# Patient Record
Sex: Male | Born: 1950 | Race: White | Hispanic: No | Marital: Married | Smoking: Current every day smoker
Health system: Southern US, Community
[De-identification: ages and names within clinical notes are randomized; demographics above are authoritative.]

## PROBLEM LIST (undated history)

## (undated) DIAGNOSIS — I10 Essential (primary) hypertension: Secondary | ICD-10-CM

## (undated) DIAGNOSIS — J45909 Unspecified asthma, uncomplicated: Secondary | ICD-10-CM

## (undated) DIAGNOSIS — M109 Gout, unspecified: Secondary | ICD-10-CM

## (undated) DIAGNOSIS — F32A Depression, unspecified: Secondary | ICD-10-CM

## (undated) DIAGNOSIS — F329 Major depressive disorder, single episode, unspecified: Secondary | ICD-10-CM

## (undated) DIAGNOSIS — F419 Anxiety disorder, unspecified: Secondary | ICD-10-CM

## (undated) HISTORY — DX: Depression, unspecified: F32.A

## (undated) HISTORY — DX: Essential (primary) hypertension: I10

## (undated) HISTORY — DX: Anxiety disorder, unspecified: F41.9

## (undated) HISTORY — DX: Unspecified asthma, uncomplicated: J45.909

## (undated) HISTORY — PX: APPENDECTOMY: SHX54

## (undated) HISTORY — DX: Major depressive disorder, single episode, unspecified: F32.9

## (undated) HISTORY — PX: TONSILLECTOMY: SUR1361

## (undated) HISTORY — DX: Gout, unspecified: M10.9

---

## 2001-10-25 ENCOUNTER — Encounter: Payer: Self-pay | Admitting: Family Medicine

## 2001-10-25 ENCOUNTER — Encounter: Admission: RE | Admit: 2001-10-25 | Discharge: 2001-10-25 | Payer: Self-pay | Admitting: Family Medicine

## 2002-09-09 ENCOUNTER — Emergency Department (HOSPITAL_COMMUNITY): Admission: EM | Admit: 2002-09-09 | Discharge: 2002-09-09 | Payer: Self-pay | Admitting: Emergency Medicine

## 2002-09-09 ENCOUNTER — Encounter: Payer: Self-pay | Admitting: Emergency Medicine

## 2002-12-23 ENCOUNTER — Emergency Department (HOSPITAL_COMMUNITY): Admission: EM | Admit: 2002-12-23 | Discharge: 2002-12-23 | Payer: Self-pay | Admitting: Emergency Medicine

## 2002-12-23 ENCOUNTER — Encounter: Payer: Self-pay | Admitting: Emergency Medicine

## 2003-02-05 ENCOUNTER — Ambulatory Visit (HOSPITAL_COMMUNITY): Admission: RE | Admit: 2003-02-05 | Discharge: 2003-02-05 | Payer: Self-pay | Admitting: Podiatry

## 2003-02-05 ENCOUNTER — Encounter: Payer: Self-pay | Admitting: Podiatry

## 2003-12-10 ENCOUNTER — Other Ambulatory Visit: Payer: Self-pay

## 2009-08-31 ENCOUNTER — Inpatient Hospital Stay: Payer: Self-pay | Admitting: Internal Medicine

## 2010-03-10 ENCOUNTER — Ambulatory Visit: Payer: Self-pay | Admitting: Ophthalmology

## 2010-03-24 ENCOUNTER — Ambulatory Visit: Payer: Self-pay | Admitting: Ophthalmology

## 2011-04-29 ENCOUNTER — Ambulatory Visit: Payer: Self-pay | Admitting: Family Medicine

## 2011-11-17 ENCOUNTER — Ambulatory Visit: Payer: Self-pay | Admitting: Family Medicine

## 2012-09-08 ENCOUNTER — Telehealth: Payer: Self-pay | Admitting: Internal Medicine

## 2012-09-20 NOTE — Telephone Encounter (Signed)
x

## 2015-04-21 ENCOUNTER — Ambulatory Visit: Payer: Self-pay | Admitting: Family Medicine

## 2015-04-24 ENCOUNTER — Ambulatory Visit (INDEPENDENT_AMBULATORY_CARE_PROVIDER_SITE_OTHER): Payer: BLUE CROSS/BLUE SHIELD | Admitting: Family Medicine

## 2015-04-24 ENCOUNTER — Encounter: Payer: Self-pay | Admitting: Family Medicine

## 2015-04-24 VITALS — BP 136/94 | HR 102 | Temp 98.2°F | Resp 18 | Ht 71.0 in | Wt 272.2 lb

## 2015-04-24 DIAGNOSIS — I10 Essential (primary) hypertension: Secondary | ICD-10-CM | POA: Diagnosis not present

## 2015-04-24 NOTE — Patient Instructions (Signed)
DASH Eating Plan °DASH stands for "Dietary Approaches to Stop Hypertension." The DASH eating plan is a healthy eating plan that has been shown to reduce high blood pressure (hypertension). Additional health benefits may include reducing the risk of type 2 diabetes mellitus, heart disease, and stroke. The DASH eating plan may also help with weight loss. °WHAT DO I NEED TO KNOW ABOUT THE DASH EATING PLAN? °For the DASH eating plan, you will follow these general guidelines: °· Choose foods with a percent daily value for sodium of less than 5% (as listed on the food label). °· Use salt-free seasonings or herbs instead of table salt or sea salt. °· Check with your health care provider or pharmacist before using salt substitutes. °· Eat lower-sodium products, often labeled as "lower sodium" or "no salt added." °· Eat fresh foods. °· Eat more vegetables, fruits, and low-fat dairy products. °· Choose whole grains. Look for the word "whole" as the first word in the ingredient list. °· Choose fish and skinless chicken or turkey more often than red meat. Limit fish, poultry, and meat to 6 oz (170 g) each day. °· Limit sweets, desserts, sugars, and sugary drinks. °· Choose heart-healthy fats. °· Limit cheese to 1 oz (28 g) per day. °· Eat more home-cooked food and less restaurant, buffet, and fast food. °· Limit fried foods. °· Cook foods using methods other than frying. °· Limit canned vegetables. If you do use them, rinse them well to decrease the sodium. °· When eating at a restaurant, ask that your food be prepared with less salt, or no salt if possible. °WHAT FOODS CAN I EAT? °Seek help from a dietitian for individual calorie needs. °Grains °Whole grain or whole wheat bread. Brown rice. Whole grain or whole wheat pasta. Quinoa, bulgur, and whole grain cereals. Low-sodium cereals. Corn or whole wheat flour tortillas. Whole grain cornbread. Whole grain crackers. Low-sodium crackers. °Vegetables °Fresh or frozen vegetables  (raw, steamed, roasted, or grilled). Low-sodium or reduced-sodium tomato and vegetable juices. Low-sodium or reduced-sodium tomato sauce and paste. Low-sodium or reduced-sodium canned vegetables.  °Fruits °All fresh, canned (in natural juice), or frozen fruits. °Meat and Other Protein Products °Ground beef (85% or leaner), grass-fed beef, or beef trimmed of fat. Skinless chicken or turkey. Ground chicken or turkey. Pork trimmed of fat. All fish and seafood. Eggs. Dried beans, peas, or lentils. Unsalted nuts and seeds. Unsalted canned beans. °Dairy °Low-fat dairy products, such as skim or 1% milk, 2% or reduced-fat cheeses, low-fat ricotta or cottage cheese, or plain low-fat yogurt. Low-sodium or reduced-sodium cheeses. °Fats and Oils °Tub margarines without trans fats. Light or reduced-fat mayonnaise and salad dressings (reduced sodium). Avocado. Safflower, olive, or canola oils. Natural peanut or almond butter. °Other °Unsalted popcorn and pretzels. °The items listed above may not be a complete list of recommended foods or beverages. Contact your dietitian for more options. °WHAT FOODS ARE NOT RECOMMENDED? °Grains °White bread. White pasta. White rice. Refined cornbread. Bagels and croissants. Crackers that contain trans fat. °Vegetables °Creamed or fried vegetables. Vegetables in a cheese sauce. Regular canned vegetables. Regular canned tomato sauce and paste. Regular tomato and vegetable juices. °Fruits °Dried fruits. Canned fruit in light or heavy syrup. Fruit juice. °Meat and Other Protein Products °Fatty cuts of meat. Ribs, chicken wings, bacon, sausage, bologna, salami, chitterlings, fatback, hot dogs, bratwurst, and packaged luncheon meats. Salted nuts and seeds. Canned beans with salt. °Dairy °Whole or 2% milk, cream, half-and-half, and cream cheese. Whole-fat or sweetened yogurt. Full-fat   cheeses or blue cheese. Nondairy creamers and whipped toppings. Processed cheese, cheese spreads, or cheese  curds. °Condiments °Onion and garlic salt, seasoned salt, table salt, and sea salt. Canned and packaged gravies. Worcestershire sauce. Tartar sauce. Barbecue sauce. Teriyaki sauce. Soy sauce, including reduced sodium. Steak sauce. Fish sauce. Oyster sauce. Cocktail sauce. Horseradish. Ketchup and mustard. Meat flavorings and tenderizers. Bouillon cubes. Hot sauce. Tabasco sauce. Marinades. Taco seasonings. Relishes. °Fats and Oils °Butter, stick margarine, lard, shortening, ghee, and bacon fat. Coconut, palm kernel, or palm oils. Regular salad dressings. °Other °Pickles and olives. Salted popcorn and pretzels. °The items listed above may not be a complete list of foods and beverages to avoid. Contact your dietitian for more information. °WHERE CAN I FIND MORE INFORMATION? °[employer], [employer], [employer]: www.nhlbi.nih.gov/health/health-topics/topics/dash/ °Document Released: 10/14/2011 Document Revised: 03/11/2014 Document Reviewed: 08/29/2013 °Exit[employer]® Patient Information ©2015 [employer]. This information is not intended to replace advice given to you by your health care provider. Make sure you discuss any questions you have with your health care provider. ° °

## 2015-04-24 NOTE — Progress Notes (Signed)
BP 136/94 mmHg  Pulse 102  Temp(Src) 98.2 F (36.8 C)  Resp 18  Ht  (1.803 m)  Wt 272 lb 3.2 oz (123.469 kg)  BMI 37.98 kg/m2  SpO2 94%   Subjective:    Patient ID: Arthur Conner, male    DOB: May 05, 1951, 64 y.o.   MRN: [id number]  HPI: Arthur Conner is a 64 y.o. male  Chief Complaint  Patient presents with  . Hypertension    Patient states that blood pressure has been running 155/94. Patient states that he has recently gained 25lbs back.   HYPERTENSION- started increasing BP a couple of weeks ago, had some pain in his shoulder around that time. He was not taking any NSAIDs. No extra stress, no sudafed. Has gained about 25 pounds recently following dealing with his mother's estate. He also notes that he has not been monitoring his sodium intake and has been eating whatever he wants.  Hypertension status: exacerbated Satisfied with current treatment? no Duration of hypertension: chronic BP monitoring frequency:  daily BP range: 140s-150s BP medication side effects:  no Medication compliance: excellent compliance Aspirin: no Recurrent headaches: no Visual changes: no Palpitations: no Dyspnea: no Chest pain: no Lower extremity edema: no Dizzy/lightheaded: no  Relevant past medical, surgical, family and social history reviewed and updated as indicated. Interim medical history since our last visit reviewed. Allergies and medications reviewed and updated.  Review of Systems  Constitutional: Negative.   Respiratory: Negative.   Cardiovascular: Negative.   Skin: Negative.   Neurological: Negative.   Psychiatric/Behavioral: Negative.    Per HPI unless specifically indicated above     Objective:    BP 136/94 mmHg  Pulse 102  Temp(Src) 98.2 F (36.8 C)  Resp 18  Ht  (1.803 m)  Wt 272 lb 3.2 oz (123.469 kg)  BMI 37.98 kg/m2  SpO2 94%  Wt Readings from Last 3 Encounters:  04/24/15 272 lb 3.2 oz (123.469 kg)  09/20/14 264 lb (119.75 kg)     Physical Exam  Constitutional: He appears well-developed and well-nourished.  HENT:  Head: Normocephalic and atraumatic.  Neck: Normal range of motion. Neck supple.  No bruits  Cardiovascular: Normal rate, regular Arthur and normal heart sounds.  Exam reveals no gallop and no friction rub.   No murmur heard. Pulmonary/Chest: Effort normal and breath sounds normal. No respiratory distress. He has no wheezes. He has no rales. He exhibits no tenderness.  Skin: Skin is warm and dry.  Psychiatric: He has a normal mood and affect. His behavior is normal. Judgment and thought content normal.  Nursing note and vitals reviewed.   No results found for this or any previous visit.    Assessment & Plan:   Problem List Items Addressed This Visit      Cardiovascular and Mediastinum   HTN (hypertension) - Primary    Has been increasing recently, but has gained 25lbs and has not been watching his sodium intake. Will work on watching his diet and losing weight. Will recheck BP in 1 month to see how he is doing prior to adjusting medication. Discussed with patient the importance of calling if BP going higher when he continues to monitor. Will hold on labwork at this time as he doesn't think his thyroid is out of whack, but rather that he just hasn't been careful lately.           Follow up plan: Return in about 4 weeks (around 05/22/2015) for for BP  follow up with [facility].

## 2015-04-24 NOTE — Assessment & Plan Note (Signed)
Has been increasing recently, but has gained 25lbs and has not been watching his sodium intake. Will work on watching his diet and losing weight. Will recheck BP in 1 month to see how he is doing prior to adjusting medication. Discussed with patient the importance of calling if BP going higher when he continues to monitor. Will hold on labwork at this time as he doesn't think his thyroid is out of whack, but rather that he just hasn't been careful lately.

## 2015-05-05 ENCOUNTER — Other Ambulatory Visit: Payer: Self-pay | Admitting: Family Medicine

## 2015-05-27 ENCOUNTER — Ambulatory Visit: Payer: BLUE CROSS/BLUE SHIELD | Admitting: Family Medicine

## 2015-06-02 ENCOUNTER — Other Ambulatory Visit: Payer: Self-pay | Admitting: Family Medicine

## 2015-07-24 ENCOUNTER — Other Ambulatory Visit: Payer: Self-pay | Admitting: Family Medicine

## 2015-07-29 ENCOUNTER — Encounter: Payer: Self-pay | Admitting: Family Medicine

## 2015-07-29 ENCOUNTER — Ambulatory Visit (INDEPENDENT_AMBULATORY_CARE_PROVIDER_SITE_OTHER): Payer: BLUE CROSS/BLUE SHIELD | Admitting: Family Medicine

## 2015-07-29 VITALS — BP 158/103 | HR 89 | Temp 99.2°F | Wt 281.0 lb

## 2015-07-29 DIAGNOSIS — I10 Essential (primary) hypertension: Secondary | ICD-10-CM

## 2015-07-29 DIAGNOSIS — J45901 Unspecified asthma with (acute) exacerbation: Secondary | ICD-10-CM

## 2015-07-29 MED ORDER — ALBUTEROL SULFATE HFA 108 (90 BASE) MCG/ACT IN AERS: 2.0000 | INHALATION_SPRAY | Freq: Four times a day (QID) | RESPIRATORY_TRACT | Status: DC | PRN

## 2015-07-29 MED ORDER — OLMESARTAN MEDOXOMIL 40 MG PO TABS: 40.0000 mg | ORAL_TABLET | Freq: Every day | ORAL | Status: DC

## 2015-07-29 MED ORDER — AMLODIPINE BESYLATE 10 MG PO TABS: 10.0000 mg | ORAL_TABLET | Freq: Every day | ORAL | Status: DC

## 2015-07-29 MED ORDER — HYDROCHLOROTHIAZIDE 25 MG PO TABS: 25.0000 mg | ORAL_TABLET | Freq: Every day | ORAL | Status: DC

## 2015-07-29 MED ORDER — BUDESONIDE-FORMOTEROL FUMARATE 160-4.5 MCG/ACT IN AERO: 2.0000 | INHALATION_SPRAY | Freq: Two times a day (BID) | RESPIRATORY_TRACT | Status: DC

## 2015-07-29 MED ORDER — LEVOFLOXACIN 750 MG PO TABS: 750.0000 mg | ORAL_TABLET | Freq: Every day | ORAL | Status: DC

## 2015-07-29 MED ORDER — PREDNISONE 10 MG PO TABS: ORAL_TABLET | ORAL | Status: DC

## 2015-07-29 NOTE — Assessment & Plan Note (Signed)
Likely up due to work of breathing. Rx for tribenzor broken up due to cost.

## 2015-07-29 NOTE — Progress Notes (Signed)
BP 158/103 mmHg  Pulse 89  Temp(Src) 99.2 F (37.3 C)  Wt 281 lb (127.461 kg)  SpO2 97%   Subjective:    Patient ID: Arthur Conner, male    DOB: 11/13/1950, 64 y.o.   MRN: [id number]  HPI: Arthur Conner is a 64 y.o. male  Chief Complaint  Patient presents with  . Asthma    patient states that he is having an asthma flare, he had some prednisone that [facility] had written that he did not take, so he has been using that, he took the last 1/2 pill this morning.He states that he needs a refill on proventil and either Symbicort or Advair   ASTHMA- has been sick for 2 weeks, finished a prednisone taper this morning, feeling a little better, but not much, wheezing when he lays down. Has been out of his advair/symbicort for unknown amount of time. Has nevre had symbicort for long periods of time, but took an old dose of his mothers Asthma status: exacerbated Satisfied with current treatment?: no Albuterol/rescue inhaler frequency: daily  Dyspnea frequency: all the time Wheezing frequency: nightly Cough frequency: occasionally Nocturnal symptom frequency: nightly Limitation of activity: yes Current upper respiratory symptoms: yes Triggers: allergies, uknown Home peak flows: no Last Spirometry: refused today Failed/intolerant to following asthma meds: none Asthma meds in past: advair Aerochamber/spacer use: no Visits to ER or Urgent Care in past year: no Pneumovax: Up to Date Influenza: postponed until better   Relevant past medical, surgical, family and social history reviewed and updated as indicated. Interim medical history since our last visit reviewed. Allergies and medications reviewed and updated.  Review of Systems  Constitutional: Negative.   HENT: Negative.   Respiratory: Positive for cough, chest tightness, shortness of breath and wheezing. Negative for apnea, choking and stridor.   Cardiovascular: Negative for chest pain, palpitations and leg swelling.   Psychiatric/Behavioral: Negative.     Per HPI unless specifically indicated above     Objective:    BP 158/103 mmHg  Pulse 89  Temp(Src) 99.2 F (37.3 C)  Wt 281 lb (127.461 kg)  SpO2 97%  Wt Readings from Last 3 Encounters:  07/29/15 281 lb (127.461 kg)  04/24/15 272 lb 3.2 oz (123.469 kg)  09/20/14 264 lb (119.75 kg)    Physical Exam  Constitutional: He is oriented to person, place, and time. He appears well-developed and well-nourished. No distress.  HENT:  Head: Normocephalic and atraumatic.  Right Ear: Hearing normal.  Left Ear: Hearing normal.  Nose: Nose normal.  Eyes: Conjunctivae and lids are normal. Right eye exhibits no discharge. Left eye exhibits no discharge. No scleral icterus.  Cardiovascular: Normal rate, regular rhythm, normal heart sounds and intact distal pulses.  Exam reveals no gallop and no friction rub.   No murmur heard. Pulmonary/Chest: Effort normal. No respiratory distress. He has decreased breath sounds. He has wheezes in the right upper field, the right middle field, the right lower field, the left upper field, the left middle field and the left lower field. He has rhonchi in the right upper field, the right middle field, the right lower field, the left upper field, the left middle field and the left lower field. He has no rales. He exhibits no tenderness.  Musculoskeletal: Normal range of motion.  Neurological: He is alert and oriented to person, place, and time.  Skin: Skin is intact. No rash noted.  Psychiatric: He has a normal mood and affect. His speech is normal and behavior is  normal. Judgment and thought content normal. Cognition and memory are normal.  Nursing note and vitals reviewed.   No results found for this or any previous visit.    Assessment & Plan:   Problem List Items Addressed This Visit      Cardiovascular and Mediastinum   HTN (hypertension)    Likely up due to work of breathing. Rx for tribenzor broken up due to cost.        Relevant Medications   olmesartan (BENICAR) 40 MG tablet   amLODipine (NORVASC) 10 MG tablet   hydrochlorothiazide (HYDRODIURIL) 25 MG tablet     Respiratory   Asthma exacerbation - Primary    Not doing well. 14 days of levaquin. 12 day taper of prednisone as he already completed one. Refused neb and spirometry today. Refills on his inhalers given today. Recheck on lungs in 2 weeks.       Relevant Medications   budesonide-formoterol (SYMBICORT) 160-4.5 MCG/ACT inhaler   albuterol (PROVENTIL HFA;VENTOLIN HFA) 108 (90 BASE) MCG/ACT inhaler   predniSONE (DELTASONE) 10 MG tablet       Follow up plan: Return in about 2 weeks (around 08/12/2015).

## 2015-07-29 NOTE — Assessment & Plan Note (Signed)
Not doing well. 14 days of levaquin. 12 day taper of prednisone as he already completed one. Refused neb and spirometry today. Refills on his inhalers given today. Recheck on lungs in 2 weeks.

## 2015-08-12 ENCOUNTER — Ambulatory Visit (INDEPENDENT_AMBULATORY_CARE_PROVIDER_SITE_OTHER): Payer: BLUE CROSS/BLUE SHIELD | Admitting: Family Medicine

## 2015-08-12 ENCOUNTER — Encounter: Payer: Self-pay | Admitting: Family Medicine

## 2015-08-12 VITALS — BP 136/86 | HR 96 | Temp 98.5°F | Ht 71.0 in | Wt 286.0 lb

## 2015-08-12 DIAGNOSIS — I10 Essential (primary) hypertension: Secondary | ICD-10-CM | POA: Diagnosis not present

## 2015-08-12 DIAGNOSIS — E039 Hypothyroidism, unspecified: Secondary | ICD-10-CM | POA: Diagnosis not present

## 2015-08-12 DIAGNOSIS — J45901 Unspecified asthma with (acute) exacerbation: Secondary | ICD-10-CM | POA: Diagnosis not present

## 2015-08-12 DIAGNOSIS — Z23 Encounter for immunization: Secondary | ICD-10-CM | POA: Diagnosis not present

## 2015-08-12 MED ORDER — LOSARTAN POTASSIUM 100 MG PO TABS: 100.0000 mg | ORAL_TABLET | Freq: Every day | ORAL | Status: DC

## 2015-08-12 NOTE — Assessment & Plan Note (Addendum)
For blood pressure we'll discontinue Benicar start losartan Will recheck in 1 month to assess results and change Will need to check BMP also

## 2015-08-12 NOTE — Assessment & Plan Note (Signed)
Resolving with meds needs refills Stay of cigs and etoh as pt is doing

## 2015-08-12 NOTE — Progress Notes (Signed)
BP 136/86 mmHg  Pulse 96  Temp(Src) 98.5 F (36.9 C)  Ht  (1.803 m)  Wt 286 lb (129.729 kg)  BMI 39.91 kg/m2  SpO2 96%   Subjective:    Patient ID: Arthur Conner, male    DOB: [birth year], 64 y.o.   MRN: [id number]  HPI: Arthur Conner is a 64 y.o. male  Chief Complaint  Patient presents with  . Asthma   patient was stormy course over the last month with coughing and wheezing to prednisone and antibiotics to finally get better. Now doing better Needing refill on medications Still coughing some no real wheezing On review patient not drinking alcohol or smoking. Concerned about having taken Levaquin and side effects of patient with no signs or symptoms.   Relevant past medical, surgical, family and social history reviewed and updated as indicated. Interim medical history since our last visit reviewed. Allergies and medications reviewed and updated.  Blood pressures been doing well with medications No side effects from medications takes faithfully  Review of Systems  Constitutional: Negative.   Respiratory: Positive for cough. Negative for apnea, choking, chest tightness, shortness of breath and wheezing.   Cardiovascular: Negative.     Per HPI unless specifically indicated above     Objective:    BP 136/86 mmHg  Pulse 96  Temp(Src) 98.5 F (36.9 C)  Ht  (1.803 m)  Wt 286 lb (129.729 kg)  BMI 39.91 kg/m2  SpO2 96%  Wt Readings from Last 3 Encounters:  08/12/15 286 lb (129.729 kg)  07/29/15 281 lb (127.461 kg)  04/24/15 272 lb 3.2 oz (123.469 kg)    Physical Exam  Constitutional: He is oriented to person, place, and time. He appears well-developed and well-nourished. No distress.  HENT:  Head: Normocephalic and atraumatic.  Right Ear: Hearing normal.  Left Ear: Hearing normal.  Nose: Nose normal.  Eyes: Conjunctivae and lids are normal. Right eye exhibits no discharge. Left eye exhibits no discharge. No scleral icterus.  Cardiovascular:  Normal rate, regular rhythm and normal heart sounds.   Pulmonary/Chest: Effort normal. No respiratory distress.  Some rhonchi no rales or wheeze  Musculoskeletal: Normal range of motion.  Neurological: He is alert and oriented to person, place, and time.  Skin: Skin is intact. No rash noted.  Psychiatric: He has a normal mood and affect. His speech is normal and behavior is normal. Judgment and thought content normal. Cognition and memory are normal.    No results found for this or any previous visit.    Assessment & Plan:   Problem List Items Addressed This Visit      Cardiovascular and Mediastinum   Essential hypertension    For blood pressure we'll discontinue Benicar start losartan Will recheck in 1 month to assess results and change Will need to check BMP also       Relevant Medications   losartan (COZAAR) 100 MG tablet     Respiratory   Asthma exacerbation    Resolving with meds needs refills Stay of cigs and etoh as pt is doing        Endocrine   Hypothyroidism    Other Visit Diagnoses    Immunization due    -  Primary    Relevant Orders    Flu Vaccine QUAD 36+ mos PF IM (Fluarix & Fluzone Quad PF) (Completed)        Follow up plan: Return in about 4 weeks (around 09/09/2015) for Blood pressure check, BMP,  EKG.

## 2015-09-06 ENCOUNTER — Other Ambulatory Visit: Payer: Self-pay | Admitting: Family Medicine

## 2015-09-08 NOTE — Telephone Encounter (Signed)
Your patient 

## 2015-09-09 ENCOUNTER — Ambulatory Visit: Payer: BLUE CROSS/BLUE SHIELD | Admitting: Family Medicine

## 2015-09-10 ENCOUNTER — Encounter: Payer: Self-pay | Admitting: Family Medicine

## 2015-09-10 ENCOUNTER — Telehealth: Payer: Self-pay | Admitting: Family Medicine

## 2015-09-10 ENCOUNTER — Ambulatory Visit (INDEPENDENT_AMBULATORY_CARE_PROVIDER_SITE_OTHER): Payer: BLUE CROSS/BLUE SHIELD | Admitting: Family Medicine

## 2015-09-10 VITALS — BP 133/87 | HR 90 | Temp 98.6°F | Ht 72.6 in | Wt 287.0 lb

## 2015-09-10 DIAGNOSIS — J45901 Unspecified asthma with (acute) exacerbation: Secondary | ICD-10-CM | POA: Diagnosis not present

## 2015-09-10 DIAGNOSIS — I10 Essential (primary) hypertension: Secondary | ICD-10-CM | POA: Diagnosis not present

## 2015-09-10 MED ORDER — LOSARTAN POTASSIUM 100 MG PO TABS: 100.0000 mg | ORAL_TABLET | Freq: Every day | ORAL | Status: DC

## 2015-09-10 MED ORDER — UMECLIDINIUM-VILANTEROL 62.5-25 MCG/INH IN AEPB: 1.0000 | INHALATION_SPRAY | Freq: Every day | RESPIRATORY_TRACT | Status: DC

## 2015-09-10 NOTE — Telephone Encounter (Signed)
Call [facility] and discontinue Symbicort. I thought the patient understood I was replacing his Symbicort with Anoro

## 2015-09-10 NOTE — Telephone Encounter (Signed)
anoro and symbicort aren't generally taken together and the pharmacy would like clarification.

## 2015-09-10 NOTE — Progress Notes (Signed)
   BP 133/87 mmHg  Pulse 90  Temp(Src) 98.6 F (37 C)  Ht 6' 0.6" (1.844 m)  Wt 287 lb (130.182 kg)  BMI 38.29 kg/m2  SpO2 96%   Subjective:    Patient ID: Arthur Conner, male    DOB: [birth year], 64 y.o.   MRN: [id number]  HPI: Arthur Conner is a 64 y.o. male  Chief Complaint  Patient presents with  . Hypertension   blood pressure doing well on change medications no side effects taking medications faithfully  Symbicort with insurance changes gotten very expensive wondering if something else would work as well and less expensive Breathing after above episode is back to normal and doing well.  Relevant past medical, surgical, family and social history reviewed and updated as indicated. Interim medical history since our last visit reviewed. Allergies and medications reviewed and updated.  Review of Systems  Constitutional: Negative.   Respiratory: Negative.   Cardiovascular: Negative.     Per HPI unless specifically indicated above     Objective:    BP 133/87 mmHg  Pulse 90  Temp(Src) 98.6 F (37 C)  Ht 6' 0.6" (1.844 m)  Wt 287 lb (130.182 kg)  BMI 38.29 kg/m2  SpO2 96%  Wt Readings from Last 3 Encounters:  09/10/15 287 lb (130.182 kg)  08/12/15 286 lb (129.729 kg)  07/29/15 281 lb (127.461 kg)    Physical Exam  Constitutional: He is oriented to person, place, and time. He appears well-developed and well-nourished. No distress.  HENT:  Head: Normocephalic and atraumatic.  Right Ear: Hearing normal.  Left Ear: Hearing normal.  Nose: Nose normal.  Eyes: Conjunctivae and lids are normal. Right eye exhibits no discharge. Left eye exhibits no discharge. No scleral icterus.  Cardiovascular: Normal rate, regular rhythm and normal heart sounds.   Pulmonary/Chest: Effort normal and breath sounds normal. No respiratory distress.  Musculoskeletal: Normal range of motion.  Neurological: He is alert and oriented to person, place, and time.  Skin: Skin is intact.  No rash noted.  Psychiatric: He has a normal mood and affect. His speech is normal and behavior is normal. Judgment and thought content normal. Cognition and memory are normal.   EKG normal sinus rhythm and normal EKG  No results found for this or any previous visit.    Assessment & Plan:   Problem List Items Addressed This Visit      Respiratory   Asthma exacerbation    Stable and improved Will switch from Symbicort to Anoro      Relevant Medications   Umeclidinium-Vilanterol (ANORO ELLIPTA) 62.5-25 MCG/INH AEPB    Other Visit Diagnoses    Essential hypertension, benign    -  Primary    Relevant Medications    losartan (COZAAR) 100 MG tablet    Other Relevant Orders    Basic metabolic panel    EKG 12-Lead (Completed)        Follow up plan: Return in about 3 months (around 12/11/2015) for Physical Exam February.

## 2015-09-10 NOTE — Assessment & Plan Note (Signed)
Patient doing well on current blood pressure medications with normal EKG and continue new medications and check BMP today

## 2015-09-10 NOTE — Assessment & Plan Note (Signed)
Stable and improved Will switch from Symbicort to [facility] Energynoro

## 2015-09-11 ENCOUNTER — Telehealth: Payer: Self-pay | Admitting: Family Medicine

## 2015-09-11 LAB — BASIC METABOLIC PANEL
BUN / CREAT RATIO: 10 (ref 10–22)
BUN: 10 mg/dL (ref 8–27)
CO2: 22 mmol/L (ref 18–29)
Calcium: 9.6 mg/dL (ref 8.6–10.2)
Chloride: 102 mmol/L (ref 97–106)
Creatinine, Ser: 1.02 mg/dL (ref 0.76–1.27)
GFR, EST AFRICAN AMERICAN: 90 mL/min/{1.73_m2} (ref >59)
GFR, EST NON AFRICAN AMERICAN: 78 mL/min/{1.73_m2} (ref >59)
Glucose: 128 mg/dL — ABNORMAL HIGH (ref 65–99)
POTASSIUM: 4.7 mmol/L (ref 3.5–5.2)
SODIUM: 143 mmol/L (ref 136–144)

## 2015-09-11 NOTE — Telephone Encounter (Signed)
Can we call him and ask if he ate before he had his blood work done. If he did- everything was normal. If he didn't, can we get him back in for lab visit for an A1c. Thanks!

## 2015-09-11 NOTE — Telephone Encounter (Signed)
I spoke with patient, he states he was NOT fasting. He had ate before his appt.

## 2015-09-11 NOTE — Telephone Encounter (Signed)
Pharmacy notified to discontinue Symbicort for patient.

## 2015-09-15 ENCOUNTER — Encounter: Payer: Self-pay | Admitting: Family Medicine

## 2015-10-11 ENCOUNTER — Other Ambulatory Visit: Payer: Self-pay | Admitting: Family Medicine

## 2015-10-13 NOTE — Telephone Encounter (Signed)
Your patient 

## 2015-10-31 ENCOUNTER — Encounter: Payer: Self-pay | Admitting: Family Medicine

## 2015-10-31 ENCOUNTER — Ambulatory Visit (INDEPENDENT_AMBULATORY_CARE_PROVIDER_SITE_OTHER): Payer: BLUE CROSS/BLUE SHIELD | Admitting: Family Medicine

## 2015-10-31 VITALS — BP 130/80 | HR 101 | Temp 98.0°F | Resp 16 | Ht 72.0 in | Wt 280.0 lb

## 2015-10-31 DIAGNOSIS — J4520 Mild intermittent asthma, uncomplicated: Secondary | ICD-10-CM

## 2015-10-31 DIAGNOSIS — J45909 Unspecified asthma, uncomplicated: Secondary | ICD-10-CM | POA: Insufficient documentation

## 2015-10-31 MED ORDER — LEVOFLOXACIN 500 MG PO TABS: 500.0000 mg | ORAL_TABLET | Freq: Every day | ORAL | Status: DC

## 2015-10-31 MED ORDER — PREDNISONE 10 MG PO TABS: ORAL_TABLET | ORAL | Status: DC

## 2015-10-31 NOTE — Progress Notes (Signed)
Name: Arthur Conner   MRN: [id number]    DOB: 1951/02/24   Date:10/31/2015       Progress Note  Subjective  Chief Complaint  Chief Complaint  Patient presents with  . Asthma    Flare up pt had house clearning from top seiling alllergic to dust and ahd augumentin for 5 days 2 tabs for 5 days last was yesterday and has taken allergy shot for 30 years     HPI C/o breathing problems with cough productive of green sputum x 3-4 days.  Took some left over Augmentin x 2 days and this has not helped.  Started with dusting in home. No fever.  Has congestion in sinuses and chest.  Augmentin has not helped.   No problem-specific assessment & plan notes found for this encounter.   Past Medical History  Diagnosis Date  . Depression   . Anxiety   . Asthma   . Gout     Social History  Substance Use Topics  . Smoking status: Former Smoker    Types: Cigarettes    Quit date: 08/11/1990  . Smokeless tobacco: Never Used  . Alcohol Use: 0.0 oz/week    0 Standard drinks or equivalent per week     Comment: occasionally     Current outpatient prescriptions:  .  albuterol (PROVENTIL) (2.5 MG/3ML) 0.083% nebulizer solution, INHALE CONTENTS OF 1 VIAL BY MOUTH THROUGH NEBULIZER EVERY 4 TO 6 HOURS AS NEEDED FOR SHORTNESS OF BREATH., Disp: 300 mL, Rfl: 12 .  allopurinol (ZYLOPRIM) 300 MG tablet, Take 300 mg by mouth daily., Disp: , Rfl:  .  amLODipine (NORVASC) 10 MG tablet, Take 1 tablet (10 mg total) by mouth daily., Disp: 30 tablet, Rfl: 6 .  cetirizine (ZYRTEC) 10 MG tablet, once daily., Disp: , Rfl:  .  esomeprazole (NEXIUM) 40 MG capsule, Take 40 mg by mouth daily at 12 noon., Disp: , Rfl:  .  hydrochlorothiazide (HYDRODIURIL) 25 MG tablet, Take 1 tablet (25 mg total) by mouth daily., Disp: 30 tablet, Rfl: 6 .  levothyroxine (SYNTHROID, LEVOTHROID) 100 MCG tablet, once daily. Take on an empty stomach with a glass of water at least 30-60 minutes before breakfast., Disp: , Rfl:  .  losartan  (COZAAR) 100 MG tablet, Take 1 tablet (100 mg total) by mouth daily., Disp: 30 tablet, Rfl: 6 .  mupirocin ointment (BACTROBAN) 2 %, Place 1 application into the nose 2 (two) times daily., Disp: , Rfl:  .  nystatin (MYCOSTATIN) powder, , Disp: , Rfl: 5 .  PROVENTIL HFA 108 (90 BASE) MCG/ACT inhaler, INHALE 2 PUFFS BY MOUTH EVERY 6 HOURS AS NEEDED FOR WHEEZING OR SHORTNESS OF BREATH., Disp: 6.7 g, Rfl: 12 .  triamcinolone cream (KENALOG) 0.1 %, Apply 1 application topically 2 (two) times daily as needed., Disp: , Rfl:  .  Umeclidinium-Vilanterol (ANORO ELLIPTA) 62.5-25 MCG/INH AEPB, Inhale 1 Inhaler into the lungs daily., Disp: 1 each, Rfl: 12 .  aliskiren (TEKTURNA) 300 MG tablet, Reported on 10/31/2015, Disp: , Rfl:  .  omeprazole (PRILOSEC) 10 MG capsule, Reported on 10/31/2015, Disp: , Rfl:   Allergies  Allergen Reactions  . Toprol Xl [Metoprolol Tartrate] Other (See Comments)    Bradycardia    Review of Systems  Constitutional: Negative for fever, chills, weight loss and malaise/fatigue.  HENT: Positive for congestion. Negative for hearing loss.   Eyes: Negative for blurred vision and double vision.  Respiratory: Positive for cough, shortness of breath and wheezing.   Cardiovascular: Negative  for chest pain, palpitations and leg swelling.  Gastrointestinal: Negative for abdominal pain and blood in stool.  Genitourinary: Negative for dysuria, urgency and frequency.  Neurological: Negative for weakness and headaches.      Objective  Filed Vitals:   10/31/15 1025  BP: 153/104  Pulse: 101  Temp: 98 F (36.7 C)  TempSrc: Oral  Resp: 16  Height: 6' (1.829 m)  Weight: 280 lb (127.007 kg)  SpO2: 96%     Physical Exam  Constitutional: He is well-developed, well-nourished, and in no distress. No distress.  HENT:  Head: Normocephalic and atraumatic.  Right Ear: External ear normal.  Left Ear: External ear normal.  Nose: Rhinorrhea present. No mucosal edema. Right sinus  exhibits no maxillary sinus tenderness and no frontal sinus tenderness. Left sinus exhibits no maxillary sinus tenderness and no frontal sinus tenderness.  Mouth/Throat: Oropharynx is clear and moist.  Neck: Normal range of motion. Neck supple. No thyromegaly present.  Cardiovascular: Normal rate, regular rhythm and normal heart sounds.  Exam reveals no gallop and no friction rub.   No murmur heard. Pulmonary/Chest: Effort normal. No respiratory distress. He has wheezes. He has no rales.  Coarse breath sounds throughout.  Musculoskeletal: He exhibits no edema.  Lymphadenopathy:    He has no cervical adenopathy.  Vitals reviewed.     Recent Results (from the past 2160 hour(s))  Basic metabolic panel     Status: Abnormal   Collection Time: 09/10/15 10:40 AM  Result Value Ref Range   Glucose 128 (H) 65 - 99 mg/dL   BUN 10 8 - 27 mg/dL   Creatinine, Ser 1.47 0.76 - 1.27 mg/dL   GFR calc non Af Amer 78 >59 mL/min/1.73   GFR calc Af Amer 90 >59 mL/min/1.73   BUN/Creatinine Ratio 10 10 - 22   Sodium 143 136 - 144 mmol/L   Potassium 4.7 3.5 - 5.2 mmol/L   Chloride 102 97 - 106 mmol/L   CO2 22 18 - 29 mmol/L   Calcium 9.6 8.6 - 10.2 mg/dL     Assessment & Plan  1. Asthmatic bronchitis, mild intermittent, uncomplicated  - levofloxacin (LEVAQUIN) 500 MG tablet; Take 1 tablet (500 mg total) by mouth daily.  Dispense: 7 tablet; Refill: 0 - predniSONE (DELTASONE) 10 MG tablet; Take 3 tablets a day for 2 days, then 2 a day for 2 days, then 1 a day for 2 days. (3,3,2,2,1,1,)  Dispense: 120 tablet; Refill: 0

## 2015-11-06 ENCOUNTER — Ambulatory Visit (INDEPENDENT_AMBULATORY_CARE_PROVIDER_SITE_OTHER): Payer: BLUE CROSS/BLUE SHIELD | Admitting: Unknown Physician Specialty

## 2015-11-06 ENCOUNTER — Encounter: Payer: Self-pay | Admitting: Unknown Physician Specialty

## 2015-11-06 ENCOUNTER — Other Ambulatory Visit: Payer: Self-pay | Admitting: Unknown Physician Specialty

## 2015-11-06 VITALS — BP 147/96 | HR 103 | Temp 98.2°F | Ht 71.0 in | Wt 285.4 lb

## 2015-11-06 DIAGNOSIS — J4541 Moderate persistent asthma with (acute) exacerbation: Secondary | ICD-10-CM

## 2015-11-06 MED ORDER — PREDNISONE 20 MG PO TABS: 20.0000 mg | ORAL_TABLET | Freq: Every day | ORAL | Status: DC

## 2015-11-06 MED ORDER — DOXYCYCLINE HYCLATE 100 MG PO TABS: 100.0000 mg | ORAL_TABLET | Freq: Two times a day (BID) | ORAL | Status: DC

## 2015-11-06 NOTE — Progress Notes (Signed)
BP 147/96 mmHg  Pulse 103  Temp(Src) 98.2 F (36.8 C)  Ht  (1.803 m)  Wt 285 lb 6.4 oz (129.457 kg)  BMI 39.82 kg/m2  SpO2 95%   Subjective:    Patient ID: Arthur Conner, male    DOB: [birth year], 64 y.o.   MRN: [id number]  HPI: Arthur Conner is a 64 y.o. male  Chief Complaint  Patient presents with  . Asthma    pt states he has had asthma all his life but his house has recently been cleaned, and he came in contact with dust  . Medication Refill    pt states he needs refill on proventil    Asthma He complains of chest tightness, cough, shortness of breath, sputum production and wheezing. Primary symptoms comments: Went to see Dr Juanetta Gosling last week and got a short burst of prednisone and 500 mg of Levaquin for 7 days. The current episode started 1 to 4 weeks ago. The problem occurs constantly. The problem has been gradually worsening. The cough is productive of sputum and hacking. Associated symptoms include dyspnea on exertion, malaise/fatigue, nasal congestion, postnasal drip and a sore throat. His symptoms are aggravated by nothing. His symptoms are alleviated by nothing. He reports no improvement on treatment. His past medical history is significant for asthma.     Relevant past medical, surgical, family and social history reviewed and updated as indicated. Interim medical history since our last visit reviewed. Allergies and medications reviewed and updated.  Review of Systems  Constitutional: Positive for malaise/fatigue.  HENT: Positive for postnasal drip and sore throat.   Respiratory: Positive for cough, sputum production, shortness of breath and wheezing.   Cardiovascular: Positive for dyspnea on exertion.    Per HPI unless specifically indicated above     Objective:    BP 147/96 mmHg  Pulse 103  Temp(Src) 98.2 F (36.8 C)  Ht  (1.803 m)  Wt 285 lb 6.4 oz (129.457 kg)  BMI 39.82 kg/m2  SpO2 95%  Wt Readings from Last 3 Encounters:   11/06/15 285 lb 6.4 oz (129.457 kg)  10/31/15 280 lb (127.007 kg)  09/10/15 287 lb (130.182 kg)    Physical Exam  Constitutional: He is oriented to person, place, and time. He appears well-developed and well-nourished. No distress.  HENT:  Head: Normocephalic and atraumatic.  Right Ear: Tympanic membrane and ear canal normal.  Left Ear: Tympanic membrane and ear canal normal.  Nose: Rhinorrhea present. Right sinus exhibits no maxillary sinus tenderness and no frontal sinus tenderness. Left sinus exhibits no maxillary sinus tenderness and no frontal sinus tenderness.  Mouth/Throat: Uvula is midline. Posterior oropharyngeal edema present.  Eyes: Conjunctivae and lids are normal. Right eye exhibits no discharge. Left eye exhibits no discharge. No scleral icterus.  Neck: Neck supple.  Cardiovascular: Normal rate, regular rhythm and normal heart sounds.   Pulmonary/Chest: Effort normal. No respiratory distress. He has wheezes in the right middle field, the right lower field and the left lower field. He has rhonchi in the right middle field, the right lower field and the left lower field. He has rales in the right middle field, the right lower field and the left lower field.  Abdominal: Normal appearance. There is no splenomegaly or hepatomegaly.  Musculoskeletal: Normal range of motion.  Neurological: He is alert and oriented to person, place, and time.  Skin: Skin is warm, dry and intact. No rash noted. No pallor.  Psychiatric: He has a normal mood  and affect. His behavior is normal. Judgment and thought content normal.  Nursing note and vitals reviewed.   Results for orders placed or performed in visit on 09/10/15  Basic metabolic panel  Result Value Ref Range   Glucose 128 (H) 65 - 99 mg/dL   BUN 10 8 - 27 mg/dL   Creatinine, Ser 1.611.02 0.76 - 1.27 mg/dL   GFR calc non Af Amer 78 >59 mL/min/1.73   GFR calc Af Amer 90 >59 mL/min/1.73   BUN/Creatinine Ratio 10 10 - 22   Sodium 143 136 -  144 mmol/L   Potassium 4.7 3.5 - 5.2 mmol/L   Chloride 102 97 - 106 mmol/L   CO2 22 18 - 29 mmol/L   Calcium 9.6 8.6 - 10.2 mg/dL      Assessment & Plan:   Problem List Items Addressed This Visit    None    Visit Diagnoses    Acute asthma exacerbation, moderate persistent    -  Primary    Will rx with Doxycycline 100 mg.  BID.  continue with Anoro.  Will rx Prednisone 40 mg for 5 days.      Relevant Medications    predniSONE (DELTASONE) 20 MG tablet    Other Relevant Orders    DG Chest 2 View        Follow up plan: Return if symptoms worsen or fail to improve.

## 2015-11-12 ENCOUNTER — Ambulatory Visit: Payer: BLUE CROSS/BLUE SHIELD | Admitting: Unknown Physician Specialty

## 2015-11-28 ENCOUNTER — Other Ambulatory Visit: Payer: Self-pay | Admitting: Family Medicine

## 2015-12-02 ENCOUNTER — Other Ambulatory Visit: Payer: Self-pay | Admitting: Family Medicine

## 2015-12-15 ENCOUNTER — Encounter: Payer: BLUE CROSS/BLUE SHIELD | Admitting: Family Medicine

## 2015-12-16 ENCOUNTER — Encounter: Payer: BLUE CROSS/BLUE SHIELD | Admitting: Family Medicine

## 2015-12-22 ENCOUNTER — Encounter: Payer: Self-pay | Admitting: Family Medicine

## 2015-12-22 ENCOUNTER — Other Ambulatory Visit: Payer: Self-pay | Admitting: Family Medicine

## 2015-12-22 NOTE — Telephone Encounter (Signed)
I've made pt 2 appts in last couple weeks and he continues to cancel them.  I'll send a letter tomorrow.

## 2015-12-22 NOTE — Telephone Encounter (Signed)
Your patient 

## 2015-12-22 NOTE — Telephone Encounter (Signed)
apt 

## 2016-01-12 ENCOUNTER — Other Ambulatory Visit: Payer: Self-pay

## 2016-01-12 MED ORDER — LEVOTHYROXINE SODIUM 100 MCG PO TABS: ORAL_TABLET | ORAL | Status: DC

## 2016-01-12 NOTE — Telephone Encounter (Signed)
Refill request from [facility]

## 2016-01-26 NOTE — Telephone Encounter (Signed)
Letter sent.

## 2016-02-16 ENCOUNTER — Ambulatory Visit: Payer: BLUE CROSS/BLUE SHIELD | Admitting: Family Medicine

## 2016-04-06 ENCOUNTER — Other Ambulatory Visit: Payer: Self-pay | Admitting: Family Medicine

## 2016-04-29 ENCOUNTER — Other Ambulatory Visit: Payer: Self-pay | Admitting: Family Medicine

## 2016-04-29 NOTE — Telephone Encounter (Signed)
Apt RL 

## 2016-05-04 ENCOUNTER — Encounter: Payer: Self-pay | Admitting: Family Medicine

## 2016-05-04 NOTE — Telephone Encounter (Signed)
Letter sent.

## 2016-08-13 ENCOUNTER — Other Ambulatory Visit: Payer: Self-pay | Admitting: Family Medicine

## 2016-08-16 NOTE — Telephone Encounter (Signed)
Apt pe 

## 2016-08-16 NOTE — Telephone Encounter (Signed)
apt 

## 2016-08-26 ENCOUNTER — Telehealth: Payer: Self-pay | Admitting: Family Medicine

## 2016-08-26 NOTE — Telephone Encounter (Signed)
Called pt, no answer, voicemail not set up. Will try to call again later. Thanks.

## 2016-09-01 ENCOUNTER — Encounter: Payer: Self-pay | Admitting: Family Medicine

## 2016-09-01 NOTE — Telephone Encounter (Signed)
Called pt, no answer, sending out a letter today.

## 2016-10-15 ENCOUNTER — Telehealth: Payer: Self-pay | Admitting: Family Medicine

## 2016-10-18 NOTE — Telephone Encounter (Signed)
Routing to provider. Has not been seen since 11/06/15.

## 2016-10-18 NOTE — Telephone Encounter (Signed)
Apt pe and OV apt

## 2016-10-20 NOTE — Telephone Encounter (Signed)
Pt stated that [facility] and it was sent to [facility] by mistake.

## 2016-11-02 ENCOUNTER — Other Ambulatory Visit: Payer: Self-pay | Admitting: Family Medicine

## 2016-12-10 ENCOUNTER — Other Ambulatory Visit: Payer: Self-pay | Admitting: Family Medicine

## 2017-01-23 ENCOUNTER — Other Ambulatory Visit: Payer: Self-pay | Admitting: Family Medicine

## 2017-02-22 ENCOUNTER — Other Ambulatory Visit: Payer: Self-pay | Admitting: Family Medicine

## 2017-04-14 ENCOUNTER — Encounter: Payer: Self-pay | Admitting: Emergency Medicine

## 2017-04-14 ENCOUNTER — Inpatient Hospital Stay
Admission: EM | Admit: 2017-04-14 | Discharge: 2017-04-22 | DRG: 896 | Disposition: A | Discharge status: Home / Self Care | Payer: Medicare Other | Attending: Internal Medicine | Admitting: Internal Medicine

## 2017-04-14 ENCOUNTER — Emergency Department: Payer: Medicare Other

## 2017-04-14 DIAGNOSIS — I10 Essential (primary) hypertension: Secondary | ICD-10-CM | POA: Diagnosis present

## 2017-04-14 DIAGNOSIS — Z79899 Other long term (current) drug therapy: Secondary | ICD-10-CM | POA: Diagnosis not present

## 2017-04-14 DIAGNOSIS — J440 Chronic obstructive pulmonary disease with acute lower respiratory infection: Secondary | ICD-10-CM | POA: Diagnosis present

## 2017-04-14 DIAGNOSIS — F17210 Nicotine dependence, cigarettes, uncomplicated: Secondary | ICD-10-CM | POA: Diagnosis present

## 2017-04-14 DIAGNOSIS — A0472 Enterocolitis due to Clostridium difficile, not specified as recurrent: Secondary | ICD-10-CM | POA: Diagnosis not present

## 2017-04-14 DIAGNOSIS — F10231 Alcohol dependence with withdrawal delirium: Secondary | ICD-10-CM | POA: Diagnosis present

## 2017-04-14 DIAGNOSIS — M25569 Pain in unspecified knee: Secondary | ICD-10-CM

## 2017-04-14 DIAGNOSIS — F10931 Alcohol use, unspecified with withdrawal delirium: Secondary | ICD-10-CM

## 2017-04-14 DIAGNOSIS — J9601 Acute respiratory failure with hypoxia: Secondary | ICD-10-CM | POA: Diagnosis present

## 2017-04-14 DIAGNOSIS — E871 Hypo-osmolality and hyponatremia: Secondary | ICD-10-CM | POA: Diagnosis present

## 2017-04-14 DIAGNOSIS — R410 Disorientation, unspecified: Secondary | ICD-10-CM | POA: Diagnosis not present

## 2017-04-14 DIAGNOSIS — J449 Chronic obstructive pulmonary disease, unspecified: Secondary | ICD-10-CM

## 2017-04-14 DIAGNOSIS — Z23 Encounter for immunization: Secondary | ICD-10-CM | POA: Diagnosis present

## 2017-04-14 DIAGNOSIS — W1830XA Fall on same level, unspecified, initial encounter: Secondary | ICD-10-CM | POA: Diagnosis present

## 2017-04-14 DIAGNOSIS — R6520 Severe sepsis without septic shock: Secondary | ICD-10-CM | POA: Diagnosis not present

## 2017-04-14 DIAGNOSIS — F05 Delirium due to known physiological condition: Secondary | ICD-10-CM

## 2017-04-14 DIAGNOSIS — J441 Chronic obstructive pulmonary disease with (acute) exacerbation: Secondary | ICD-10-CM | POA: Diagnosis present

## 2017-04-14 DIAGNOSIS — G9341 Metabolic encephalopathy: Secondary | ICD-10-CM | POA: Diagnosis present

## 2017-04-14 DIAGNOSIS — R296 Repeated falls: Secondary | ICD-10-CM | POA: Diagnosis present

## 2017-04-14 DIAGNOSIS — F329 Major depressive disorder, single episode, unspecified: Secondary | ICD-10-CM | POA: Diagnosis present

## 2017-04-14 DIAGNOSIS — F1010 Alcohol abuse, uncomplicated: Secondary | ICD-10-CM | POA: Diagnosis not present

## 2017-04-14 DIAGNOSIS — Z888 Allergy status to other drugs, medicaments and biological substances status: Secondary | ICD-10-CM

## 2017-04-14 DIAGNOSIS — R1310 Dysphagia, unspecified: Secondary | ICD-10-CM | POA: Diagnosis present

## 2017-04-14 DIAGNOSIS — J189 Pneumonia, unspecified organism: Secondary | ICD-10-CM | POA: Diagnosis present

## 2017-04-14 DIAGNOSIS — F10239 Alcohol dependence with withdrawal, unspecified: Secondary | ICD-10-CM

## 2017-04-14 DIAGNOSIS — F419 Anxiety disorder, unspecified: Secondary | ICD-10-CM | POA: Diagnosis present

## 2017-04-14 DIAGNOSIS — S270XXA Traumatic pneumothorax, initial encounter: Secondary | ICD-10-CM | POA: Diagnosis not present

## 2017-04-14 DIAGNOSIS — J9690 Respiratory failure, unspecified, unspecified whether with hypoxia or hypercapnia: Secondary | ICD-10-CM

## 2017-04-14 DIAGNOSIS — M109 Gout, unspecified: Secondary | ICD-10-CM | POA: Diagnosis present

## 2017-04-14 DIAGNOSIS — Z9689 Presence of other specified functional implants: Secondary | ICD-10-CM

## 2017-04-14 DIAGNOSIS — Z1389 Encounter for screening for other disorder: Secondary | ICD-10-CM

## 2017-04-14 DIAGNOSIS — G92 Toxic encephalopathy: Secondary | ICD-10-CM | POA: Diagnosis not present

## 2017-04-14 DIAGNOSIS — A419 Sepsis, unspecified organism: Secondary | ICD-10-CM | POA: Diagnosis not present

## 2017-04-14 DIAGNOSIS — R0600 Dyspnea, unspecified: Secondary | ICD-10-CM

## 2017-04-14 LAB — BASIC METABOLIC PANEL
ANION GAP: 18 — AB (ref 5–15)
ANION GAP: 16 — AB (ref 5–15)
BUN: 11 mg/dL (ref 6–20)
BUN: 11 mg/dL (ref 6–20)
CALCIUM: 8.3 mg/dL — AB (ref 8.9–10.3)
CHLORIDE: 70 mmol/L — AB (ref 101–111)
CO2: 17 mmol/L — AB (ref 22–32)
CO2: 17 mmol/L — AB (ref 22–32)
Calcium: 7.9 mg/dL — ABNORMAL LOW (ref 8.9–10.3)
Chloride: 75 mmol/L — ABNORMAL LOW (ref 101–111)
Creatinine, Ser: 0.86 mg/dL (ref 0.61–1.24)
Creatinine, Ser: 0.93 mg/dL (ref 0.61–1.24)
GFR calc non Af Amer: >60 mL/min (ref >60)
Glucose, Bld: 95 mg/dL (ref 65–99)
Glucose, Bld: 87 mg/dL (ref 65–99)
Potassium: 4.7 mmol/L (ref 3.5–5.1)
Potassium: 4.5 mmol/L (ref 3.5–5.1)
Sodium: 105 mmol/L — CL (ref 135–145)
Sodium: 108 mmol/L — CL (ref 135–145)

## 2017-04-14 LAB — CBC
HCT: 34.7 % — ABNORMAL LOW (ref 40.0–52.0)
HEMOGLOBIN: 12.4 g/dL — AB (ref 13.0–18.0)
MCH: 33.3 pg (ref 26.0–34.0)
MCHC: 35.8 g/dL (ref 32.0–36.0)
MCV: 93 fL (ref 80.0–100.0)
Platelets: 133 10*3/uL — ABNORMAL LOW (ref 150–440)
RBC: 3.73 MIL/uL — AB (ref 4.40–5.90)
RDW: 15.1 % — ABNORMAL HIGH (ref 11.5–14.5)
WBC: 11.7 10*3/uL — ABNORMAL HIGH (ref 3.8–10.6)

## 2017-04-14 LAB — MRSA PCR SCREENING: MRSA BY PCR: NEGATIVE

## 2017-04-14 LAB — ETHANOL: ALCOHOL ETHYL (B): 20 mg/dL — AB (ref <5)

## 2017-04-14 LAB — GLUCOSE, CAPILLARY: Glucose-Capillary: 97 mg/dL (ref 65–99)

## 2017-04-14 MED ORDER — MOMETASONE FURO-FORMOTEROL FUM 200-5 MCG/ACT IN AERO
2.0000 | INHALATION_SPRAY | Freq: Two times a day (BID) | RESPIRATORY_TRACT | Status: DC
  Administered 2017-04-14: 2 via RESPIRATORY_TRACT
  Filled 2017-04-14: qty 8.8

## 2017-04-14 MED ORDER — LORAZEPAM 2 MG/ML IJ SOLN: 0.0000 mg | Freq: Two times a day (BID) | INTRAMUSCULAR | Status: DC

## 2017-04-14 MED ORDER — LORAZEPAM 2 MG/ML IJ SOLN
INTRAMUSCULAR | Status: AC
  Administered 2017-04-14: 2 mg via INTRAVENOUS
  Filled 2017-04-14: qty 1

## 2017-04-14 MED ORDER — ONDANSETRON HCL 4 MG/2ML IJ SOLN
INTRAMUSCULAR | Status: AC
  Filled 2017-04-14: qty 2

## 2017-04-14 MED ORDER — LORAZEPAM 2 MG PO TABS: 0.0000 mg | ORAL_TABLET | Freq: Two times a day (BID) | ORAL | Status: DC

## 2017-04-14 MED ORDER — SODIUM CHLORIDE 0.9 % IV BOLUS (SEPSIS)
1000.0000 mL | Freq: Once | INTRAVENOUS | Status: AC
  Administered 2017-04-14: 1000 mL via INTRAVENOUS

## 2017-04-14 MED ORDER — ALBUTEROL SULFATE (2.5 MG/3ML) 0.083% IN NEBU
2.5000 mg | INHALATION_SOLUTION | RESPIRATORY_TRACT | Status: DC | PRN
  Administered 2017-04-14: 2.5 mg via RESPIRATORY_TRACT
  Filled 2017-04-14: qty 3

## 2017-04-14 MED ORDER — ONDANSETRON HCL 4 MG PO TABS: 4.0000 mg | ORAL_TABLET | Freq: Four times a day (QID) | ORAL | Status: DC | PRN

## 2017-04-14 MED ORDER — FOLIC ACID 1 MG PO TABS
1.0000 mg | ORAL_TABLET | Freq: Every day | ORAL | Status: DC
  Administered 2017-04-14: 1 mg via ORAL
  Filled 2017-04-14: qty 1

## 2017-04-14 MED ORDER — THIAMINE HCL 100 MG/ML IJ SOLN
100.0000 mg | Freq: Every day | INTRAMUSCULAR | Status: DC
  Administered 2017-04-14 – 2017-04-19 (×6): 100 mg via INTRAVENOUS
  Filled 2017-04-14 (×6): qty 2

## 2017-04-14 MED ORDER — SODIUM CHLORIDE 0.9 % IV SOLN
INTRAVENOUS | Status: DC
  Administered 2017-04-14: 20:00:00 via INTRAVENOUS

## 2017-04-14 MED ORDER — ENOXAPARIN SODIUM 40 MG/0.4ML ~~LOC~~ SOLN
40.0000 mg | SUBCUTANEOUS | Status: DC
  Administered 2017-04-14 – 2017-04-21 (×8): 40 mg via SUBCUTANEOUS
  Filled 2017-04-14 (×8): qty 0.4

## 2017-04-14 MED ORDER — SODIUM CHLORIDE 0.9 % IV SOLN
0.1000 ug/kg/h | INTRAVENOUS | Status: AC
  Administered 2017-04-15 (×2): 0.2 ug/kg/h via INTRAVENOUS
  Administered 2017-04-15: 0.7 ug/kg/h via INTRAVENOUS
  Administered 2017-04-16 (×2): 0.2 ug/kg/h via INTRAVENOUS
  Filled 2017-04-14 (×5): qty 2

## 2017-04-14 MED ORDER — LORAZEPAM 2 MG PO TABS: 0.0000 mg | ORAL_TABLET | Freq: Four times a day (QID) | ORAL | Status: DC

## 2017-04-14 MED ORDER — ONDANSETRON HCL 4 MG/2ML IJ SOLN
4.0000 mg | Freq: Four times a day (QID) | INTRAMUSCULAR | Status: DC | PRN
  Administered 2017-04-14: 4 mg via INTRAVENOUS

## 2017-04-14 MED ORDER — VITAMIN B-1 100 MG PO TABS
100.0000 mg | ORAL_TABLET | Freq: Every day | ORAL | Status: DC
  Filled 2017-04-14: qty 1

## 2017-04-14 MED ORDER — LORAZEPAM 2 MG/ML IJ SOLN
0.0000 mg | Freq: Four times a day (QID) | INTRAMUSCULAR | Status: DC
  Administered 2017-04-14: 2 mg via INTRAVENOUS

## 2017-04-14 MED ORDER — LORAZEPAM 2 MG/ML IJ SOLN
1.0000 mg | INTRAMUSCULAR | Status: DC | PRN
Start: 2017-04-14 — End: 2017-04-16
  Administered 2017-04-14 – 2017-04-15 (×10): 2 mg via INTRAVENOUS
  Filled 2017-04-14 (×11): qty 1

## 2017-04-14 MED ORDER — ACETAMINOPHEN 325 MG PO TABS: 650.0000 mg | ORAL_TABLET | Freq: Four times a day (QID) | ORAL | Status: DC | PRN

## 2017-04-14 MED ORDER — ACETAMINOPHEN 650 MG RE SUPP: 650.0000 mg | Freq: Four times a day (QID) | RECTAL | Status: DC | PRN

## 2017-04-14 NOTE — H&P (Signed)
[facility] [place] at [facility]   PATIENT NAME: Arthur Conner    MR#:  [id number]  DATE OF BIRTH:  09-03-1951  DATE OF ADMISSION:  04/14/2017  PRIMARY CARE PHYSICIAN: Steele Sizer, MD   REQUESTING/REFERRING PHYSICIAN: Scotty Court  CHIEF COMPLAINT:  Generalized weakness, nausea, poor by mouth intake, tremors  HISTORY OF PRESENT ILLNESS:  Arthur Conner  is a 66 y.o. male with a known history of Alcohol abuse, drinking liquor on daily basis, is brought into the ED by his son for frequent falls, patient stopped drinking several days ago but he drank wine today am acc to son. According to the son patient was not eating or drinking properly for 2 weeks. Patient is having tremors during my examination, lethargic, unable to provide meaningful history. Son is at bedside providing history. Sodium at 105. CT head negative  PAST MEDICAL HISTORY:   Past Medical History:  Diagnosis Date  . Anxiety   . Asthma   . Depression   . Gout   . Hypertension     PAST SURGICAL HISTOIRY:   Past Surgical History:  Procedure Laterality Date  . APPENDECTOMY    . TONSILLECTOMY      SOCIAL HISTORY:   Social History  Substance Use Topics  . Smoking status: Current Every Day Smoker    Packs/day: 0.50    Types: Cigarettes    Last attempt to quit: 08/11/1990  . Smokeless tobacco: Never Used  . Alcohol use 0.0 oz/week     Comment: "I'm not even sure how much I drink"    FAMILY HISTORY:   Family History  Problem Relation Age of Onset  . Lung cancer Father   . Aneurysm Mother     DRUG ALLERGIES:   Allergies  Allergen Reactions  . Toprol Xl [Metoprolol Tartrate] Other (See Comments)    Bradycardia    REVIEW OF SYSTEMS:   Review of systems unobtainable as the patient is encephalopathic MEDICATIONS AT HOME:   Prior to Admission medications   Medication Sig Start Date End Date Taking? Authorizing Provider  albuterol (PROVENTIL) (2.5 MG/3ML) 0.083%  nebulizer solution INHALE CONTENTS OF 1 VIAL BY MOUTH THROUGH NEBULIZER EVERY 4 TO 6 HOURS AS NEEDED FOR SHORTNESS OF BREATH. 09/08/15  Yes Crissman, Redge Gainer, MD  allopurinol (ZYLOPRIM) 300 MG tablet TAKE 1 TABLET BY MOUTH ONCE DAILY 10/18/16  Yes Crissman, Mark A, MD  amLODipine (NORVASC) 10 MG tablet TAKE 1 TABLET BY MOUTH ONCE DAILY 12/22/15  Yes Crissman, Redge Gainer, MD  budesonide-formoterol (SYMBICORT) 160-4.5 MCG/ACT inhaler Inhale 2 puffs into the lungs 2 (two) times daily.   Yes [provider]  cetirizine (ZYRTEC) 10 MG tablet once daily.   Yes [provider]  hydrochlorothiazide (HYDRODIURIL) 25 MG tablet Take 1 tablet (25 mg total) by mouth daily. 07/29/15  Yes Johnson, Megan P, DO  levothyroxine (SYNTHROID, LEVOTHROID) 100 MCG tablet TAKE 1 TABLET BY MOUTH ONCE DAILY ON AN EMPTY STOMACH. WAIT 30 MINUTES BEFORE TAKING OTHER MEDS. 08/16/16  Yes Crissman, Redge Gainer, MD  PROVENTIL HFA 108 (90 Base) MCG/ACT inhaler INHALE 2 PUFFS BY MOUTH EVERY 6 HOURS AS NEEDED FOR WHEEZING OR SHORTNESS OF BREATH. 01/24/17  Yes Crissman, Redge Gainer, MD  valsartan (DIOVAN) 320 MG tablet Take 320 mg by mouth daily.   Yes [provider]  losartan (COZAAR) 100 MG tablet Take 1 tablet (100 mg total) by mouth daily. Patient not taking: Reported on 04/14/2017 09/10/15   Steele Sizer, MD  triamcinolone cream (KENALOG) 0.1 % Apply topically 2 (two) times daily. Patient not taking: Reported on 04/14/2017 11/28/15   Gabriel CirriWicker, Cheryl, NP  Umeclidinium-Vilanterol [facility](ANORO ELLIPTA) 62.5-25 MCG/INH AEPB Inhale 1 Inhaler into the lungs daily. Patient not taking: Reported on 04/14/2017 09/10/15   Steele Sizerrissman, Mark A, MD      VITAL SIGNS:  Blood pressure 108/60, pulse 82, resp. rate 18, height 6\' 1"  (1.854 m), weight 120.2 kg (265 lb), SpO2 98 %.  PHYSICAL EXAMINATION:  GENERAL:  66 y.o.-year-old patient lying in the bed with no acute distress.  EYES: Pupils equal, round, reactive to light and accommodation. No  scleral icterus.HEENT: Head atraumatic, normocephalic. Oropharynx and nasopharynx clear.  NECK:  Supple, no jugular venous distention. No thyroid enlargement, no tenderness.  LUNGS: Moderate breath sounds bilaterally, no wheezing, rales,rhonchi or crepitation. No use of accessory muscles of respiration.  CARDIOVASCULAR: S1, S2 normal. No murmurs, rubs, or gallops.  ABDOMEN: Soft, nontender, nondistended. Bowel sounds present. No organomegaly or mass.  EXTREMITIES: Tremors are noticed in the extremities No pedal edema, cyanosis, or clubbing.  NEUROLOGIC: Arousable but disoriented  PSYCHIATRIC: The patient is   arousable but disoriented SKIN: No obvious rash, lesion, or ulcer.   LABORATORY PANEL:   CBC  Recent Labs Lab 04/14/17 1312  WBC 11.7*  HGB 12.4*  HCT 34.7*  PLT 133*   ------------------------------------------------------------------------------------------------------------------  Chemistries   Recent Labs Lab 04/14/17 1312  NA 105*  K 4.7  CL 70*  CO2 17*  GLUCOSE 95  BUN 11  CREATININE 0.86  CALCIUM 8.3*   ------------------------------------------------------------------------------------------------------------------  Cardiac Enzymes No results for input(s): TROPONINI in the last 168 hours. ------------------------------------------------------------------------------------------------------------------  RADIOLOGY:  Ct Head Wo Contrast  Result Date: 04/14/2017 CLINICAL DATA:  Several recent falls.  Altered mental status EXAM: CT HEAD WITHOUT CONTRAST TECHNIQUE: Contiguous axial images were obtained from the base of the skull through the vertex without intravenous contrast. COMPARISON:  None. FINDINGS: Brain: The ventricles are normal in size and configuration. Sulci show age related volume loss. There is no intracranial mass, hemorrhage, extra-axial fluid collection, or midline shift. Gray-white compartments appear unremarkable. No evident acute infarct.  Vascular: No hyperdense vessel. There is no appreciable vascular calcification. Skull: The bony calvarium appears intact. Sinuses/Orbits: There is slight mucosal thickening in several ethmoid air cells. There is leftward deviation of the nasal septum. There is a prominent concha bullosa on the right, an anatomic variant. Orbits appear symmetric bilaterally. Other: Visualized mastoid air cells are clear. IMPRESSION: No intracranial mass, hemorrhage, or extra-axial fluid collection. The gray-white compartments appear within normal limits. There is mild ethmoid sinus disease. There is leftward deviation of the nasal septum. Electronically Signed   By: Bretta BangWilliam  Woodruff III M.D.   On: 04/14/2017 15:29   Dg Knee Complete 4 Views Left  Result Date: 04/14/2017 CLINICAL DATA:  Fall with knee pain EXAM: LEFT KNEE - COMPLETE 4+ VIEW COMPARISON:  None. FINDINGS: No evidence of fracture, dislocation, or joint effusion. No evidence of arthropathy or other focal bone abnormality. Soft tissues are unremarkable. IMPRESSION: Negative. Electronically Signed   By: Deatra RobinsonKevin  Herman M.D.   On: 04/14/2017 15:26   Dg Knee Complete 4 Views Right  Result Date: 04/14/2017 CLINICAL DATA:  Multiple falls.  Right knee pain and bruising EXAM: RIGHT KNEE - COMPLETE 4+ VIEW COMPARISON:  None in PACs FINDINGS: The bones are subjectively adequately mineralized. There is mild narrowing of the lateral joint compartment. There is subtle irregularity of the articular surface of the lateral  tibial plateau. I cannot exclude an acute or old tibial plateau fracture. The medial and patellofemoral joint spaces are preserved. Spurs arise from the superior and inferior articular margins of the patella and from the tibial spines. There is no joint effusion. There well-marginated lucency seen on the lateral view involving the proximal tibial metaphysis which may reflect previous orthopedic procedure. IMPRESSION: There degenerative changes of the right knee. I  cannot exclude subacute or old right lateral tibial plateau fracture with mild partial depression. There no previous studies with which to compare. Further evaluation of the right knee with MRI would be useful if the patient can undergo the procedure. Electronically Signed   By: David  [place] M.D.   On: 04/14/2017 15:16   Dg Foot Complete Right  Result Date: 04/14/2017 CLINICAL DATA:  Right foot pain and bruising. History of multiple falls. EXAM: RIGHT FOOT COMPLETE - 3+ VIEW COMPARISON:  None in PACs FINDINGS: The bones are subjectively adequately mineralized. The phalanges and metatarsals appear intact. There is narrowing of the first metatarsophalangeal joint space. The tarsal bones are intact. There is a plantar calcaneal spur. The soft tissues exhibit no acute abnormalities. IMPRESSION: There is no acute fracture nor dislocation of the bones of the right foot. 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 Electronically Signed   By: David  [place] M.D.   On: 04/14/2017 15:17    EKG:   Orders placed or performed during the hospital encounter of 04/14/17  . ED EKG  . ED EKG  . EKG 12-Lead  . EKG 12-Lead    IMPRESSION AND PLAN:  Arthur Conner  is a 66 y.o. male with a known history of Alcohol abuse, drinking liquor on daily basis, is brought into the ED by his son for frequent falls, patient stopped drinking several days ago but he drank wine today am acc to son.    #Acute encephalopathy-metabolic Admit to stepdown unit Replete sodium and other electrolytes Neuro checks Seizure and aspiration precautions Nothing by mouth Follow-up with intensivist  #Severe hyponatremia from dehydration and poor by mouth intake Serial BMPs Hydrated with IV fluids normal saline at 50 cc/h Goal is to replete sodium not more than 10-12 mEq in 24 hours, avoid over jealous correction Follow-up with nephrology Serum and urine osmolality, urine sodium TSH and fasting lipid  panel   #Alcohol withdrawal ciwa Ativan as needed Multivitamin thiamine and folate IV fluids Psychiatry consult is placed Outpatient follow-up with [facility]  #Hypothyroidism Check TSH and continue Synthroid  #Hypertension Currently patient is hypotensive, hydrated with IV fluids and hold antihypertensive medications  #Possible right lateral tibial fracture Pain management and orthopedics consulted for further evaluation    All the records are reviewed and case discussed with ED provider. Management plans discussed with the patient, family and they are in agreement.  CODE STATUS: FC, son is HCPOA  TOTAL  CRITICAL CARE TIME TAKING CARE OF THIS PATIENT: 43  minutes.   Note: This dictation was prepared with Dragon dictation along with smaller phrase technology. Any transcriptional errors that result from this process are unintentional.  Ramonita Lab M.D on 04/14/2017 at 6:45 PM  Between 7am to 6pm - Pager - [phone]  After 6pm go to [email or web address] - password [email or web address] [facility]  [facility]  Office  [phone]  CC: Primary care physician; Steele Sizer, MD

## 2017-04-14 NOTE — Progress Notes (Signed)
Anticoagulation monitoring(Lovenox):  66 yo male ordered Lovenox 30 mg Q24h  Filed Weights   04/14/17 1305  Weight: 265 lb (120.2 kg)   BMI    Lab Results  Component Value Date   CREATININE 0.86 04/14/2017   CREATININE 1.02 09/10/2015   Estimated Creatinine Clearance: 116.3 mL/min (by C-G formula based on SCr of 0.86 mg/dL). Hemoglobin & Hematocrit     Component Value Date/Time   HGB 12.4 (L) 04/14/2017 1312   HCT 34.7 (L) 04/14/2017 1312     Per Protocol for Patient with estCrcl > 30 ml/min and BMI < 40, will transition to Lovenox 40 mg Q24h.

## 2017-04-14 NOTE — ED Notes (Addendum)
Pt's son at bedside. States pt has been drinking daily for "years." States over the last week pt has had multiple falls and is concerned about fractures to bil knees, L lower leg/shin, and R foot. Son also concerned that pt has not been eating anything for 2-3 months. EDP at bedside. CIWA score 15.

## 2017-04-14 NOTE — Consult Note (Signed)
Name: RHODERICK FARREL MRN: [id number] DOB: 1951-05-16    ADMISSION DATE:  04/14/2017 CONSULTATION DATE: 04/14/2017  REFERRING MD : Dr. Amado Coe  CHIEF COMPLAINT: Weakness and Nausea   BRIEF PATIENT DESCRIPTION:  66 yo male admitted 06/7 with severe hyponatremia, ETOH withdrawal, and multiple falls   SIGNIFICANT EVENTS  06/7-Pt admitted to the Stepdown Unit   STUDIES:  CT Head 06/7>>No intracranial mass, hemorrhage, or extra-axial fluid collection. The gray-white compartments appear within normal limits. There is mild ethmoid sinus disease. There is leftward deviation of the nasal septum.  HISTORY OF PRESENT ILLNESS:   This is a 66 yo male with a PMH of HTN, Gout, Depression, Asthma, and Anxiety.  He presented to [facility] ER 06/7 due to weakness, frequent falls, and ETOH withdrawal symptoms.  Per ER notes the pts son reported the pt drinks liquor daily and several days ago he stopped drinking alcohol and began having withdrawal symptoms.  In an attempt to treat the withdrawal symptoms the pt has been drinking wine intermittently.  The pt has not eaten, has had frequent falls over the past 2 days, and has been drinking water only.  He has had bilateral knee pain following falls.  In the ER lab results revealed Na 105, chloride 70, CO2 17, and wbc 11.7.  Therefore, the pt was subsequently admitted to the [facility] Unit for further workup and treatment.  PAST MEDICAL HISTORY :   has a past medical history of Anxiety; Asthma; Depression; Gout; and Hypertension.  has a past surgical history that includes Appendectomy and Tonsillectomy. Prior to Admission medications   Medication Sig Start Date End Date Taking? Authorizing Provider  albuterol (PROVENTIL) (2.5 MG/3ML) 0.083% nebulizer solution INHALE CONTENTS OF 1 VIAL BY MOUTH THROUGH NEBULIZER EVERY 4 TO 6 HOURS AS NEEDED FOR SHORTNESS OF BREATH. 09/08/15  Yes Crissman, Redge Gainer, MD  allopurinol (ZYLOPRIM) 300 MG tablet TAKE 1 TABLET BY MOUTH ONCE  DAILY 10/18/16  Yes Crissman, Mark A, MD  amLODipine (NORVASC) 10 MG tablet TAKE 1 TABLET BY MOUTH ONCE DAILY 12/22/15  Yes Crissman, Redge Gainer, MD  budesonide-formoterol (SYMBICORT) 160-4.5 MCG/ACT inhaler Inhale 2 puffs into the lungs 2 (two) times daily.   Yes [provider]  cetirizine (ZYRTEC) 10 MG tablet once daily.   Yes [provider]  hydrochlorothiazide (HYDRODIURIL) 25 MG tablet Take 1 tablet (25 mg total) by mouth daily. 07/29/15  Yes Johnson, Megan P, DO  levothyroxine (SYNTHROID, LEVOTHROID) 100 MCG tablet TAKE 1 TABLET BY MOUTH ONCE DAILY ON AN EMPTY STOMACH. WAIT 30 MINUTES BEFORE TAKING OTHER MEDS. 08/16/16  Yes Crissman, Redge Gainer, MD  PROVENTIL HFA 108 (90 Base) MCG/ACT inhaler INHALE 2 PUFFS BY MOUTH EVERY 6 HOURS AS NEEDED FOR WHEEZING OR SHORTNESS OF BREATH. 01/24/17  Yes Crissman, Redge Gainer, MD  valsartan (DIOVAN) 320 MG tablet Take 320 mg by mouth daily.   Yes [provider]  losartan (COZAAR) 100 MG tablet Take 1 tablet (100 mg total) by mouth daily. Patient not taking: Reported on 04/14/2017 09/10/15   Steele Sizer, MD  triamcinolone cream (KENALOG) 0.1 % Apply topically 2 (two) times daily. Patient not taking: Reported on 04/14/2017 11/28/15   Gabriel Cirri, NP  Umeclidinium-Vilanterol [facility] ELLIPTA) 62.5-25 MCG/INH AEPB Inhale 1 Inhaler into the lungs daily. Patient not taking: Reported on 04/14/2017 09/10/15   Steele Sizer, MD   Allergies  Allergen Reactions  . Toprol Xl [Metoprolol Tartrate] Other (See Comments)    Bradycardia  FAMILY HISTORY:  family history includes Aneurysm in his mother; Lung cancer in his father. SOCIAL HISTORY:  reports that he has been smoking Cigarettes.  He has been smoking about 0.50 packs per day. He has never used smokeless tobacco. He reports that he drinks alcohol. He reports that he does not use drugs.  REVIEW OF SYSTEMS: Positives in BOLD  Constitutional: Negative for fever, chills, weight loss,  malaise/fatigue and diaphoresis.  HENT: Negative for hearing loss, ear pain, nosebleeds, congestion, sore throat, neck pain, tinnitus and ear discharge.   Eyes: Negative for blurred vision, double vision, photophobia, pain, discharge and redness.  Respiratory: Negative for cough, hemoptysis, sputum production, shortness of breath, wheezing and stridor.   Cardiovascular: Negative for chest pain, palpitations, orthopnea, claudication, leg swelling and PND.  Gastrointestinal: Negative for heartburn, nausea, vomiting, abdominal pain, diarrhea, constipation, blood in stool and melena.  Genitourinary: Negative for dysuria, urgency, frequency, hematuria and flank pain.  Musculoskeletal: Negative for myalgias, back pain, joint pain and falls.  Skin: Negative for itching and rash.  Neurological: Negative for dizziness, tingling, tremors, sensory change, speech change, focal weakness, seizures, loss of consciousness, weakness and headaches.  Endo/Heme/Allergies: Negative for environmental allergies and polydipsia. Does not bruise/bleed easily.  SUBJECTIVE:  Patient states" that he is feeling better now"  VITAL SIGNS: Pulse Rate:  [85-98] 93 (06/07 1700) Resp:  [17-25] 25 (06/07 1700) BP: (109-130)/(45-79) 111/45 (06/07 1700) SpO2:  [94 %-100 %] 99 % (06/07 1700) Weight:  [120.2 kg (265 lb)] 120.2 kg (265 lb) (06/07 1305)  PHYSICAL EXAMINATION: General:  Morbidly obese, in no acute distress Neuro:  Awake,Alert,confused at times HEENT: AT,[place], No jvd Cardiovascular:S1S2,regular, no m/r/g Lungs:  Diminished bibasilar, no wheezes,crackles,rhonchi noted Abdomen:  Soft,NT,ND,+bs Musculoskeletal:  No edema,cyanosis noted Skin:  Multiple bruises,echymosis all over the body   Recent Labs Lab 04/14/17 1312  NA 105*  K 4.7  CL 70*  CO2 17*  BUN 11  CREATININE 0.86  GLUCOSE 95    Recent Labs Lab 04/14/17 1312  HGB 12.4*  HCT 34.7*  WBC 11.7*  PLT 133*   Ct Head Wo Contrast  Result  Date: 04/14/2017 CLINICAL DATA:  Several recent falls.  Altered mental status EXAM: CT HEAD WITHOUT CONTRAST TECHNIQUE: Contiguous axial images were obtained from the base of the skull through the vertex without intravenous contrast. COMPARISON:  None. FINDINGS: Brain: The ventricles are normal in size and configuration. Sulci show age related volume loss. There is no intracranial mass, hemorrhage, extra-axial fluid collection, or midline shift. Gray-white compartments appear unremarkable. No evident acute infarct. Vascular: No hyperdense vessel. There is no appreciable vascular calcification. Skull: The bony calvarium appears intact. Sinuses/Orbits: There is slight mucosal thickening in several ethmoid air cells. There is leftward deviation of the nasal septum. There is a prominent concha bullosa on the right, an anatomic variant. Orbits appear symmetric bilaterally. Other: Visualized mastoid air cells are clear. IMPRESSION: No intracranial mass, hemorrhage, or extra-axial fluid collection. The gray-white compartments appear within normal limits. There is mild ethmoid sinus disease. There is leftward deviation of the nasal septum. Electronically Signed   By: Bretta BangWilliam  Woodruff III M.D.   On: 04/14/2017 15:29   Dg Knee Complete 4 Views Left  Result Date: 04/14/2017 CLINICAL DATA:  Fall with knee pain EXAM: LEFT KNEE - COMPLETE 4+ VIEW COMPARISON:  None. FINDINGS: No evidence of fracture, dislocation, or joint effusion. No evidence of arthropathy or other focal bone abnormality. Soft tissues are unremarkable. IMPRESSION: Negative. Electronically Signed  By: Deatra Robinson M.D.   On: 04/14/2017 15:26   Dg Knee Complete 4 Views Right  Result Date: 04/14/2017 CLINICAL DATA:  Multiple falls.  Right knee pain and bruising EXAM: RIGHT KNEE - COMPLETE 4+ VIEW COMPARISON:  None in PACs FINDINGS: The bones are subjectively adequately mineralized. There is mild narrowing of the lateral joint compartment. There is subtle  irregularity of the articular surface of the lateral tibial plateau. I cannot exclude an acute or old tibial plateau fracture. The medial and patellofemoral joint spaces are preserved. Spurs arise from the superior and inferior articular margins of the patella and from the tibial spines. There is no joint effusion. There well-marginated lucency seen on the lateral view involving the proximal tibial metaphysis which may reflect previous orthopedic procedure. IMPRESSION: There degenerative changes of the right knee. I cannot exclude subacute or old right lateral tibial plateau fracture with mild partial depression. There no previous studies with which to compare. Further evaluation of the right knee with MRI would be useful if the patient can undergo the procedure. Electronically Signed   By: David  [place] M.D.   On: 04/14/2017 15:16   Dg Foot Complete Right  Result Date: 04/14/2017 CLINICAL DATA:  Right foot pain and bruising. History of multiple falls. EXAM: RIGHT FOOT COMPLETE - 3+ VIEW COMPARISON:  None in PACs FINDINGS: The bones are subjectively adequately mineralized. The phalanges and metatarsals appear intact. There is narrowing of the first metatarsophalangeal joint space. The tarsal bones are intact. There is a plantar calcaneal spur. The soft tissues exhibit no acute abnormalities. IMPRESSION: There is no acute fracture nor dislocation of the bones of the right foot. 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 Electronically Signed   By: David  [place] M.D.   On: 04/14/2017 15:17    ASSESSMENT / PLAN:  Severe Hyponatremia   ETOH Withdrawal   Acute metabolic encephalopathy  Subacute vs. Old Right Lateral Tibial Plateau Fracture  Frequent Falls   Mild Leukocytosis   Hypothyroidism   P: Supplemental O2 to maintain O2 sats >92% Prn bronchodilator therapy Continuous telemetry monitoring Trend troponin's  CIWA protocol  Seizure and aspiration  precautions Ativan per CIWA protocol  Start thiamine, folic acid, and mvi Nephrology, Orthopedic, and Psychiatry consulted appreciate input  BMP's q4hrs Goal is to correct sodium 10-71meg in 24 hours F/u serum and urine osmolality,urine sodium Frequent neuro checks  Lovenox for VTE prophylaxis  Trend CBC Monitor for s/sx of bleeding  Transfuse for hgb <7 Fall precautions  Continue Synthroid F/u TSH    Bincy Varughese,AG-ACNP Pulmonary & Critical Care  Billy Fischer, MD PCCM service Mobile [phone] Pager [phone] 04/15/2017 1:13 PM

## 2017-04-14 NOTE — ED Notes (Signed)
Date and time results received: 04/14/17 1344 (use smartphrase ".now" to insert current time)  Test: serum sodium level  Critical Value: 105  Name of Provider Notified: Dr. Scotty CourtStafford  Orders Received? Or Actions Taken?: IVF ordered, seizure precaution pads placed

## 2017-04-14 NOTE — ED Triage Notes (Signed)
Pt presents to ED via [facility] c/o generalized weakness x1 week. C/o n/v with eating. +ETOH abuse per EMS. Pt states last drink was a few days ago, states he doesn't drink everyday. "I'm not sure how often I drink." EKG from EMS showing first degree HB. Current smoker, c/o SOB with exertion. Pt states he has had multiple falls over last week. Scattered bruises in various stages of healing noted to all extremities.

## 2017-04-14 NOTE — ED Provider Notes (Addendum)
[facility] Emergency Department Provider Note  ____________________________________________  Time seen: Approximately 3:05 PM  I have reviewed the triage vital signs and the nursing notes.   HISTORY  Chief Complaint Weakness and Nausea  Level 5 caveat:  Portions of the history and physical were unable to be obtained due to the patient's poor historian   HPI Arthur Conner is a 66 y.o. male brought to ED by her son due to multiple falls. Son reports the patient is a daily liquor drinker but several days ago stopped drinking and started having withdrawal symptoms of this has been getting him wine intermittently to stave off withdrawal symptoms. However the patient doesn't eat and only drinks water and has had increasingly frequent falls over the last 2 days. Patient denies head injury or headaches or blurry vision. Denies paresthesias or weakness anywhere. Patient denies any other complaints. He does endorse some bilateral knee pain.     Past Medical History:  Diagnosis Date  . Anxiety   . Asthma   . Depression   . Gout   . Hypertension      Patient Active Problem List   Diagnosis Date Noted  . Asthmatic bronchitis 10/31/2015  . Hypothyroidism 08/12/2015  . Asthma exacerbation 07/29/2015  . Essential hypertension 04/24/2015     Past Surgical History:  Procedure Laterality Date  . APPENDECTOMY    . TONSILLECTOMY       Prior to Admission medications   Medication Sig Start Date End Date Taking? Authorizing Provider  albuterol (PROVENTIL) (2.5 MG/3ML) 0.083% nebulizer solution INHALE CONTENTS OF 1 VIAL BY MOUTH THROUGH NEBULIZER EVERY 4 TO 6 HOURS AS NEEDED FOR SHORTNESS OF BREATH. 09/08/15  Yes Crissman, Redge Gainer, MD  allopurinol (ZYLOPRIM) 300 MG tablet TAKE 1 TABLET BY MOUTH ONCE DAILY 10/18/16  Yes Crissman, Mark A, MD  amLODipine (NORVASC) 10 MG tablet TAKE 1 TABLET BY MOUTH ONCE DAILY 12/22/15  Yes Crissman, Redge Gainer, MD   budesonide-formoterol (SYMBICORT) 160-4.5 MCG/ACT inhaler Inhale 2 puffs into the lungs 2 (two) times daily.   Yes [provider]  cetirizine (ZYRTEC) 10 MG tablet once daily.   Yes [provider]  hydrochlorothiazide (HYDRODIURIL) 25 MG tablet Take 1 tablet (25 mg total) by mouth daily. 07/29/15  Yes Johnson, Megan P, DO  levothyroxine (SYNTHROID, LEVOTHROID) 100 MCG tablet TAKE 1 TABLET BY MOUTH ONCE DAILY ON AN EMPTY STOMACH. WAIT 30 MINUTES BEFORE TAKING OTHER MEDS. 08/16/16  Yes Crissman, Redge Gainer, MD  PROVENTIL HFA 108 (90 Base) MCG/ACT inhaler INHALE 2 PUFFS BY MOUTH EVERY 6 HOURS AS NEEDED FOR WHEEZING OR SHORTNESS OF BREATH. 01/24/17  Yes Crissman, Redge Gainer, MD  valsartan (DIOVAN) 320 MG tablet Take 320 mg by mouth daily.   Yes [provider]  losartan (COZAAR) 100 MG tablet Take 1 tablet (100 mg total) by mouth daily. Patient not taking: Reported on 04/14/2017 09/10/15   Steele Sizer, MD  triamcinolone cream (KENALOG) 0.1 % Apply topically 2 (two) times daily. Patient not taking: Reported on 04/14/2017 11/28/15   Gabriel Cirri, NP  Umeclidinium-Vilanterol [facility] ELLIPTA) 62.5-25 MCG/INH AEPB Inhale 1 Inhaler into the lungs daily. Patient not taking: Reported on 04/14/2017 09/10/15   Steele Sizer, MD     Allergies Toprol xl [metoprolol tartrate]   Family History  Problem Relation Age of Onset  . Lung cancer Father   . Aneurysm Mother     Social History Social History  Substance Use Topics  . Smoking status:  Current Every Day Smoker    Packs/day: 0.50    Types: Cigarettes    Last attempt to quit: 08/11/1990  . Smokeless tobacco: Never Used  . Alcohol use 0.0 oz/week     Comment: "I'm not even sure how much I drink"    Review of Systems  Constitutional:   No fever or chills.  ENT:   No sore throat. No rhinorrhea. Cardiovascular:   No chest pain or syncope. Respiratory:   No dyspnea or cough. Gastrointestinal:   Negative for abdominal pain,  vomiting and diarrhea.  Musculoskeletal:   Bilateral knee pain All other systems reviewed and are negative except as documented above in ROS and HPI.  ____________________________________________   PHYSICAL EXAM:  VITAL SIGNS: ED Triage Vitals  Enc Vitals Group     BP 04/14/17 1308 (!) 130/58     Pulse Rate 04/14/17 1308 90     Resp 04/14/17 1308 (!) 25     Temp --      Temp src --      SpO2 04/14/17 1308 100 %     Weight 04/14/17 1305 265 lb (120.2 kg)     Height 04/14/17 1305 6\' 1"  (1.854 m)     Head Circumference --      Peak Flow --      Pain Score --      Pain Loc --      Pain Edu? --      Excl. in GC? --     Vital signs reviewed, nursing assessments reviewed.   Constitutional:   Alert and oriented. Not in distress. Eyes:   No scleral icterus.  EOMI. No nystagmus. No conjunctival pallor. PERRL. ENT   Head:   Normocephalic and atraumatic.   Nose:   No congestion/rhinnorhea.    Mouth/Throat:   Dry mucous membranes, no pharyngeal erythema. No peritonsillar mass.    Neck:   No meningismus. Full ROM Hematological/Lymphatic/Immunilogical:   No cervical lymphadenopathy. Cardiovascular:   RRR. Symmetric bilateral radial and DP pulses.  No murmurs.  Respiratory:   Normal respiratory effort without tachypnea/retractions. Breath sounds are clear and equal bilaterally. No wheezes/rales/rhonchi. Gastrointestinal:   Soft and nontender. Non distended. There is no CVA tenderness.  No rebound, rigidity, or guarding. Genitourinary:   deferred Musculoskeletal:   Normal range of motion in all extremities. No joint effusions.  Nonfocal tenderness at bilateral knees at the joint lines. Ecchymosis diffusely over the anterior knee and lower leg bilaterally. Ecchymosis over the right foot. No obvious deformities. Neurologic:   Normal speech and language.  Motor grossly intact. Tremulous. Impaired finger to nose No gross focal neurologic deficits are appreciated.  Skin:     Skin is warm, dry and intact ecchymosis as above. Multiple abrasions of varying ages..  ____________________________________________    LABS (pertinent positives/negatives) (all labs ordered are listed, but only abnormal results are displayed) Labs Reviewed  BASIC METABOLIC PANEL - Abnormal; Notable for the following:       Result Value   Sodium 105 (*)    Chloride 70 (*)    CO2 17 (*)    Calcium 8.3 (*)    Anion gap 18 (*)    All other components within normal limits  CBC - Abnormal; Notable for the following:    WBC 11.7 (*)    RBC 3.73 (*)    Hemoglobin 12.4 (*)    HCT 34.7 (*)    RDW 15.1 (*)    Platelets 133 (*)  All other components within normal limits  ETHANOL - Abnormal; Notable for the following:    Alcohol, Ethyl (B) 20 (*)    All other components within normal limits  URINALYSIS, COMPLETE (UACMP) WITH MICROSCOPIC   ____________________________________________   EKG  Sinus rhythm rate of 83. Normal axis intervals QRS ST segments and T waves.  ____________________________________________    RADIOLOGY  Ct Head Wo Contrast  Result Date: 04/14/2017 CLINICAL DATA:  Several recent falls.  Altered mental status EXAM: CT HEAD WITHOUT CONTRAST TECHNIQUE: Contiguous axial images were obtained from the base of the skull through the vertex without intravenous contrast. COMPARISON:  None. FINDINGS: Brain: The ventricles are normal in size and configuration. Sulci show age related volume loss. There is no intracranial mass, hemorrhage, extra-axial fluid collection, or midline shift. Gray-white compartments appear unremarkable. No evident acute infarct. Vascular: No hyperdense vessel. There is no appreciable vascular calcification. Skull: The bony calvarium appears intact. Sinuses/Orbits: There is slight mucosal thickening in several ethmoid air cells. There is leftward deviation of the nasal septum. There is a prominent concha bullosa on the right, an anatomic variant. Orbits  appear symmetric bilaterally. Other: Visualized mastoid air cells are clear. IMPRESSION: No intracranial mass, hemorrhage, or extra-axial fluid collection. The gray-white compartments appear within normal limits. There is mild ethmoid sinus disease. There is leftward deviation of the nasal septum. Electronically Signed   By: Bretta Bang III M.D.   On: 04/14/2017 15:29   Dg Knee Complete 4 Views Left  Result Date: 04/14/2017 CLINICAL DATA:  Fall with knee pain EXAM: LEFT KNEE - COMPLETE 4+ VIEW COMPARISON:  None. FINDINGS: No evidence of fracture, dislocation, or joint effusion. No evidence of arthropathy or other focal bone abnormality. Soft tissues are unremarkable. IMPRESSION: Negative. Electronically Signed   By: Deatra Robinson M.D.   On: 04/14/2017 15:26   Dg Knee Complete 4 Views Right  Result Date: 04/14/2017 CLINICAL DATA:  Multiple falls.  Right knee pain and bruising EXAM: RIGHT KNEE - COMPLETE 4+ VIEW COMPARISON:  None in PACs FINDINGS: The bones are subjectively adequately mineralized. There is mild narrowing of the lateral joint compartment. There is subtle irregularity of the articular surface of the lateral tibial plateau. I cannot exclude an acute or old tibial plateau fracture. The medial and patellofemoral joint spaces are preserved. Spurs arise from the superior and inferior articular margins of the patella and from the tibial spines. There is no joint effusion. There well-marginated lucency seen on the lateral view involving the proximal tibial metaphysis which may reflect previous orthopedic procedure. IMPRESSION: There degenerative changes of the right knee. I cannot exclude subacute or old right lateral tibial plateau fracture with mild partial depression. There no previous studies with which to compare. Further evaluation of the right knee with MRI would be useful if the patient can undergo the procedure. Electronically Signed   By: David  [place] M.D.   On: 04/14/2017 15:16   Dg  Foot Complete Right  Result Date: 04/14/2017 CLINICAL DATA:  Right foot pain and bruising. History of multiple falls. EXAM: RIGHT FOOT COMPLETE - 3+ VIEW COMPARISON:  None in PACs FINDINGS: The bones are subjectively adequately mineralized. The phalanges and metatarsals appear intact. There is narrowing of the first metatarsophalangeal joint space. The tarsal bones are intact. There is a plantar calcaneal spur. The soft tissues exhibit no acute abnormalities. IMPRESSION: There is no acute fracture nor dislocation of the bones of the right foot. There is mild osteoarthritic joint space loss of  the first MTP joint. Electronically Signed   By: David  [place] M.D.   On: 04/14/2017 15:17   CT head pending X-ray bilateral knees and right foot pending ____________________________________________   PROCEDURES Procedures CRITICAL CARE Performed by: Scotty Court, Perl Folmar   Total critical care time: 35 minutes  Critical care time was exclusive of separately billable procedures and treating other patients.  Critical care was necessary to treat or prevent imminent or life-threatening deterioration.  Critical care was time spent personally by me on the following activities: development of treatment plan with patient and/or surrogate as well as nursing, discussions with consultants, evaluation of patient's response to treatment, examination of patient, obtaining history from patient or surrogate, ordering and performing treatments and interventions, ordering and review of laboratory studies, ordering and review of radiographic studies, pulse oximetry and re-evaluation of patient's condition.  ____________________________________________   INITIAL IMPRESSION / ASSESSMENT AND PLAN / ED COURSE  Pertinent labs & imaging results that were available during my care of the patient were reviewed by me and considered in my medical decision making (see chart for details).  Patient presents with generalized weakness  and loss of balance with multiple falls. Sodium of 105. Mental status appears to be intact, not requiring hypertonic saline at this time. We will resuscitate with 2 L normal saline and admitted to the hospital. CT scan and x-rays were ordered to assess for any occult injury.  CIWA protocol. IV Ativan given for mild to moderate symptoms. Case d/w hospitalist for admission.       ----------------------------------------- 3:32 PM on 04/14/2017 -----------------------------------------  Imaging negative. Suitable for admission [facility].  ____________________________________________   FINAL CLINICAL IMPRESSION(S) / ED DIAGNOSES  Final diagnoses:  Hyponatremia  Alcohol dependence with withdrawal with complication [facility])      New Prescriptions   No medications on file     Portions of this note were generated with dragon dictation software. Dictation errors may occur despite best attempts at proofreading.    Sharman Cheek, MD 04/14/17 1513    Sharman Cheek, MD 04/14/17 [phone]

## 2017-04-15 ENCOUNTER — Inpatient Hospital Stay: Payer: Medicare Other

## 2017-04-15 DIAGNOSIS — F1010 Alcohol abuse, uncomplicated: Secondary | ICD-10-CM

## 2017-04-15 DIAGNOSIS — G92 Toxic encephalopathy: Secondary | ICD-10-CM

## 2017-04-15 DIAGNOSIS — E871 Hypo-osmolality and hyponatremia: Secondary | ICD-10-CM

## 2017-04-15 LAB — URINALYSIS, COMPLETE (UACMP) WITH MICROSCOPIC
Bacteria, UA: NONE SEEN
Bilirubin Urine: NEGATIVE
Glucose, UA: NEGATIVE mg/dL
Ketones, ur: 20 mg/dL — AB
Leukocytes, UA: NEGATIVE
NITRITE: NEGATIVE
PH: 6 (ref 5.0–8.0)
Protein, ur: NEGATIVE mg/dL
RBC / HPF: NONE SEEN RBC/hpf (ref 0–5)
SPECIFIC GRAVITY, URINE: 1.006 (ref 1.005–1.030)
SQUAMOUS EPITHELIAL / LPF: NONE SEEN

## 2017-04-15 LAB — COMPREHENSIVE METABOLIC PANEL
ALBUMIN: 3.5 g/dL (ref 3.5–5.0)
ALT: 86 U/L — ABNORMAL HIGH (ref 17–63)
AST: 109 U/L — AB (ref 15–41)
Alkaline Phosphatase: 36 U/L — ABNORMAL LOW (ref 38–126)
Anion gap: 12 (ref 5–15)
BILIRUBIN TOTAL: 2.1 mg/dL — AB (ref 0.3–1.2)
BUN: 11 mg/dL (ref 6–20)
CO2: 22 mmol/L (ref 22–32)
Calcium: 7.8 mg/dL — ABNORMAL LOW (ref 8.9–10.3)
Chloride: 78 mmol/L — ABNORMAL LOW (ref 101–111)
Creatinine, Ser: 0.86 mg/dL (ref 0.61–1.24)
GFR calc Af Amer: >60 mL/min (ref >60)
GFR calc non Af Amer: >60 mL/min (ref >60)
GLUCOSE: 101 mg/dL — AB (ref 65–99)
POTASSIUM: 3.6 mmol/L (ref 3.5–5.1)
SODIUM: 112 mmol/L — AB (ref 135–145)
TOTAL PROTEIN: 5.9 g/dL — AB (ref 6.5–8.1)

## 2017-04-15 LAB — BASIC METABOLIC PANEL
Anion gap: 11 (ref 5–15)
Anion gap: 15 (ref 5–15)
Anion gap: 16 — ABNORMAL HIGH (ref 5–15)
BUN: 12 mg/dL (ref 6–20)
BUN: 10 mg/dL (ref 6–20)
BUN: 12 mg/dL (ref 6–20)
CALCIUM: 7.8 mg/dL — AB (ref 8.9–10.3)
CALCIUM: 7.9 mg/dL — AB (ref 8.9–10.3)
CALCIUM: 8.2 mg/dL — AB (ref 8.9–10.3)
CHLORIDE: 76 mmol/L — AB (ref 101–111)
CHLORIDE: 77 mmol/L — AB (ref 101–111)
CO2: 21 mmol/L — AB (ref 22–32)
CO2: 19 mmol/L — AB (ref 22–32)
CO2: 20 mmol/L — AB (ref 22–32)
CREATININE: 0.8 mg/dL (ref 0.61–1.24)
CREATININE: 0.88 mg/dL (ref 0.61–1.24)
Chloride: 81 mmol/L — ABNORMAL LOW (ref 101–111)
Creatinine, Ser: 0.78 mg/dL (ref 0.61–1.24)
GFR calc Af Amer: >60 mL/min (ref >60)
GFR calc Af Amer: >60 mL/min (ref >60)
GFR calc Af Amer: >60 mL/min (ref >60)
GFR calc non Af Amer: >60 mL/min (ref >60)
GFR calc non Af Amer: >60 mL/min (ref >60)
GFR calc non Af Amer: >60 mL/min (ref >60)
GLUCOSE: 132 mg/dL — AB (ref 65–99)
GLUCOSE: 104 mg/dL — AB (ref 65–99)
GLUCOSE: 91 mg/dL (ref 65–99)
Potassium: 3.6 mmol/L (ref 3.5–5.1)
Potassium: 4.1 mmol/L (ref 3.5–5.1)
Potassium: 4.1 mmol/L (ref 3.5–5.1)
Sodium: 111 mmol/L — CL (ref 135–145)
Sodium: 112 mmol/L — CL (ref 135–145)
Sodium: 113 mmol/L — CL (ref 135–145)

## 2017-04-15 LAB — CBC
HEMATOCRIT: 31.6 % — AB (ref 40.0–52.0)
Hemoglobin: 11.2 g/dL — ABNORMAL LOW (ref 13.0–18.0)
MCH: 33.4 pg (ref 26.0–34.0)
MCHC: 35.4 g/dL (ref 32.0–36.0)
MCV: 94.3 fL (ref 80.0–100.0)
Platelets: 108 10*3/uL — ABNORMAL LOW (ref 150–440)
RBC: 3.35 MIL/uL — ABNORMAL LOW (ref 4.40–5.90)
RDW: 15.5 % — ABNORMAL HIGH (ref 11.5–14.5)
WBC: 8.7 10*3/uL (ref 3.8–10.6)

## 2017-04-15 LAB — LIPASE, BLOOD: LIPASE: 86 U/L — AB (ref 11–51)

## 2017-04-15 MED ORDER — SODIUM CHLORIDE 0.9 % IV SOLN
INTRAVENOUS | Status: DC
  Administered 2017-04-15: 50 mL/h via INTRAVENOUS
  Administered 2017-04-15 – 2017-04-16 (×2): via INTRAVENOUS
  Administered 2017-04-16: 50 mL/h via INTRAVENOUS

## 2017-04-15 MED ORDER — SODIUM CHLORIDE 3 % IV SOLN
INTRAVENOUS | Status: DC
  Filled 2017-04-15 (×2): qty 500

## 2017-04-15 MED ORDER — FOLIC ACID 5 MG/ML IJ SOLN
1.0000 mg | Freq: Every day | INTRAMUSCULAR | Status: DC
  Administered 2017-04-15 – 2017-04-19 (×5): 1 mg via INTRAVENOUS
  Filled 2017-04-15 (×10): qty 0.2

## 2017-04-15 MED ORDER — ORAL CARE MOUTH RINSE
15.0000 mL | Freq: Two times a day (BID) | OROMUCOSAL | Status: DC
  Administered 2017-04-15 – 2017-04-20 (×9): 15 mL via OROMUCOSAL

## 2017-04-15 MED ORDER — SODIUM CHLORIDE 0.9 % IV BOLUS (SEPSIS)
500.0000 mL | Freq: Once | INTRAVENOUS | Status: AC
  Administered 2017-04-15: 500 mL via INTRAVENOUS

## 2017-04-15 NOTE — Progress Notes (Signed)
Remains lethargic and not following commands No respiratory distress  Vitals:   04/15/17 0300 04/15/17 0400 04/15/17 0500 04/15/17 0600  BP: (!) 85/57 (!) 92/55 (!) 104/58 113/78  Pulse: 84 84 87 84  Resp: 16 14 20 19   Temp:      TempSrc:      SpO2: 97% 96% 97% 98%  Weight:      Height:       HEENT WNL JVP not visualized No wheezes Regular, no murmurs Abdomen soft, mildly tender with bowel sounds present Extremities warm, no edema No focal neurologic deficits Multiple ecchymoses on extremities and abdomen (frequent falls)  BMP Latest Ref Rng & Units 04/15/2017 04/15/2017 04/14/2017  Glucose 65 - 99 mg/dL 454(U104(H) 981(X101(H) 91  BUN 6 - 20 mg/dL 12 11 10   Creatinine 0.61 - 1.24 mg/dL 9.140.80 7.820.86 9.560.88  BUN/Creat Ratio 10 - 22 - - -  Sodium 135 - 145 mmol/L 112(LL) 112(LL) 111(LL)  Potassium 3.5 - 5.1 mmol/L 4.1 3.6 4.1  Chloride 101 - 111 mmol/L 76(L) 78(L) 77(L)  CO2 22 - 32 mmol/L 20(L) 22 19(L)  Calcium 8.9 - 10.3 mg/dL 8.2(L) 7.8(L) 7.9(L)   CBC Latest Ref Rng & Units 04/15/2017 04/14/2017  WBC 3.8 - 10.6 K/uL 8.7 11.7(H)  Hemoglobin 13.0 - 18.0 g/dL 11.2(L) 12.4(L)  Hematocrit 40.0 - 52.0 % 31.6(L) 34.7(L)  Platelets 150 - 440 K/uL 108(L) 133(L)   IMPRESSION: Toxic-metabolic encephalopathy Alcohol abuse Severe hypernatremia Right tibial plateau fracture  PLAN/REC: Discussed with Dr. Cherylann RatelLateef Continue correction of hyponatremia Continue lorazepam as needed for symptoms of alcohol withdrawal Orthopedic surgery consultation has been requested  Billy Fischeravid Simonds, MD PCCM service Mobile [phone] Pager [phone] 04/15/2017 1:22 PM

## 2017-04-15 NOTE — Progress Notes (Signed)
eLink Physician-Brief Progress Note Patient Name: Arthur Conner DOB: 28-Jul-1951 MRN: [id number]   Date of Service  04/15/2017  HPI/Events of Note  Hypotension and bradycardia on precedex.  Current HR of 68 and BP of 73/53 (62).  Patient is lethargic but will follow commands.  Sats are 98% and RR of 15.  Current precedex dose is 0.2 mcg  eICU Interventions  Plan: Reduce precedex to 0.1 as long as patient is not agitated.   500 cc fluid bolus for hypotension [facility]     Intervention Category Intermediate Interventions: Hypotension - evaluation and management;Arrhythmia - evaluation and management  Tequita Marrs 04/15/2017, 9:41 PM

## 2017-04-15 NOTE — Consult Note (Signed)
ORTHOPAEDIC CONSULTATION  REQUESTING PHYSICIAN: Enid BaasKalisetti, Radhika, MD  Chief Complaint: Possible right knee tibial plateau fracture  HPI: Arthur Conner is a 66 y.o. male was brought to the ER by his son for frequent falls and lethargy. Patient was found to be severely hyponatremic. He is admitted to the ICU on the medical service. Patient was noted to have a possible lateral tibial plateau fracture in the right knee on x-ray evaluation of the right lower extremity. There is no acute fracture injury of the left knee or right foot.  His son is at the bedside who provides the history the patient is lethargic and unable to do so.  He states the patient is a multiple falls prior to admission.  Past Medical History:  Diagnosis Date  . Anxiety   . Asthma   . Depression   . Gout   . Hypertension    Past Surgical History:  Procedure Laterality Date  . APPENDECTOMY    . TONSILLECTOMY     Social History   Social History  . Marital status: Married    Spouse name: N/A  . Number of children: 2  . Years of education: N/A   Occupational History  . OWNER Self Employed   Social History Main Topics  . Smoking status: Current Every Day Smoker    Packs/day: 0.50    Types: Cigarettes    Last attempt to quit: 08/11/1990  . Smokeless tobacco: Never Used  . Alcohol use 0.0 oz/week     Comment: "I'm not even sure how much I drink"  . Drug use: No  . Sexual activity: Not Asked   Other Topics Concern  . None   Social History Narrative  . None   Family History  Problem Relation Age of Onset  . Lung cancer Father   . Aneurysm Mother    Allergies  Allergen Reactions  . Toprol Xl [Metoprolol Tartrate] Other (See Comments)    Bradycardia   Prior to Admission medications   Medication Sig Start Date End Date Taking? Authorizing Provider  albuterol (PROVENTIL) (2.5 MG/3ML) 0.083% nebulizer solution INHALE CONTENTS OF 1 VIAL BY MOUTH THROUGH NEBULIZER EVERY 4 TO 6 HOURS AS NEEDED FOR  SHORTNESS OF BREATH. 09/08/15  Yes Crissman, Redge GainerMark A, MD  allopurinol (ZYLOPRIM) 300 MG tablet TAKE 1 TABLET BY MOUTH ONCE DAILY 10/18/16  Yes Crissman, Mark A, MD  amLODipine (NORVASC) 10 MG tablet TAKE 1 TABLET BY MOUTH ONCE DAILY 12/22/15  Yes Crissman, Redge GainerMark A, MD  budesonide-formoterol (SYMBICORT) 160-4.5 MCG/ACT inhaler Inhale 2 puffs into the lungs 2 (two) times daily.   Yes [provider]  cetirizine (ZYRTEC) 10 MG tablet once daily.   Yes [provider]  hydrochlorothiazide (HYDRODIURIL) 25 MG tablet Take 1 tablet (25 mg total) by mouth daily. 07/29/15  Yes Johnson, Megan P, DO  levothyroxine (SYNTHROID, LEVOTHROID) 100 MCG tablet TAKE 1 TABLET BY MOUTH ONCE DAILY ON AN EMPTY STOMACH. WAIT 30 MINUTES BEFORE TAKING OTHER MEDS. 08/16/16  Yes Crissman, Redge GainerMark A, MD  PROVENTIL HFA 108 (90 Base) MCG/ACT inhaler INHALE 2 PUFFS BY MOUTH EVERY 6 HOURS AS NEEDED FOR WHEEZING OR SHORTNESS OF BREATH. 01/24/17  Yes Crissman, Redge GainerMark A, MD  valsartan (DIOVAN) 320 MG tablet Take 320 mg by mouth daily.   Yes [provider]  losartan (COZAAR) 100 MG tablet Take 1 tablet (100 mg total) by mouth daily. Patient not taking: Reported on 04/14/2017 09/10/15   Steele Sizerrissman, Mark A, MD  triamcinolone cream (KENALOG) 0.1 %  Apply topically 2 (two) times daily. Patient not taking: Reported on 04/14/2017 11/28/15   Gabriel Cirri, NP  Umeclidinium-Vilanterol [facility] ELLIPTA) 62.5-25 MCG/INH AEPB Inhale 1 Inhaler into the lungs daily. Patient not taking: Reported on 04/14/2017 09/10/15   Steele Sizer, MD   Ct Head Wo Contrast  Result Date: 04/14/2017 CLINICAL DATA:  Several recent falls.  Altered mental status EXAM: CT HEAD WITHOUT CONTRAST TECHNIQUE: Contiguous axial images were obtained from the base of the skull through the vertex without intravenous contrast. COMPARISON:  None. FINDINGS: Brain: The ventricles are normal in size and configuration. Sulci show age related volume loss. There is no intracranial  mass, hemorrhage, extra-axial fluid collection, or midline shift. Gray-white compartments appear unremarkable. No evident acute infarct. Vascular: No hyperdense vessel. There is no appreciable vascular calcification. Skull: The bony calvarium appears intact. Sinuses/Orbits: There is slight mucosal thickening in several ethmoid air cells. There is leftward deviation of the nasal septum. There is a prominent concha bullosa on the right, an anatomic variant. Orbits appear symmetric bilaterally. Other: Visualized mastoid air cells are clear. IMPRESSION: No intracranial mass, hemorrhage, or extra-axial fluid collection. The gray-white compartments appear within normal limits. There is mild ethmoid sinus disease. There is leftward deviation of the nasal septum. Electronically Signed   By: Bretta Bang III M.D.   On: 04/14/2017 15:29   Dg Knee Complete 4 Views Left  Result Date: 04/14/2017 CLINICAL DATA:  Fall with knee pain EXAM: LEFT KNEE - COMPLETE 4+ VIEW COMPARISON:  None. FINDINGS: No evidence of fracture, dislocation, or joint effusion. No evidence of arthropathy or other focal bone abnormality. Soft tissues are unremarkable. IMPRESSION: Negative. Electronically Signed   By: Deatra Robinson M.D.   On: 04/14/2017 15:26   Dg Knee Complete 4 Views Right  Result Date: 04/14/2017 CLINICAL DATA:  Multiple falls.  Right knee pain and bruising EXAM: RIGHT KNEE - COMPLETE 4+ VIEW COMPARISON:  None in PACs FINDINGS: The bones are subjectively adequately mineralized. There is mild narrowing of the lateral joint compartment. There is subtle irregularity of the articular surface of the lateral tibial plateau. I cannot exclude an acute or old tibial plateau fracture. The medial and patellofemoral joint spaces are preserved. Spurs arise from the superior and inferior articular margins of the patella and from the tibial spines. There is no joint effusion. There well-marginated lucency seen on the lateral view involving the  proximal tibial metaphysis which may reflect previous orthopedic procedure. IMPRESSION: There degenerative changes of the right knee. I cannot exclude subacute or old right lateral tibial plateau fracture with mild partial depression. There no previous studies with which to compare. Further evaluation of the right knee with MRI would be useful if the patient can undergo the procedure. Electronically Signed   By: David  [place] M.D.   On: 04/14/2017 15:16   Dg Foot Complete Right  Result Date: 04/14/2017 CLINICAL DATA:  Right foot pain and bruising. History of multiple falls. EXAM: RIGHT FOOT COMPLETE - 3+ VIEW COMPARISON:  None in PACs FINDINGS: The bones are subjectively adequately mineralized. The phalanges and metatarsals appear intact. There is narrowing of the first metatarsophalangeal joint space. The tarsal bones are intact. There is a plantar calcaneal spur. The soft tissues exhibit no acute abnormalities. IMPRESSION: There is no acute fracture nor dislocation of the bones of the right foot. 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 Electronically Signed   By: David  [place] M.D.   On: 04/14/2017 15:17  Positive ROS: All other systems have been reviewed and were otherwise negative with the exception of those mentioned in the HPI and as above.  Physical Exam: General: Patient is lethargic, unable to provide history or respond to commands  MUSCULOSKELETAL: Patient's bilateral extremities have extensive ecchymosis throughout the leg. Patient has a superficial abrasion over the anterior left knee. Patient has spontaneous movement of his lower extremities including dorsiflexion and plantar flexion of his ankles. He has palpable pedal pulses. Sensation cannot be assessedand the patient lethargy. His motor function can also not be assessed.  Patient does respond to pain with palpation over the ecchymotic areas of both lower extremity is.  Leg compartments are soft and  compressible. There is no obvious deformity. There is no significant swelling. She has no ankle or knee effusion.  Assessment: Bilateral lower extremity ecchymosis with possible lateral tibial plateau fracture of the right knee  Plan: I reviewed the patient's x-rays. Patient has a small area of depression of the lateral tibial plateau of 3 mm or less. This injury is age indeterminate. I recommend proceeding with an MRI of the right knee for further evaluation when the patient is medically stable for this examination.  I will follow up after the MRI has been completed to review the results and reevaluate the patient when he is less lethargic.    Juanell Fairly, MD    04/15/2017 1:42 PM

## 2017-04-15 NOTE — Progress Notes (Signed)
[facility] [place] [facility]   PATIENT NAME: Arthur BakerJoseph Conner    MR#:  [id number]  DATE OF BIRTH:  04-02-1951  SUBJECTIVE:  CHIEF COMPLAINT:   Chief Complaint  Patient presents with  . Weakness  . Nausea   - admitted with altered mental status and noted to have profound  Hyponatremia - very lethargic at this time.  REVIEW OF SYSTEMS:  Review of Systems  Unable to perform ROS: Mental status change    DRUG ALLERGIES:   Allergies  Allergen Reactions  . Toprol Xl [Metoprolol Tartrate] Other (See Comments)    Bradycardia    VITALS:  Blood pressure 113/78, pulse 84, temperature 98.9 F (37.2 C), temperature source Axillary, resp. rate 19, height 6\' 1"  (1.854 m), weight 119.3 kg (263 lb 0.1 oz), SpO2 98 %.  PHYSICAL EXAMINATION:  Physical Exam  GENERAL:  66 y.o.-year-old patient lying in the bed with no acute distress. Hands in  [place] as agitated at times and trying to pull off the cardiac leads EYES: Pupils equal, round, reactive to light and accommodation. No scleral icterus. Extraocular muscles intact.  HEENT: Head atraumatic, normocephalic. Oropharynx and nasopharynx clear.  NECK:  Supple, no jugular venous distention. No thyroid enlargement, no tenderness.  LUNGS: Normal breath sounds bilaterally, no wheezing, rales,rhonchi or crepitation. No use of accessory muscles of respiration. Decreased bibasilar breath sounds CARDIOVASCULAR: S1, S2 normal. No murmurs, rubs, or gallops.  ABDOMEN: Soft, nontender, nondistended. Bowel sounds present. No organomegaly or mass.  EXTREMITIES: No pedal edema, cyanosis, or clubbing.  NEUROLOGIC:  Lethargic, agitated at times, no purposeful movements.  PSYCHIATRIC: The patient is lethargic, easily arousable.  SKIN: No obvious rash, lesion, or ulcer.    LABORATORY PANEL:   CBC  Recent Labs Lab 04/15/17 0326  WBC 8.7  HGB 11.2*  HCT 31.6*  PLT 108*    ------------------------------------------------------------------------------------------------------------------  Chemistries   Recent Labs Lab 04/15/17 0326 04/15/17 0835  NA 112* 112*  K 3.6 4.1  CL 78* 76*  CO2 22 20*  GLUCOSE 101* 104*  BUN 11 12  CREATININE 0.86 0.80  CALCIUM 7.8* 8.2*  AST 109*  --   ALT 86*  --   ALKPHOS 36*  --   BILITOT 2.1*  --    ------------------------------------------------------------------------------------------------------------------  Cardiac Enzymes No results for input(s): TROPONINI in the last 168 hours. ------------------------------------------------------------------------------------------------------------------  RADIOLOGY:  Ct Head Wo Contrast  Result Date: 04/14/2017 CLINICAL DATA:  Several recent falls.  Altered mental status EXAM: CT HEAD WITHOUT CONTRAST TECHNIQUE: Contiguous axial images were obtained from the base of the skull through the vertex without intravenous contrast. COMPARISON:  None. FINDINGS: Brain: The ventricles are normal in size and configuration. Sulci show age related volume loss. There is no intracranial mass, hemorrhage, extra-axial fluid collection, or midline shift. Gray-white compartments appear unremarkable. No evident acute infarct. Vascular: No hyperdense vessel. There is no appreciable vascular calcification. Skull: The bony calvarium appears intact. Sinuses/Orbits: There is slight mucosal thickening in several ethmoid air cells. There is leftward deviation of the nasal septum. There is a prominent concha bullosa on the right, an anatomic variant. Orbits appear symmetric bilaterally. Other: Visualized mastoid air cells are clear. IMPRESSION: No intracranial mass, hemorrhage, or extra-axial fluid collection. The gray-white compartments appear within normal limits. There is mild ethmoid sinus disease. There is leftward deviation of the nasal septum. Electronically Signed   By: Bretta BangWilliam  Woodruff III M.D.    On: 04/14/2017 15:29   Dg Knee Complete 4  Views Left  Result Date: 04/14/2017 CLINICAL DATA:  Fall with knee pain EXAM: LEFT KNEE - COMPLETE 4+ VIEW COMPARISON:  None. FINDINGS: No evidence of fracture, dislocation, or joint effusion. No evidence of arthropathy or other focal bone abnormality. Soft tissues are unremarkable. IMPRESSION: Negative. Electronically Signed   By: Deatra Robinson M.D.   On: 04/14/2017 15:26   Dg Knee Complete 4 Views Right  Result Date: 04/14/2017 CLINICAL DATA:  Multiple falls.  Right knee pain and bruising EXAM: RIGHT KNEE - COMPLETE 4+ VIEW COMPARISON:  None in PACs FINDINGS: The bones are subjectively adequately mineralized. There is mild narrowing of the lateral joint compartment. There is subtle irregularity of the articular surface of the lateral tibial plateau. I cannot exclude an acute or old tibial plateau fracture. The medial and patellofemoral joint spaces are preserved. Spurs arise from the superior and inferior articular margins of the patella and from the tibial spines. There is no joint effusion. There well-marginated lucency seen on the lateral view involving the proximal tibial metaphysis which may reflect previous orthopedic procedure. IMPRESSION: There degenerative changes of the right knee. I cannot exclude subacute or old right lateral tibial plateau fracture with mild partial depression. There no previous studies with which to compare. Further evaluation of the right knee with MRI would be useful if the patient can undergo the procedure. Electronically Signed   By: David  [place] M.D.   On: 04/14/2017 15:16   Dg Foot Complete Right  Result Date: 04/14/2017 CLINICAL DATA:  Right foot pain and bruising. History of multiple falls. EXAM: RIGHT FOOT COMPLETE - 3+ VIEW COMPARISON:  None in PACs FINDINGS: The bones are subjectively adequately mineralized. The phalanges and metatarsals appear intact. There is narrowing of the first metatarsophalangeal joint space. The  tarsal bones are intact. There is a plantar calcaneal spur. The soft tissues exhibit no acute abnormalities. IMPRESSION: There is no acute fracture nor dislocation of the bones of the right foot. 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 Electronically Signed   By: David  [place] M.D.   On: 04/14/2017 15:17    EKG:   Orders placed or performed during the hospital encounter of 04/14/17  . ED EKG  . ED EKG  . EKG 12-Lead  . EKG 12-Lead    ASSESSMENT AND PLAN:   65 year old male with past medical history significant for depression and anxiety, hypertension, alcohol abuse presents to the hospital secondary to altered mental status and falls and noted to have hyponatremia.  #1 hyponatremia-likely acute, the report of mania and also poor oral intake. -Aim for 8-10 mEq per 24 hour correction. Sodium is at 112 today. -Continue every 6 hours sodium checks. Appreciate nephrology consult. -Currently on normal saline at 50 cc per hour.  #2 altered mental status-metabolic encephalopathy secondary to hyponatremia. -Continue to monitor. No focal deficits. -CT of the head with no acute findings.  #3 alcohol abuse with possible alcoholic liver disease-will need counseling. -Some agitation likely from withdrawals. Will be placed on Precedex drip while in the ICU. -Psych consult requested  #4 fall-secondary to intoxication. X-rays revealed either subacute or old right tibial plateau fractures. Orthopedics consult requested. -Physical therapy once patient is more alert.  #5 DVT prophylaxis-Lovenox   All the records are reviewed and case discussed with Care Management/Social Workerr. Management plans discussed with the patient, family and they are in agreement.  CODE STATUS: Full Code  TOTAL TIME TAKING CARE OF THIS PATIENT: 38 minutes.  POSSIBLE D/C IN 2-3 DAYS, DEPENDING ON CLINICAL CONDITION.   Legna Mausolf M.D on 04/15/2017 at 12:45 PM  Between 7am to 6pm  - Pager - [phone]  After 6pm go to [email or web address] - Social research officer, government  [facility]  Office  [phone]  CC: Primary care physician; Steele Sizer, MD

## 2017-04-15 NOTE — Consult Note (Signed)
Psychiatry: Consult received. Chart reviewed. Came by to see the patient. Patient is sound asleep and is on Precedex drip and multiple sedation medications because of agitation. Nursing tells me that he was agitated and confused previously. Patient is not arousable and can't give any information right now. According to the chart he was brought in by family because of worsening weakness and falls. A history was reported of daily drinking but there wasn't a clear quantification. No information as to whether he has ever had alcohol withdrawal seizures or delirium tremens in the past. Nothing in the old chart to suggest this has been addressed as a problem previously.  66 year old man currently requiring sedation for agitation and delirium. Likely cause of the delirium would be alcohol withdrawal confusion although other causes of delirium should be ruled out. No need for any further psychiatric intervention or treatment at this point. Patient can be safely detoxed in the ICU. If further psychiatric intervention is required for any reason or when the patient wakes up please contact the doctor on call over the weekend or contact me on Monday.

## 2017-04-15 NOTE — Consult Note (Signed)
[facility] CONSULT NOTE    Date: 04/15/2017                  Patient Name:  Arthur Conner  MRN: [id number]  DOB: [birth year]  Age / Sex: 66 y.o., male         PCP: Steele Sizerrissman, Mark A, MD                 Service Requesting Consult: Pulmonary/critical care                 Reason for Consult: hyponatremia            History of Present Illness: Patient is a 66 y.o. male with a PMHx of Hypertension, hypoglycemia, asthma, gout, seasonal allergies, anxiety, gout and hypothyroidism, who was admitted to [facility] on 04/14/2017 for evaluation of generalized weakness, nausea, poor by mouth intake, and tremors. The patient has known history of alcohol abuse and was drinking liquor apparently on a daily basis. Unable to currently obtaining history from the patient as he is on a Precedex drip. Apparently the patient was also having frequent falls at home which was noted by his son. The patient stopped drinking several days prior to admission. Apparently he was not eating properly for at least 2 weeks prior to admission as well. Upon arrival here he was found to have significant metabolic derangement. His serum sodium was 105. We were contacted by the hospitalist at that time and recommended that he be started on 0.9 normal saline at 50 cc per hour to slowly correct his serum sodium. Serum sodium the same is up to 112.   Medications: Outpatient medications: Prescriptions Prior to Admission  Medication Sig Dispense Refill Last Dose  . albuterol (PROVENTIL) (2.5 MG/3ML) 0.083% nebulizer solution INHALE CONTENTS OF 1 VIAL BY MOUTH THROUGH NEBULIZER EVERY 4 TO 6 HOURS AS NEEDED FOR SHORTNESS OF BREATH. 300 mL 12 prn at prn  . allopurinol (ZYLOPRIM) 300 MG tablet TAKE 1 TABLET BY MOUTH ONCE DAILY 30 tablet 2   . amLODipine (NORVASC) 10 MG tablet TAKE 1 TABLET BY MOUTH ONCE DAILY 30 tablet 2   . budesonide-formoterol (SYMBICORT) 160-4.5 MCG/ACT inhaler Inhale 2 puffs into the lungs 2 (two)  times daily.     . cetirizine (ZYRTEC) 10 MG tablet once daily.   Taking  . hydrochlorothiazide (HYDRODIURIL) 25 MG tablet Take 1 tablet (25 mg total) by mouth daily. 30 tablet 6 Taking  . levothyroxine (SYNTHROID, LEVOTHROID) 100 MCG tablet TAKE 1 TABLET BY MOUTH ONCE DAILY ON AN EMPTY STOMACH. WAIT 30 MINUTES BEFORE TAKING OTHER MEDS. 30 tablet 1   . PROVENTIL HFA 108 (90 Base) MCG/ACT inhaler INHALE 2 PUFFS BY MOUTH EVERY 6 HOURS AS NEEDED FOR WHEEZING OR SHORTNESS OF BREATH. 6.7 g 1 prn at prn  . valsartan (DIOVAN) 320 MG tablet Take 320 mg by mouth daily.     Marland Kitchen. losartan (COZAAR) 100 MG tablet Take 1 tablet (100 mg total) by mouth daily. (Patient not taking: Reported on 04/14/2017) 30 tablet 6 Not Taking at Unknown time  . triamcinolone cream (KENALOG) 0.1 % Apply topically 2 (two) times daily. (Patient not taking: Reported on 04/14/2017) 30 g 1 Not Taking at Unknown time  . Umeclidinium-Vilanterol (ANORO ELLIPTA) 62.5-25 MCG/INH AEPB Inhale 1 Inhaler into the lungs daily. (Patient not taking: Reported on 04/14/2017) 1 each 12 Not Taking at Unknown time    Current medications: Current Facility-Administered Medications  Medication Dose Route Frequency Provider Last  Rate Last Dose  . acetaminophen (TYLENOL) tablet 650 mg  650 mg Oral Q6H PRN Gouru, Aruna, MD       Or  . acetaminophen (TYLENOL) suppository 650 mg  650 mg Rectal Q6H PRN Gouru, Aruna, MD      . albuterol (PROVENTIL) (2.5 MG/3ML) 0.083% nebulizer solution 2.5 mg  2.5 mg Nebulization Q4H PRN Gouru, Aruna, MD   2.5 mg at 04/14/17 1655  . dexmedetomidine (PRECEDEX) 200 mcg in sodium chloride 0.9 % 50 mL (4 mcg/mL) infusion  0.4-1.2 mcg/kg/hr Intravenous Titrated Varughese, Bincy S, NP      . enoxaparin (LOVENOX) injection 40 mg  40 mg Subcutaneous Q24H Gouru, Aruna, MD   40 mg at 04/14/17 2117  . folic acid injection 1 mg  1 mg Intravenous Daily Merwyn Katos, MD      . LORazepam (ATIVAN) injection 1-2 mg  1-2 mg Intravenous Q1H PRN  Varughese, Bincy S, NP   2 mg at 04/15/17 1006  . ondansetron (ZOFRAN) tablet 4 mg  4 mg Oral Q6H PRN Gouru, Aruna, MD       Or  . ondansetron (ZOFRAN) injection 4 mg  4 mg Intravenous Q6H PRN Gouru, Aruna, MD   4 mg at 04/14/17 1930  . thiamine (B-1) injection 100 mg  100 mg Intravenous Daily Sharman Cheek, MD   100 mg at 04/15/17 1007      Allergies: Allergies  Allergen Reactions  . Toprol Xl [Metoprolol Tartrate] Other (See Comments)    Bradycardia      Past Medical History: Past Medical History:  Diagnosis Date  . Anxiety   . Asthma   . Depression   . Gout   . Hypertension      Past Surgical History: Past Surgical History:  Procedure Laterality Date  . APPENDECTOMY    . TONSILLECTOMY       Family History: Family History  Problem Relation Age of Onset  . Lung cancer Father   . Aneurysm Mother      Social History: Social History   Social History  . Marital status: Married    Spouse name: N/A  . Number of children: 2  . Years of education: N/A   Occupational History  . OWNER Self Employed   Social History Main Topics  . Smoking status: Current Every Day Smoker    Packs/day: 0.50    Types: Cigarettes    Last attempt to quit: 08/11/1990  . Smokeless tobacco: Never Used  . Alcohol use 0.0 oz/week     Comment: "I'm not even sure how much I drink"  . Drug use: No  . Sexual activity: Not on file   Other Topics Concern  . Not on file   Social History Narrative  . No narrative on file     Review of Systems: Patient unable to provide as he is currently on a Precedex drip.  Vital Signs: Blood pressure 113/78, pulse 84, temperature 98.9 F (37.2 C), temperature source Axillary, resp. rate 19, height 6\' 1"  (1.854 m), weight 119.3 kg (263 lb 0.1 oz), SpO2 98 %.  Weight trends: Filed Weights   04/14/17 1305 04/14/17 1915  Weight: 120.2 kg (265 lb) 119.3 kg (263 lb 0.1 oz)    Physical Exam: General: NAD, resting in bed  Head:  Normocephalic, atraumatic.  Eyes: Mild icterus, spontaneous EOMs noted  Nose: Mucous membranes moist, not inflammed, nonerythematous.  Throat: Oropharynx nonerythematous, no exudate appreciated.   Neck: Supple, trachea midline.  Lungs:  Normal  respiratory effort. Clear to auscultation BL without crackles or wheezes.  Heart: RRR. S1 and S2 normal without gallop, murmur, or rubs.  Abdomen:  BS normoactive. Soft, Nondistended, non-tender.  No masses or organomegaly.  Extremities: No pretibial edema.  Neurologic: Sedated on precedex drip, not following commands  Skin: No visible rashes, scars.    Lab results: Basic Metabolic Panel:  Recent Labs Lab 04/14/17 2330 04/15/17 0326 04/15/17 0835  NA 111* 112* 112*  K 4.1 3.6 4.1  CL 77* 78* 76*  CO2 19* 22 20*  GLUCOSE 91 101* 104*  BUN 10 11 12   CREATININE 0.88 0.86 0.80  CALCIUM 7.9* 7.8* 8.2*    Liver Function Tests:  Recent Labs Lab 04/15/17 0326  AST 109*  ALT 86*  ALKPHOS 36*  BILITOT 2.1*  PROT 5.9*  ALBUMIN 3.5   No results for input(s): LIPASE, AMYLASE in the last 168 hours. No results for input(s): AMMONIA in the last 168 hours.  CBC:  Recent Labs Lab 04/14/17 1312 04/15/17 0326  WBC 11.7* 8.7  HGB 12.4* 11.2*  HCT 34.7* 31.6*  MCV 93.0 94.3  PLT 133* 108*    Cardiac Enzymes: No results for input(s): CKTOTAL, CKMB, CKMBINDEX, TROPONINI in the last 168 hours.  BNP: Invalid input(s): POCBNP  CBG:  Recent Labs Lab 04/14/17 1927  GLUCAP 97    Microbiology: Results for orders placed or performed during the hospital encounter of 04/14/17  MRSA PCR Screening     Status: None   Collection Time: 04/14/17  7:30 PM  Result Value Ref Range Status   MRSA by PCR NEGATIVE NEGATIVE Final    Comment:        The GeneXpert MRSA Assay (FDA approved for NASAL specimens only), is one component of a comprehensive MRSA colonization surveillance program. It is not intended to diagnose MRSA infection nor to  guide or monitor treatment for MRSA infections.     Coagulation Studies: No results for input(s): LABPROT, INR in the last 72 hours.  Urinalysis:  Recent Labs  04/15/17 0844  COLORURINE YELLOW*  LABSPEC 1.006  PHURINE 6.0  GLUCOSEU NEGATIVE  HGBUR SMALL*  BILIRUBINUR NEGATIVE  KETONESUR 20*  PROTEINUR NEGATIVE  NITRITE NEGATIVE  LEUKOCYTESUR NEGATIVE      Imaging: Ct Head Wo Contrast  Result Date: 04/14/2017 CLINICAL DATA:  Several recent falls.  Altered mental status EXAM: CT HEAD WITHOUT CONTRAST TECHNIQUE: Contiguous axial images were obtained from the base of the skull through the vertex without intravenous contrast. COMPARISON:  None. FINDINGS: Brain: The ventricles are normal in size and configuration. Sulci show age related volume loss. There is no intracranial mass, hemorrhage, extra-axial fluid collection, or midline shift. Gray-white compartments appear unremarkable. No evident acute infarct. Vascular: No hyperdense vessel. There is no appreciable vascular calcification. Skull: The bony calvarium appears intact. Sinuses/Orbits: There is slight mucosal thickening in several ethmoid air cells. There is leftward deviation of the nasal septum. There is a prominent concha bullosa on the right, an anatomic variant. Orbits appear symmetric bilaterally. Other: Visualized mastoid air cells are clear. IMPRESSION: No intracranial mass, hemorrhage, or extra-axial fluid collection. The gray-white compartments appear within normal limits. There is mild ethmoid sinus disease. There is leftward deviation of the nasal septum. Electronically Signed   By: Bretta Bang III M.D.   On: 04/14/2017 15:29   Dg Knee Complete 4 Views Left  Result Date: 04/14/2017 CLINICAL DATA:  Fall with knee pain EXAM: LEFT KNEE - COMPLETE 4+ VIEW COMPARISON:  None.  FINDINGS: No evidence of fracture, dislocation, or joint effusion. No evidence of arthropathy or other focal bone abnormality. Soft tissues are  unremarkable. IMPRESSION: Negative. Electronically Signed   By: Deatra Robinson M.D.   On: 04/14/2017 15:26   Dg Knee Complete 4 Views Right  Result Date: 04/14/2017 CLINICAL DATA:  Multiple falls.  Right knee pain and bruising EXAM: RIGHT KNEE - COMPLETE 4+ VIEW COMPARISON:  None in PACs FINDINGS: The bones are subjectively adequately mineralized. There is mild narrowing of the lateral joint compartment. There is subtle irregularity of the articular surface of the lateral tibial plateau. I cannot exclude an acute or old tibial plateau fracture. The medial and patellofemoral joint spaces are preserved. Spurs arise from the superior and inferior articular margins of the patella and from the tibial spines. There is no joint effusion. There well-marginated lucency seen on the lateral view involving the proximal tibial metaphysis which may reflect previous orthopedic procedure. IMPRESSION: There degenerative changes of the right knee. I cannot exclude subacute or old right lateral tibial plateau fracture with mild partial depression. There no previous studies with which to compare. Further evaluation of the right knee with MRI would be useful if the patient can undergo the procedure. Electronically Signed   By: David  [place] M.D.   On: 04/14/2017 15:16   Dg Foot Complete Right  Result Date: 04/14/2017 CLINICAL DATA:  Right foot pain and bruising. History of multiple falls. EXAM: RIGHT FOOT COMPLETE - 3+ VIEW COMPARISON:  None in PACs FINDINGS: The bones are subjectively adequately mineralized. The phalanges and metatarsals appear intact. There is narrowing of the first metatarsophalangeal joint space. The tarsal bones are intact. There is a plantar calcaneal spur. The soft tissues exhibit no acute abnormalities. IMPRESSION: There is no acute fracture nor dislocation of the bones of the right foot. 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 Electronically Signed   By: David  [place] M.D.    On: 04/14/2017 15:17      Assessment & Plan: Pt is a 66 y.o. male with a PMHx of Hypertension, hypoglycemia, asthma, gout, seasonal allergies, anxiety, gout and hypothyroidism, who was admitted to [facility] on 04/14/2017 for evaluation of generalized weakness, nausea, poor by mouth intake, and tremors.  1.  Severe hyponatremia, admitting Na 105. 2.  ETOH abuse. 3.  Altered mental status, due to hyponatremia and ETOH withdrawal. 4.  Hypertension.  Plan:  We will consulted for the evaluation and management of hyponatremia. He was started on 0.9 normal saline at 50 cc/h yesterday. He presented at 112 yesterday in his serum sodium was 105. 3% saline was being considered however his rate of correction is currently appropriate width 0.9 normal saline. We will restart 0.9 normal saline at 50 cc/h. Continue to monitor serum sodium closely. Target correction is 8-10 mEq per day. We will continue to monitor along on the case. Thanks for consultation.

## 2017-04-15 NOTE — Progress Notes (Signed)
Received call from lab w/ critical sodium of 112. Dr. Sung AmabileSimonds at the bedside and notified of same. Awaiting new orders.

## 2017-04-15 NOTE — Progress Notes (Signed)
Initial Nutrition Assessment  DOCUMENTATION CODES:   Morbid obesity  INTERVENTION:  1. Monitor for diet advancement 2. Recommend MVI w/ minerals in addition to IV thiamine and folic acid  NUTRITION DIAGNOSIS:   Predicted suboptimal nutrient intake related to other (see comment) (Alcohol abuse) as evidenced by other (see comment) (per chart review)  GOAL:   Patient will meet greater than or equal to 90% of their needs  MONITOR:   I & O's, Labs, Weight trends, Diet advancement  REASON FOR ASSESSMENT:   Malnutrition Screening Tool    ASSESSMENT:   Arthur Conner  is a 66 y.o. male with a known history of Alcohol abuse, drinking liquor on daily basis, is brought into the ED by his son for frequent falls, patient stopped drinking several days ago but he drank wine today am acc to son.   Patient sound asleep on multiple sedation medications. Unable to provide any history. Nutrition-Focused physical exam completed. Findings are no fat depletion, no muscle depletion, and no edema.  No evidence of weight loss in chart Labs and medications reviewed: NS @ 2050mL/hr Precedex gtt Na 112  Diet Order:     Skin:  Reviewed, no issues  Last BM:  PTA  Height:   Ht Readings from Last 1 Encounters:  04/14/17 6\' 1"  (1.854 m)    Weight:   Wt Readings from Last 1 Encounters:  04/14/17 263 lb 0.1 oz (119.3 kg)    Ideal Body Weight:  83.63 kg  BMI:  Body mass index is 34.7 kg/m.  Estimated Nutritional Needs:   Kcal:  1900-2200 calories (20-25 cal/kg ABW)  Protein:  120-145 grams  Fluid:  >/= 1.9L  EDUCATION NEEDS:   Education needs no appropriate at this time  Arthur AnoWilliam M. Broden Holt, MS, RD LDN Inpatient Clinical Dietitian Pager [phone]

## 2017-04-16 ENCOUNTER — Inpatient Hospital Stay: Payer: Medicare Other

## 2017-04-16 DIAGNOSIS — R410 Disorientation, unspecified: Secondary | ICD-10-CM

## 2017-04-16 LAB — CBC
HCT: 31 % — ABNORMAL LOW (ref 40.0–52.0)
Hemoglobin: 11.2 g/dL — ABNORMAL LOW (ref 13.0–18.0)
MCH: 34.1 pg — ABNORMAL HIGH (ref 26.0–34.0)
MCHC: 36.3 g/dL — ABNORMAL HIGH (ref 32.0–36.0)
MCV: 94.1 fL (ref 80.0–100.0)
PLATELETS: 111 10*3/uL — AB (ref 150–440)
RBC: 3.29 MIL/uL — AB (ref 4.40–5.90)
RDW: 15.1 % — ABNORMAL HIGH (ref 11.5–14.5)
WBC: 6.4 10*3/uL (ref 3.8–10.6)

## 2017-04-16 LAB — BASIC METABOLIC PANEL
Anion gap: 9 (ref 5–15)
BUN: 13 mg/dL (ref 6–20)
CALCIUM: 8 mg/dL — AB (ref 8.9–10.3)
CHLORIDE: 86 mmol/L — AB (ref 101–111)
CO2: 23 mmol/L (ref 22–32)
CREATININE: 0.78 mg/dL (ref 0.61–1.24)
GFR calc non Af Amer: >60 mL/min (ref >60)
Glucose, Bld: 105 mg/dL — ABNORMAL HIGH (ref 65–99)
Potassium: 3.5 mmol/L (ref 3.5–5.1)
SODIUM: 118 mmol/L — AB (ref 135–145)

## 2017-04-16 MED ORDER — LORAZEPAM 2 MG/ML IJ SOLN
1.0000 mg | INTRAMUSCULAR | Status: DC | PRN
  Administered 2017-04-16 – 2017-04-17 (×2): 1 mg via INTRAVENOUS
  Filled 2017-04-16: qty 1

## 2017-04-16 MED ORDER — IPRATROPIUM-ALBUTEROL 0.5-2.5 (3) MG/3ML IN SOLN
3.0000 mL | Freq: Four times a day (QID) | RESPIRATORY_TRACT | Status: DC
  Administered 2017-04-16 – 2017-04-22 (×24): 3 mL via RESPIRATORY_TRACT
  Filled 2017-04-16 (×26): qty 3

## 2017-04-16 MED ORDER — BUDESONIDE 0.25 MG/2ML IN SUSP
0.2500 mg | Freq: Four times a day (QID) | RESPIRATORY_TRACT | Status: DC
  Administered 2017-04-16 – 2017-04-22 (×24): 0.25 mg via RESPIRATORY_TRACT
  Filled 2017-04-16 (×25): qty 2

## 2017-04-16 MED ORDER — ALBUTEROL SULFATE (2.5 MG/3ML) 0.083% IN NEBU
2.5000 mg | INHALATION_SOLUTION | RESPIRATORY_TRACT | Status: DC | PRN
  Administered 2017-04-19: 2.5 mg via RESPIRATORY_TRACT
  Filled 2017-04-16: qty 3

## 2017-04-16 MED ORDER — LORAZEPAM 2 MG/ML IJ SOLN
INTRAMUSCULAR | Status: AC
  Filled 2017-04-16: qty 1

## 2017-04-16 NOTE — Progress Notes (Signed)
Subjective:  Patient has received his MRI of the right knee. He is still in the ICU and remains lethargic and unable to follow commands to provide history.  Objective:   VITALS:   Vitals:   04/16/17 0152 04/16/17 0400 04/16/17 0500 04/16/17 0600  BP:  (!) 82/51 (!) 69/57 (!) 79/56  Pulse:  77 77 74  Resp:  (!) 23 (!) 36 (!) 24  Temp: 98.6 F (37 C)     TempSrc: Axillary     SpO2:  96% 97% 97%  Weight:      Height:        PHYSICAL EXAM:  Bilateral lower extremities: Patient has extensive ecchymosis in the lower extremities consistent with his exam yesterday. The compartments remained soft. There is no excessive swelling in his thighs legs or feet. He has spontaneous movement of his toes and ankles. Pedal pulses. Sensation cannot be assessed based on the patient's lethargy and inability to follow commands.   LABS  Results for orders placed or performed during the hospital encounter of 04/14/17 (from the past 24 hour(s))  Basic metabolic panel     Status: Abnormal   Collection Time: 04/15/17  5:56 PM  Result Value Ref Range   Sodium 113 (LL) 135 - 145 mmol/L   Potassium 3.6 3.5 - 5.1 mmol/L   Chloride 81 (L) 101 - 111 mmol/L   CO2 21 (L) 22 - 32 mmol/L   Glucose, Bld 132 (H) 65 - 99 mg/dL   BUN 12 6 - 20 mg/dL   Creatinine, Ser 1.61 0.61 - 1.24 mg/dL   Calcium 7.8 (L) 8.9 - 10.3 mg/dL   GFR calc non Af Amer >60 >60 mL/min   GFR calc Af Amer >60 >60 mL/min   Anion gap 11 5 - 15  Basic metabolic panel     Status: Abnormal   Collection Time: 04/16/17  4:25 AM  Result Value Ref Range   Sodium 118 (LL) 135 - 145 mmol/L   Potassium 3.5 3.5 - 5.1 mmol/L   Chloride 86 (L) 101 - 111 mmol/L   CO2 23 22 - 32 mmol/L   Glucose, Bld 105 (H) 65 - 99 mg/dL   BUN 13 6 - 20 mg/dL   Creatinine, Ser 0.96 0.61 - 1.24 mg/dL   Calcium 8.0 (L) 8.9 - 10.3 mg/dL   GFR calc non Af Amer >60 >60 mL/min   GFR calc Af Amer >60 >60 mL/min   Anion gap 9 5 - 15  CBC     Status: Abnormal    Collection Time: 04/16/17  4:25 AM  Result Value Ref Range   WBC 6.4 3.8 - 10.6 K/uL   RBC 3.29 (L) 4.40 - 5.90 MIL/uL   Hemoglobin 11.2 (L) 13.0 - 18.0 g/dL   HCT 04.5 (L) 40.9 - 81.1 %   MCV 94.1 80.0 - 100.0 fL   MCH 34.1 (H) 26.0 - 34.0 pg   MCHC 36.3 (H) 32.0 - 36.0 g/dL   RDW 91.4 (H) 78.2 - 95.6 %   Platelets 111 (L) 150 - 440 K/uL    Dg Eye Foreign Body  Result Date: 04/15/2017 CLINICAL DATA:  Metal exposure.  Clearance prior to MRI EXAM: ORBITS FOR FOREIGN BODY - 2 VIEW COMPARISON:  None. FINDINGS: There is no evidence of metallic foreign body within the orbits. No significant bone abnormality identified. IMPRESSION: No evidence of metallic foreign body within the orbits. Electronically Signed   By: Tollie Eth M.D.   On: 04/15/2017  18:30   Ct Head Wo Contrast  Result Date: 04/14/2017 CLINICAL DATA:  Several recent falls.  Altered mental status EXAM: CT HEAD WITHOUT CONTRAST TECHNIQUE: Contiguous axial images were obtained from the base of the skull through the vertex without intravenous contrast. COMPARISON:  None. FINDINGS: Brain: The ventricles are normal in size and configuration. Sulci show age related volume loss. There is no intracranial mass, hemorrhage, extra-axial fluid collection, or midline shift. Gray-white compartments appear unremarkable. No evident acute infarct. Vascular: No hyperdense vessel. There is no appreciable vascular calcification. Skull: The bony calvarium appears intact. Sinuses/Orbits: There is slight mucosal thickening in several ethmoid air cells. There is leftward deviation of the nasal septum. There is a prominent concha bullosa on the right, an anatomic variant. Orbits appear symmetric bilaterally. Other: Visualized mastoid air cells are clear. IMPRESSION: No intracranial mass, hemorrhage, or extra-axial fluid collection. The gray-white compartments appear within normal limits. There is mild ethmoid sinus disease. There is leftward deviation of the nasal  septum. Electronically Signed   By: Bretta BangWilliam  Woodruff III M.D.   On: 04/14/2017 15:29   Mr Knee Right Wo Contrast  Result Date: 04/16/2017 CLINICAL DATA:  Recurrent falls over the past week with bilateral knee injuries and pain. EXAM: MRI OF THE RIGHT KNEE WITHOUT CONTRAST TECHNIQUE: Multiplanar, multisequence MR imaging of the knee was performed. No intravenous contrast was administered. COMPARISON:  Plain films of the right knee 04/14/2016. FINDINGS: MENISCI Medial meniscus: The posterior horn is severely degenerated with extensive complex tearing throughout. Horizontal and longitudinal tearing are seen in the body. Lateral meniscus: Severe complex tearing is present throughout a severely degenerated body. The anterior horn is not visualized consistent with maceration. Horizontal tear in the posterior horn is seen. LIGAMENTS Cruciates:  Intact. Collaterals:  Intact. CARTILAGE Patellofemoral: Fissuring and irregularity are seen along the lateral facet. No focal defect. Medial:  Moderately degenerated. Lateral:  Extensively thinned and irregular. Joint:  Small effusion. Popliteal Fossa:  No Baker's cyst. Extensor Mechanism:  Intact. Bones: No acute bony or joint abnormality. The patient has a mildly depressed remote tibial plateau fracture. Osteophytosis is present about the knee. Other: None. IMPRESSION: Negative for acute bony or joint abnormality. Mildly depressed and remote lateral tibial plateau fracture. Advanced medial and lateral compartment osteoarthritis with associated extensive degeneration and tearing of both the medial and lateral menisci. Electronically Signed   By: Drusilla Kannerhomas  Dalessio M.D.   On: 04/16/2017 11:31   Dg Knee Complete 4 Views Left  Result Date: 04/14/2017 CLINICAL DATA:  Fall with knee pain EXAM: LEFT KNEE - COMPLETE 4+ VIEW COMPARISON:  None. FINDINGS: No evidence of fracture, dislocation, or joint effusion. No evidence of arthropathy or other focal bone abnormality. Soft tissues are  unremarkable. IMPRESSION: Negative. Electronically Signed   By: Deatra RobinsonKevin  Herman M.D.   On: 04/14/2017 15:26   Dg Knee Complete 4 Views Right  Result Date: 04/14/2017 CLINICAL DATA:  Multiple falls.  Right knee pain and bruising EXAM: RIGHT KNEE - COMPLETE 4+ VIEW COMPARISON:  None in PACs FINDINGS: The bones are subjectively adequately mineralized. There is mild narrowing of the lateral joint compartment. There is subtle irregularity of the articular surface of the lateral tibial plateau. I cannot exclude an acute or old tibial plateau fracture. The medial and patellofemoral joint spaces are preserved. Spurs arise from the superior and inferior articular margins of the patella and from the tibial spines. There is no joint effusion. There well-marginated lucency seen on the lateral view involving the proximal  tibial metaphysis which may reflect previous orthopedic procedure. IMPRESSION: There degenerative changes of the right knee. I cannot exclude subacute or old right lateral tibial plateau fracture with mild partial depression. There no previous studies with which to compare. Further evaluation of the right knee with MRI would be useful if the patient can undergo the procedure. Electronically Signed   By: David  [place] M.D.   On: 04/14/2017 15:16   Dg Foot Complete Right  Result Date: 04/14/2017 CLINICAL DATA:  Right foot pain and bruising. History of multiple falls. EXAM: RIGHT FOOT COMPLETE - 3+ VIEW COMPARISON:  None in PACs FINDINGS: The bones are subjectively adequately mineralized. The phalanges and metatarsals appear intact. There is narrowing of the first metatarsophalangeal joint space. The tarsal bones are intact. There is a plantar calcaneal spur. The soft tissues exhibit no acute abnormalities. IMPRESSION: There is no acute fracture nor dislocation of the bones of the right foot. 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 Electronically Signed   By: David  [place] M.D.    On: 04/14/2017 15:17    Assessment/Plan:     Active Problems:   Hyponatremia  I reviewed the MRI of the right knee. There is no evidence of an acute fracture in the right knee. He does have arthritis and torn meniscus, but these are likely chronic and do not require acute surgical intervention.  I have ordered a PT evaluation for when the patient is more alert and able to participate.    Juanell Fairly , MD 04/16/2017, 1:29 PM

## 2017-04-16 NOTE — Progress Notes (Signed)
[employer] -  at [facility]   PATIENT NAME: Arthur BakerJoseph Conner    MR#:  [id number]  DATE OF BIRTH:  Apr 01, 1951  SUBJECTIVE:  CHIEF COMPLAINT:   Chief Complaint  Patient presents with  . Weakness  . Nausea   - Sodium continues to be low, but better than yesterday. Now at 118. -On Precedex infusion. But more alert and following commands today. Still very confused  REVIEW OF SYSTEMS:  Review of Systems  Unable to perform ROS: Mental status change    DRUG ALLERGIES:   Allergies  Allergen Reactions  . Toprol Xl [Metoprolol Tartrate] Other (See Comments)    Bradycardia    VITALS:  Blood pressure (!) 79/56, pulse 74, temperature 98.6 F (37 C), temperature source Axillary, resp. rate (!) 24, height 6\' 1"  (1.854 m), weight 119.3 kg (263 lb 0.1 oz), SpO2 97 %.  PHYSICAL EXAMINATION:  Physical Exam  GENERAL:  66 y.o.-year-old patient lying in the bed with no acute distress.  EYES: Pupils equal, round, reactive to light and accommodation. No scleral icterus. Extraocular muscles intact.  HEENT: Head atraumatic, normocephalic. Oropharynx and nasopharynx clear.  NECK:  Supple, no jugular venous distention. No thyroid enlargement, no tenderness.  LUNGS: Normal breath sounds bilaterally, no wheezing, rales,rhonchi or crepitation. No use of accessory muscles of respiration. Decreased bibasilar breath sounds CARDIOVASCULAR: S1, S2 normal. No murmurs, rubs, or gallops.  ABDOMEN: Soft, nontender, nondistended. Bowel sounds present. No organomegaly or mass.  EXTREMITIES: No pedal edema, cyanosis, or clubbing.  NEUROLOGIC:  Cranial nerves seem to be intact, able to move all 4 extremities in bed. Sensation is intact. Following simple commands today. Not completely oriented at this time yet.Arthur Conner.  PSYCHIATRIC: The patient is Is easily arousable, following simple commands but not oriented.  SKIN: No obvious rash, lesion, or ulcer.    LABORATORY PANEL:   CBC  Recent  Labs Lab 04/16/17 0425  WBC 6.4  HGB 11.2*  HCT 31.0*  PLT 111*   ------------------------------------------------------------------------------------------------------------------  Chemistries   Recent Labs Lab 04/15/17 0326  04/16/17 0425  NA 112*  < > 118*  K 3.6  < > 3.5  CL 78*  < > 86*  CO2 22  < > 23  GLUCOSE 101*  < > 105*  BUN 11  < > 13  CREATININE 0.86  < > 0.78  CALCIUM 7.8*  < > 8.0*  AST 109*  --   --   ALT 86*  --   --   ALKPHOS 36*  --   --   BILITOT 2.1*  --   --   < > = values in this interval not displayed. ------------------------------------------------------------------------------------------------------------------  Cardiac Enzymes No results for input(s): TROPONINI in the last 168 hours. ------------------------------------------------------------------------------------------------------------------  RADIOLOGY:  Dg Eye Foreign Body  Result Date: 04/15/2017 CLINICAL DATA:  Metal exposure.  Clearance prior to MRI EXAM: ORBITS FOR FOREIGN BODY - 2 VIEW COMPARISON:  None. FINDINGS: There is no evidence of metallic foreign body within the orbits. No significant bone abnormality identified. IMPRESSION: No evidence of metallic foreign body within the orbits. Electronically Signed   By: Tollie Ethavid  Kwon M.D.   On: 04/15/2017 18:30   Ct Head Wo Contrast  Result Date: 04/14/2017 CLINICAL DATA:  Several recent falls.  Altered mental status EXAM: CT HEAD WITHOUT CONTRAST TECHNIQUE: Contiguous axial images were obtained from the base of the skull through the vertex without intravenous contrast. COMPARISON:  None. FINDINGS: Brain: The ventricles are normal in size  and configuration. Sulci show age related volume loss. There is no intracranial mass, hemorrhage, extra-axial fluid collection, or midline shift. Gray-white compartments appear unremarkable. No evident acute infarct. Vascular: No hyperdense vessel. There is no appreciable vascular calcification. Skull: The  bony calvarium appears intact. Sinuses/Orbits: There is slight mucosal thickening in several ethmoid air cells. There is leftward deviation of the nasal septum. There is a prominent concha bullosa on the right, an anatomic variant. Orbits appear symmetric bilaterally. Other: Visualized mastoid air cells are clear. IMPRESSION: No intracranial mass, hemorrhage, or extra-axial fluid collection. The gray-white compartments appear within normal limits. There is mild ethmoid sinus disease. There is leftward deviation of the nasal septum. Electronically Signed   By: Bretta Bang III M.D.   On: 04/14/2017 15:29   Mr Knee Right Wo Contrast  Result Date: 04/16/2017 CLINICAL DATA:  Recurrent falls over the past week with bilateral knee injuries and pain. EXAM: MRI OF THE RIGHT KNEE WITHOUT CONTRAST TECHNIQUE: Multiplanar, multisequence MR imaging of the knee was performed. No intravenous contrast was administered. COMPARISON:  Plain films of the right knee 04/14/2016. FINDINGS: MENISCI Medial meniscus: The posterior horn is severely degenerated with extensive complex tearing throughout. Horizontal and longitudinal tearing are seen in the body. Lateral meniscus: Severe complex tearing is present throughout a severely degenerated body. The anterior horn is not visualized consistent with maceration. Horizontal tear in the posterior horn is seen. LIGAMENTS Cruciates:  Intact. Collaterals:  Intact. CARTILAGE Patellofemoral: Fissuring and irregularity are seen along the lateral facet. No focal defect. Medial:  Moderately degenerated. Lateral:  Extensively thinned and irregular. Joint:  Small effusion. Popliteal Fossa:  No Conner's cyst. Extensor Mechanism:  Intact. Bones: No acute bony or joint abnormality. The patient has a mildly depressed remote tibial plateau fracture. Osteophytosis is present about the knee. Other: None. IMPRESSION: Negative for acute bony or joint abnormality. Mildly depressed and remote lateral tibial  plateau fracture. Advanced medial and lateral compartment osteoarthritis with associated extensive degeneration and tearing of both the medial and lateral menisci. Electronically Signed   By: Drusilla Kanner M.D.   On: 04/16/2017 11:31   Dg Knee Complete 4 Views Left  Result Date: 04/14/2017 CLINICAL DATA:  Fall with knee pain EXAM: LEFT KNEE - COMPLETE 4+ VIEW COMPARISON:  None. FINDINGS: No evidence of fracture, dislocation, or joint effusion. No evidence of arthropathy or other focal bone abnormality. Soft tissues are unremarkable. IMPRESSION: Negative. Electronically Signed   By: Deatra Robinson M.D.   On: 04/14/2017 15:26   Dg Knee Complete 4 Views Right  Result Date: 04/14/2017 CLINICAL DATA:  Multiple falls.  Right knee pain and bruising EXAM: RIGHT KNEE - COMPLETE 4+ VIEW COMPARISON:  None in PACs FINDINGS: The bones are subjectively adequately mineralized. There is mild narrowing of the lateral joint compartment. There is subtle irregularity of the articular surface of the lateral tibial plateau. I cannot exclude an acute or old tibial plateau fracture. The medial and patellofemoral joint spaces are preserved. Spurs arise from the superior and inferior articular margins of the patella and from the tibial spines. There is no joint effusion. There well-marginated lucency seen on the lateral view involving the proximal tibial metaphysis which may reflect previous orthopedic procedure. IMPRESSION: There degenerative changes of the right knee. I cannot exclude subacute or old right lateral tibial plateau fracture with mild partial depression. There no previous studies with which to compare. Further evaluation of the right knee with MRI would be useful if the patient can undergo  the procedure. Electronically Signed   By: David  [place]az[place]and M.D.   On: 04/14/2017 15:16   Dg Foot Complete Right  Result Date: 04/14/2017 CLINICAL DATA:  Right foot pain and bruising. History of multiple falls. EXAM: RIGHT FOOT  COMPLETE - 3+ VIEW COMPARISON:  None in PACs FINDINGS: The bones are subjectively adequately mineralized. The phalanges and metatarsals appear intact. There is narrowing of the first metatarsophalangeal joint space. The tarsal bones are intact. There is a plantar calcaneal spur. The soft tissues exhibit no acute abnormalities. IMPRESSION: There is no acute fracture nor dislocation of the bones of the right foot. 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 Electronically Signed   By: David  [place] M.D.   On: 04/14/2017 15:17    EKG:   Orders placed or performed during the hospital encounter of 04/14/17  . ED EKG  . ED EKG  . EKG 12-Lead  . EKG 12-Lead    ASSESSMENT AND PLAN:   66 year old male with past medical history significant for depression and anxiety, hypertension, alcohol abuse presents to the hospital secondary to altered mental status and falls and noted to have hyponatremia.  #1 hyponatremia-likely acute, beer potomania and also poor oral intake. -slowly improving sodium 118- continue normal saline. -Continue every 6 hours sodium checks. Appreciate nephrology consult. -Currently on normal saline at 50 cc per hour.  #2 altered mental status-metabolic encephalopathy secondary to hyponatremia and DTs. -Continue to monitor. No focal deficits. Improving slowly -CT of the head with no acute findings.  #3 alcohol abuse with possible alcoholic liver disease-will need counseling. -Some agitation likely from withdrawals. on Precedex drip while in the ICU. -Psych consult requested  #4 fall-secondary to intoxication. X-rays revealed either subacute or old right tibial plateau fractures. Orthopedics consult requested. MRI showing it to be remote fracture -Physical therapy once patient is more alert.  #5 nausea/vomiting-prior to admission with some abdominal pain. Once patient is more alert, we will evaluate and see if he will need a CT of the abdomen.  #6 DVT  prophylaxis-Lovenox   All the records are reviewed and case discussed with Care Management/Social Workerr. Management plans discussed with the patient, family and they are in agreement.  CODE STATUS: Full Code  TOTAL TIME TAKING CARE OF THIS PATIENT: 36 minutes.   POSSIBLE D/C IN 2-3 DAYS, DEPENDING ON CLINICAL CONDITION.   Nemiah Kissner M.D on 04/16/2017 at 12:59 PM  Between 7am to 6pm - Pager - [phone]  After 6pm go to [email or web address] - Social research officer, government  [facility]  Office  [phone]  CC: Primary care physician; Steele Sizer, MD

## 2017-04-16 NOTE — Progress Notes (Signed)
Less lethargic. Now following commands No respiratory distress  Vitals:   04/16/17 0152 04/16/17 0400 04/16/17 0500 04/16/17 0600  BP:  (!) 82/51 (!) 69/57 (!) 79/56  Pulse:  77 77 74  Resp:  (!) 23 (!) 36 (!) 24  Temp: 98.6 F (37 C)     TempSrc: Axillary     SpO2:  96% 97% 97%  Weight:      Height:       HEENT WNL JVP not visualized No wheezes Regular, no murmurs Abdomen soft, mildly tender, bowel sounds present Extremities warm, no edema No focal neurologic deficits Multiple ecchymoses on extremities and abdomen   BMP Latest Ref Rng & Units 04/16/2017 04/15/2017 04/15/2017  Glucose 65 - 99 mg/dL 161(W105(H) 960(A132(H) 540(J104(H)  BUN 6 - 20 mg/dL 13 12 12   Creatinine 0.61 - 1.24 mg/dL 8.110.78 9.140.78 7.820.80  BUN/Creat Ratio 10 - 22 - - -  Sodium 135 - 145 mmol/L 118(LL) 113(LL) 112(LL)  Potassium 3.5 - 5.1 mmol/L 3.5 3.6 4.1  Chloride 101 - 111 mmol/L 86(L) 81(L) 76(L)  CO2 22 - 32 mmol/L 23 21(L) 20(L)  Calcium 8.9 - 10.3 mg/dL 8.0(L) 7.8(L) 8.2(L)   CBC Latest Ref Rng & Units 04/16/2017 04/15/2017 04/14/2017  WBC 3.8 - 10.6 K/uL 6.4 8.7 11.7(H)  Hemoglobin 13.0 - 18.0 g/dL 11.2(L) 11.2(L) 12.4(L)  Hematocrit 40.0 - 52.0 % 31.0(L) 31.6(L) 34.7(L)  Platelets 150 - 440 K/uL 111(L) 108(L) 133(L)   IMPRESSION: Toxic-metabolic encephalopathy - improving Alcohol abuse  Mild agitation Severe hyponatremia Right tibial plateau fracture  PLAN/REC: Continue normal saline Try to discontinue Precedex today Continue low dose lorazepam as needed for agitation and alcohol withdrawal MRI of right knee has been ordered by orthopedic surgery Will watch in stepdown unit at least 1 more day  Billy Fischeravid Simonds, MD PCCM service Mobile [phone] Pager [phone] 04/16/2017 11:48 AM

## 2017-04-16 NOTE — Progress Notes (Signed)
[facility]  ROUNDING NOTE   Subjective:  Serum sodium has improved to 118. Patient remains on Precedex drip but is arousable. Renal function normal at the moment.   Objective:  Vital signs in last 24 hours:  Temp:  [98.4 F (36.9 C)-98.7 F (37.1 C)] 98.6 F (37 C) (06/09 0152) Pulse Rate:  [65-86] 74 (06/09 0600) Resp:  [13-36] 24 (06/09 0600) BP: (50-101)/(38-86) 79/56 (06/09 0600) SpO2:  [88 %-99 %] 97 % (06/09 0600)  Weight change:  Filed Weights   04/14/17 1305 04/14/17 1915  Weight: 120.2 kg (265 lb) 119.3 kg (263 lb 0.1 oz)    Intake/Output: I/O last 3 completed shifts: In: 1471.8 [I.V.:971.8; IV Piggyback:500] Out: -    Intake/Output this shift:  No intake/output data recorded.  Physical Exam: General: No acute distress  Head: Normocephalic, atraumatic. Moist oral mucosal membranes  Eyes: Anicteric  Neck: Supple, trachea midline  Lungs:  Clear to auscultation, normal effort  Heart: S1S2 no rubs  Abdomen:  Soft, nontender, bowel sounds present  Extremities: No peripheral edema.  Neurologic: Sedated but is arousable   Skin: No lesions       Basic Metabolic Panel:  Recent Labs Lab 04/14/17 2330 04/15/17 0326 04/15/17 0835 04/15/17 1756 04/16/17 0425  NA 111* 112* 112* 113* 118*  K 4.1 3.6 4.1 3.6 3.5  CL 77* 78* 76* 81* 86*  CO2 19* 22 20* 21* 23  GLUCOSE 91 101* 104* 132* 105*  BUN 10 11 12 12 13   CREATININE 0.88 0.86 0.80 0.78 0.78  CALCIUM 7.9* 7.8* 8.2* 7.8* 8.0*    Liver Function Tests:  Recent Labs Lab 04/15/17 0326  AST 109*  ALT 86*  ALKPHOS 36*  BILITOT 2.1*  PROT 5.9*  ALBUMIN 3.5    Recent Labs Lab 04/15/17 0835  LIPASE 86*   No results for input(s): AMMONIA in the last 168 hours.  CBC:  Recent Labs Lab 04/14/17 1312 04/15/17 0326 04/16/17 0425  WBC 11.7* 8.7 6.4  HGB 12.4* 11.2* 11.2*  HCT 34.7* 31.6* 31.0*  MCV 93.0 94.3 94.1  PLT 133* 108* 111*    Cardiac Enzymes: No results for  input(s): CKTOTAL, CKMB, CKMBINDEX, TROPONINI in the last 168 hours.  BNP: Invalid input(s): POCBNP  CBG:  Recent Labs Lab 04/14/17 1927  GLUCAP 97    Microbiology: Results for orders placed or performed during the hospital encounter of 04/14/17  MRSA PCR Screening     Status: None   Collection Time: 04/14/17  7:30 PM  Result Value Ref Range Status   MRSA by PCR NEGATIVE NEGATIVE Final    Comment:        The GeneXpert MRSA Assay (FDA approved for NASAL specimens only), is one component of a comprehensive MRSA colonization surveillance program. It is not intended to diagnose MRSA infection nor to guide or monitor treatment for MRSA infections.     Coagulation Studies: No results for input(s): LABPROT, INR in the last 72 hours.  Urinalysis:  Recent Labs  04/15/17 0844  COLORURINE YELLOW*  LABSPEC 1.006  PHURINE 6.0  GLUCOSEU NEGATIVE  HGBUR SMALL*  BILIRUBINUR NEGATIVE  KETONESUR 20*  PROTEINUR NEGATIVE  NITRITE NEGATIVE  LEUKOCYTESUR NEGATIVE      Imaging: Dg Eye Foreign Body  Result Date: 04/15/2017 CLINICAL DATA:  Metal exposure.  Clearance prior to MRI EXAM: ORBITS FOR FOREIGN BODY - 2 VIEW COMPARISON:  None. FINDINGS: There is no evidence of metallic foreign body within the orbits. No significant bone abnormality  identified. IMPRESSION: No evidence of metallic foreign body within the orbits. Electronically Signed   By: Tollie Ethavid  Kwon M.D.   On: 04/15/2017 18:30   Ct Head Wo Contrast  Result Date: 04/14/2017 CLINICAL DATA:  Several recent falls.  Altered mental status EXAM: CT HEAD WITHOUT CONTRAST TECHNIQUE: Contiguous axial images were obtained from the base of the skull through the vertex without intravenous contrast. COMPARISON:  None. FINDINGS: Brain: The ventricles are normal in size and configuration. Sulci show age related volume loss. There is no intracranial mass, hemorrhage, extra-axial fluid collection, or midline shift. Gray-white compartments  appear unremarkable. No evident acute infarct. Vascular: No hyperdense vessel. There is no appreciable vascular calcification. Skull: The bony calvarium appears intact. Sinuses/Orbits: There is slight mucosal thickening in several ethmoid air cells. There is leftward deviation of the nasal septum. There is a prominent concha bullosa on the right, an anatomic variant. Orbits appear symmetric bilaterally. Other: Visualized mastoid air cells are clear. IMPRESSION: No intracranial mass, hemorrhage, or extra-axial fluid collection. The gray-white compartments appear within normal limits. There is mild ethmoid sinus disease. There is leftward deviation of the nasal septum. Electronically Signed   By: Bretta BangWilliam  Woodruff III M.D.   On: 04/14/2017 15:29   Dg Knee Complete 4 Views Left  Result Date: 04/14/2017 CLINICAL DATA:  Fall with knee pain EXAM: LEFT KNEE - COMPLETE 4+ VIEW COMPARISON:  None. FINDINGS: No evidence of fracture, dislocation, or joint effusion. No evidence of arthropathy or other focal bone abnormality. Soft tissues are unremarkable. IMPRESSION: Negative. Electronically Signed   By: Deatra RobinsonKevin  Herman M.D.   On: 04/14/2017 15:26   Dg Knee Complete 4 Views Right  Result Date: 04/14/2017 CLINICAL DATA:  Multiple falls.  Right knee pain and bruising EXAM: RIGHT KNEE - COMPLETE 4+ VIEW COMPARISON:  None in PACs FINDINGS: The bones are subjectively adequately mineralized. There is mild narrowing of the lateral joint compartment. There is subtle irregularity of the articular surface of the lateral tibial plateau. I cannot exclude an acute or old tibial plateau fracture. The medial and patellofemoral joint spaces are preserved. Spurs arise from the superior and inferior articular margins of the patella and from the tibial spines. There is no joint effusion. There well-marginated lucency seen on the lateral view involving the proximal tibial metaphysis which may reflect previous orthopedic procedure. IMPRESSION:  There degenerative changes of the right knee. I cannot exclude subacute or old right lateral tibial plateau fracture with mild partial depression. There no previous studies with which to compare. Further evaluation of the right knee with MRI would be useful if the patient can undergo the procedure. Electronically Signed   By: David  [place]azilandJordan M.D.   On: 04/14/2017 15:16   Dg Foot Complete Right  Result Date: 04/14/2017 CLINICAL DATA:  Right foot pain and bruising. History of multiple falls. EXAM: RIGHT FOOT COMPLETE - 3+ VIEW COMPARISON:  None in PACs FINDINGS: The bones are subjectively adequately mineralized. The phalanges and metatarsals appear intact. There is narrowing of the first metatarsophalangeal joint space. The tarsal bones are intact. There is a plantar calcaneal spur. The soft tissues exhibit no acute abnormalities. IMPRESSION: There is no acute fracture nor dislocation of the bones of the right foot. 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 Electronically Signed   By: David  [place]az[place]andJordan M.D.   On: 04/14/2017 15:17     Medications:   . sodium chloride 50 mL/hr at 04/16/17 0321  . dexmedetomidine (PRECEDEX) IV  infusion 0.2 mcg/kg/hr (04/16/17 0716)   . LORazepam      . budesonide (PULMICORT) nebulizer solution  0.25 mg Nebulization Q6H  . enoxaparin (LOVENOX) injection  40 mg Subcutaneous Q24H  . folic acid  1 mg Intravenous Daily  . ipratropium-albuterol  3 mL Nebulization Q6H  . mouth rinse  15 mL Mouth Rinse BID  . thiamine  100 mg Intravenous Daily   acetaminophen **OR** acetaminophen, albuterol, LORazepam, [DISCONTINUED] ondansetron **OR** ondansetron (ZOFRAN) IV  Assessment/ Plan:  66 y.o. male with a PMHx of Hypertension, hypoglycemia, asthma, gout, seasonal allergies, anxiety, gout and hypothyroidism, who was admitted to [facility] on 04/14/2017 for evaluation of generalized weakness, nausea, poor by mouth intake, and tremors.  1.  Severe hyponatremia,  admitting Na 105, up to 118. 2.  ETOH abuse. 3.  Altered mental status, due to hyponatremia and ETOH withdrawal. 4.  Hypertension.  Plan:  Serum sodium has improved significantly from admission. We will increase 0.9 normal saline to 75 cc per hour. Patient remains lethargic as he is on a Precedex drip however patient is arousable today.  Otherwise plan as per hospitalist in regards to his underlying alcohol abuse.   LOS: 2 Arthur Conner 6/9/201810:16 AM

## 2017-04-17 ENCOUNTER — Inpatient Hospital Stay: Payer: Medicare Other

## 2017-04-17 DIAGNOSIS — F10231 Alcohol dependence with withdrawal delirium: Principal | ICD-10-CM

## 2017-04-17 LAB — BASIC METABOLIC PANEL
Anion gap: 12 (ref 5–15)
BUN: 10 mg/dL (ref 6–20)
CHLORIDE: 93 mmol/L — AB (ref 101–111)
CO2: 23 mmol/L (ref 22–32)
Calcium: 8.4 mg/dL — ABNORMAL LOW (ref 8.9–10.3)
Creatinine, Ser: 0.82 mg/dL (ref 0.61–1.24)
GFR calc Af Amer: >60 mL/min (ref >60)
GFR calc non Af Amer: >60 mL/min (ref >60)
Glucose, Bld: 129 mg/dL — ABNORMAL HIGH (ref 65–99)
POTASSIUM: 3.8 mmol/L (ref 3.5–5.1)
SODIUM: 128 mmol/L — AB (ref 135–145)

## 2017-04-17 LAB — CBC
HEMATOCRIT: 34.6 % — AB (ref 40.0–52.0)
Hemoglobin: 12.5 g/dL — ABNORMAL LOW (ref 13.0–18.0)
MCH: 34.7 pg — ABNORMAL HIGH (ref 26.0–34.0)
MCHC: 36.2 g/dL — ABNORMAL HIGH (ref 32.0–36.0)
MCV: 95.9 fL (ref 80.0–100.0)
Platelets: 152 10*3/uL (ref 150–440)
RBC: 3.61 MIL/uL — ABNORMAL LOW (ref 4.40–5.90)
RDW: 15.4 % — AB (ref 11.5–14.5)
WBC: 8.6 10*3/uL (ref 3.8–10.6)

## 2017-04-17 MED ORDER — LORAZEPAM 2 MG/ML IJ SOLN
0.5000 mg | INTRAMUSCULAR | Status: DC | PRN
  Administered 2017-04-17: 0.5 mg via INTRAVENOUS
  Administered 2017-04-18: 1 mg via INTRAVENOUS
  Administered 2017-04-18 – 2017-04-19 (×2): 0.5 mg via INTRAVENOUS
  Filled 2017-04-17 (×3): qty 1

## 2017-04-17 MED ORDER — POTASSIUM CHLORIDE 2 MEQ/ML IV SOLN
INTRAVENOUS | Status: DC
  Administered 2017-04-17 – 2017-04-18 (×2): via INTRAVENOUS
  Filled 2017-04-17 (×3): qty 1000

## 2017-04-17 MED ORDER — LORAZEPAM 2 MG/ML IJ SOLN: 0.5000 mg | INTRAMUSCULAR | Status: DC | PRN

## 2017-04-17 MED ORDER — METHYLPREDNISOLONE SODIUM SUCC 40 MG IJ SOLR
40.0000 mg | Freq: Two times a day (BID) | INTRAMUSCULAR | Status: DC
  Administered 2017-04-18 – 2017-04-22 (×9): 40 mg via INTRAVENOUS
  Filled 2017-04-17 (×9): qty 1

## 2017-04-17 NOTE — Progress Notes (Signed)
[facility]  ROUNDING NOTE   Subjective:  Patient seen at bedside. Serum sodium up to 128 at the moment. Renal function normal. Patient remains on IV fluid hydration.   Objective:  Vital signs in last 24 hours:  Temp:  [98.4 F (36.9 C)-99.7 F (37.6 C)] 99.7 F (37.6 C) (06/10 0900) Pulse Rate:  [72-114] 105 (06/10 0900) Resp:  [14-35] 23 (06/10 0900) BP: (87-137)/(52-119) 136/80 (06/10 0900) SpO2:  [87 %-97 %] 89 % (06/10 0900)  Weight change:  Filed Weights   04/14/17 1305 04/14/17 1915  Weight: 120.2 kg (265 lb) 119.3 kg (263 lb 0.1 oz)    Intake/Output: I/O last 3 completed shifts: In: 2114.5 [I.V.:1614.5; IV Piggyback:500] Out: 375 [Urine:375]   Intake/Output this shift:  No intake/output data recorded.  Physical Exam: General: No acute distress  Head: Normocephalic, atraumatic. Moist oral mucosal membranes  Eyes: Anicteric  Neck: Supple, trachea midline  Lungs:  Clear to auscultation, normal effort  Heart: S1S2 no rubs  Abdomen:  Soft, nontender, bowel sounds present  Extremities: No peripheral edema.  Neurologic: Lethargic but arousable  Skin: Ecchymoses on bilateral lower extremeties       Basic Metabolic Panel:  Recent Labs Lab 04/15/17 0326 04/15/17 0835 04/15/17 1756 04/16/17 0425 04/17/17 0421  NA 112* 112* 113* 118* 128*  K 3.6 4.1 3.6 3.5 3.8  CL 78* 76* 81* 86* 93*  CO2 22 20* 21* 23 23  GLUCOSE 101* 104* 132* 105* 129*  BUN 11 12 12 13 10   CREATININE 0.86 0.80 0.78 0.78 0.82  CALCIUM 7.8* 8.2* 7.8* 8.0* 8.4*    Liver Function Tests:  Recent Labs Lab 04/15/17 0326  AST 109*  ALT 86*  ALKPHOS 36*  BILITOT 2.1*  PROT 5.9*  ALBUMIN 3.5    Recent Labs Lab 04/15/17 0835  LIPASE 86*   No results for input(s): AMMONIA in the last 168 hours.  CBC:  Recent Labs Lab 04/14/17 1312 04/15/17 0326 04/16/17 0425 04/17/17 0421  WBC 11.7* 8.7 6.4 8.6  HGB 12.4* 11.2* 11.2* 12.5*  HCT 34.7* 31.6* 31.0* 34.6*   MCV 93.0 94.3 94.1 95.9  PLT 133* 108* 111* 152    Cardiac Enzymes: No results for input(s): CKTOTAL, CKMB, CKMBINDEX, TROPONINI in the last 168 hours.  BNP: Invalid input(s): POCBNP  CBG:  Recent Labs Lab 04/14/17 1927  GLUCAP 97    Microbiology: Results for orders placed or performed during the hospital encounter of 04/14/17  MRSA PCR Screening     Status: None   Collection Time: 04/14/17  7:30 PM  Result Value Ref Range Status   MRSA by PCR NEGATIVE NEGATIVE Final    Comment:        The GeneXpert MRSA Assay (FDA approved for NASAL specimens only), is one component of a comprehensive MRSA colonization surveillance program. It is not intended to diagnose MRSA infection nor to guide or monitor treatment for MRSA infections.     Coagulation Studies: No results for input(s): LABPROT, INR in the last 72 hours.  Urinalysis:  Recent Labs  04/15/17 0844  COLORURINE YELLOW*  LABSPEC 1.006  PHURINE 6.0  GLUCOSEU NEGATIVE  HGBUR SMALL*  BILIRUBINUR NEGATIVE  KETONESUR 20*  PROTEINUR NEGATIVE  NITRITE NEGATIVE  LEUKOCYTESUR NEGATIVE      Imaging: Dg Eye Foreign Body  Result Date: 04/15/2017 CLINICAL DATA:  Metal exposure.  Clearance prior to MRI EXAM: ORBITS FOR FOREIGN BODY - 2 VIEW COMPARISON:  None. FINDINGS: There is no evidence of metallic  foreign body within the orbits. No significant bone abnormality identified. IMPRESSION: No evidence of metallic foreign body within the orbits. Electronically Signed   By: Tollie Ethavid  Kwon M.D.   On: 04/15/2017 18:30   Mr Knee Right Wo Contrast  Result Date: 04/16/2017 CLINICAL DATA:  Recurrent falls over the past week with bilateral knee injuries and pain. EXAM: MRI OF THE RIGHT KNEE WITHOUT CONTRAST TECHNIQUE: Multiplanar, multisequence MR imaging of the knee was performed. No intravenous contrast was administered. COMPARISON:  Plain films of the right knee 04/14/2016. FINDINGS: MENISCI Medial meniscus: The posterior horn is  severely degenerated with extensive complex tearing throughout. Horizontal and longitudinal tearing are seen in the body. Lateral meniscus: Severe complex tearing is present throughout a severely degenerated body. The anterior horn is not visualized consistent with maceration. Horizontal tear in the posterior horn is seen. LIGAMENTS Cruciates:  Intact. Collaterals:  Intact. CARTILAGE Patellofemoral: Fissuring and irregularity are seen along the lateral facet. No focal defect. Medial:  Moderately degenerated. Lateral:  Extensively thinned and irregular. Joint:  Small effusion. Popliteal Fossa:  No Baker's cyst. Extensor Mechanism:  Intact. Bones: No acute bony or joint abnormality. The patient has a mildly depressed remote tibial plateau fracture. Osteophytosis is present about the knee. Other: None. IMPRESSION: Negative for acute bony or joint abnormality. Mildly depressed and remote lateral tibial plateau fracture. Advanced medial and lateral compartment osteoarthritis with associated extensive degeneration and tearing of both the medial and lateral menisci. Electronically Signed   By: Drusilla Kannerhomas  Dalessio M.D.   On: 04/16/2017 11:31   Dg Chest Port 1 View  Result Date: 04/17/2017 CLINICAL DATA:  Respiratory failure. EXAM: PORTABLE CHEST 1 VIEW COMPARISON:  11/17/2011 radiographs FINDINGS: The cardiomediastinal silhouette is unremarkable. Right upper lung airspace disease is again noted. This is a low volume film with mild bibasilar atelectasis. Subsegmental atelectasis within the left mid lung noted. There is no evidence of pneumothorax or definite pleural effusion. Remote rib and left clavicle fractures again noted. IMPRESSION: Right upper lung airspace disease likely representing pneumonia. Mild bibasilar and left mid lung atelectasis. Electronically Signed   By: Harmon PierJeffrey  Hu M.D.   On: 04/17/2017 07:17     Medications:   . dextrose 5 %-0.9% nacl with kcl     . budesonide (PULMICORT) nebulizer solution   0.25 mg Nebulization Q6H  . enoxaparin (LOVENOX) injection  40 mg Subcutaneous Q24H  . folic acid  1 mg Intravenous Daily  . ipratropium-albuterol  3 mL Nebulization Q6H  . mouth rinse  15 mL Mouth Rinse BID  . thiamine  100 mg Intravenous Daily   acetaminophen **OR** acetaminophen, albuterol, LORazepam, [DISCONTINUED] ondansetron **OR** ondansetron (ZOFRAN) IV  Assessment/ Plan:  66 y.o. male with a PMHx of Hypertension, hypoglycemia, asthma, gout, seasonal allergies, anxiety, gout and hypothyroidism, who was admitted to [facility] on 04/14/2017 for evaluation of generalized weakness, nausea, poor by mouth intake, and tremors.  1.  Severe hyponatremia, admitting Na 105, up to 128 2.  ETOH abuse. 3.  Altered mental status, due to hyponatremia and ETOH withdrawal. 4.  Hypertension.  Plan:  Serum sodium continues to be improving as expected. Serum sodium this a.m. was 128. Continue IV fluid hydration with D5 normal saline at 50 cc per hour.  Patient is off of sedation at the moment. He is still lethargic but is arousable. Treatment of potential alcohol withdrawal as per hospitalist. We will continue to monitor along with you for now.   LOS: 3 Arthur Conner 6/10/201810:41 AM

## 2017-04-17 NOTE — Progress Notes (Signed)
[facility] - [place] at [facility]   PATIENT NAME: Arthur BakerJoseph Conner    MR#:  [id number]  DATE OF BIRTH:  November 13, 1950  SUBJECTIVE:  CHIEF COMPLAINT:   Chief Complaint  Patient presents with  . Weakness  . Nausea   - Sodium improved up to 128, mentation is much improved. Has been off of Precedex drip for more than 24 hours. - follows some simple commands. However appears tachypneic and hypoxic  REVIEW OF SYSTEMS:  Review of Systems  Unable to perform ROS: Mental status change    DRUG ALLERGIES:   Allergies  Allergen Reactions  . Toprol Xl [Metoprolol Tartrate] Other (See Comments)    Bradycardia    VITALS:  Blood pressure 122/78, pulse (!) 106, temperature 98.9 F (37.2 C), temperature source Axillary, resp. rate (!) 31, height 6\' 1"  (1.854 m), weight 119.3 kg (263 lb 0.1 oz), SpO2 92 %.  PHYSICAL EXAMINATION:  Physical Exam  GENERAL:  66 y.o.-year-old patient lying in the bed with no acute distress. Restless at times. EYES: Pupils equal, round, reactive to light and accommodation. No scleral icterus. Extraocular muscles intact.  HEENT: Head atraumatic, normocephalic. Oropharynx and nasopharynx clear.  NECK:  Supple, no jugular venous distention. No thyroid enlargement, no tenderness.  LUNGS: Normal breath sounds bilaterally, no wheezing, rales,rhonchi or crepitation. No use of accessory muscles of respiration. Decreased bibasilar breath sounds CARDIOVASCULAR: S1, S2 normal. No murmurs, rubs, or gallops.  ABDOMEN: Soft, nontender, nondistended. Bowel sounds present. No organomegaly or mass.  EXTREMITIES: No pedal edema, cyanosis, or clubbing.  NEUROLOGIC:  Cranial nerves seem to be intact, able to move all 4 extremities in bed. Sensation is intact. Following simple commands. PSYCHIATRIC: The patient is slightly more alert today, oriented x 1-2, following simple commands but not oriented.  SKIN: No obvious rash, lesion, or ulcer. Left leg is  bruised   LABORATORY PANEL:   CBC  Recent Labs Lab 04/17/17 0421  WBC 8.6  HGB 12.5*  HCT 34.6*  PLT 152   ------------------------------------------------------------------------------------------------------------------  Chemistries   Recent Labs Lab 04/15/17 0326  04/17/17 0421  NA 112*  < > 128*  K 3.6  < > 3.8  CL 78*  < > 93*  CO2 22  < > 23  GLUCOSE 101*  < > 129*  BUN 11  < > 10  CREATININE 0.86  < > 0.82  CALCIUM 7.8*  < > 8.4*  AST 109*  --   --   ALT 86*  --   --   ALKPHOS 36*  --   --   BILITOT 2.1*  --   --   < > = values in this interval not displayed. ------------------------------------------------------------------------------------------------------------------  Cardiac Enzymes No results for input(s): TROPONINI in the last 168 hours. ------------------------------------------------------------------------------------------------------------------  RADIOLOGY:  Dg Eye Foreign Body  Result Date: 04/15/2017 CLINICAL DATA:  Metal exposure.  Clearance prior to MRI EXAM: ORBITS FOR FOREIGN BODY - 2 VIEW COMPARISON:  None. FINDINGS: There is no evidence of metallic foreign body within the orbits. No significant bone abnormality identified. IMPRESSION: No evidence of metallic foreign body within the orbits. Electronically Signed   By: Tollie Ethavid  Kwon M.D.   On: 04/15/2017 18:30   Mr Knee Right Wo Contrast  Result Date: 04/16/2017 CLINICAL DATA:  Recurrent falls over the past week with bilateral knee injuries and pain. EXAM: MRI OF THE RIGHT KNEE WITHOUT CONTRAST TECHNIQUE: Multiplanar, multisequence MR imaging of the knee was performed. No intravenous contrast was administered.  COMPARISON:  Plain films of the right knee 04/14/2016. FINDINGS: MENISCI Medial meniscus: The posterior horn is severely degenerated with extensive complex tearing throughout. Horizontal and longitudinal tearing are seen in the body. Lateral meniscus: Severe complex tearing is present  throughout a severely degenerated body. The anterior horn is not visualized consistent with maceration. Horizontal tear in the posterior horn is seen. LIGAMENTS Cruciates:  Intact. Collaterals:  Intact. CARTILAGE Patellofemoral: Fissuring and irregularity are seen along the lateral facet. No focal defect. Medial:  Moderately degenerated. Lateral:  Extensively thinned and irregular. Joint:  Small effusion. Popliteal Fossa:  No Conner's cyst. Extensor Mechanism:  Intact. Bones: No acute bony or joint abnormality. The patient has a mildly depressed remote tibial plateau fracture. Osteophytosis is present about the knee. Other: None. IMPRESSION: Negative for acute bony or joint abnormality. Mildly depressed and remote lateral tibial plateau fracture. Advanced medial and lateral compartment osteoarthritis with associated extensive degeneration and tearing of both the medial and lateral menisci. Electronically Signed   By: Drusilla Kanner M.D.   On: 04/16/2017 11:31   Dg Chest Port 1 View  Result Date: 04/17/2017 CLINICAL DATA:  Respiratory failure. EXAM: PORTABLE CHEST 1 VIEW COMPARISON:  11/17/2011 radiographs FINDINGS: The cardiomediastinal silhouette is unremarkable. Right upper lung airspace disease is again noted. This is a low volume film with mild bibasilar atelectasis. Subsegmental atelectasis within the left mid lung noted. There is no evidence of pneumothorax or definite pleural effusion. Remote rib and left clavicle fractures again noted. IMPRESSION: Right upper lung airspace disease likely representing pneumonia. Mild bibasilar and left mid lung atelectasis. Electronically Signed   By: Harmon Pier M.D.   On: 04/17/2017 07:17    EKG:   Orders placed or performed during the hospital encounter of 04/14/17  . ED EKG  . ED EKG  . EKG 12-Lead  . EKG 12-Lead    ASSESSMENT AND PLAN:   66 year old male with past medical history significant for depression and anxiety, hypertension, alcohol abuse  presents to the hospital secondary to altered mental status and falls and noted to have hyponatremia.  #1 hyponatremia-likely acute, beer potomania and also poor oral intake. -slowly improving sodium, now at 128, was on NS @ 50cc/hr. - Appreciate nephrology consult.  #2 altered mental status-metabolic encephalopathy secondary to hyponatremia and DTs. -Continue to monitor. No focal deficits. Improving now, more alert and following some simple commands today -CT of the head with no acute findings.  #3 alcohol abuse with possible alcoholic liver disease-will need counseling. -Some agitation likely from withdrawals. Off Precedex drip, ativan PRN. -Psych consult pending  #4 fall-secondary to intoxication. X-rays revealed either subacute or old right tibial plateau fractures. Orthopedics consult requested. MRI showing it to be remote fracture -Physical therapy once patient is more alert.  #5 Dysphagia- per son, patient had trouble swallowing and nausea and vomiting prior to admission. -Bedside swallow study today, if any difficulty noticed he will have speech therapy to do a modified barium swallow study tomorrow  #6 tachypnea and hypoxia- check ABG, also CXR with RUL infiltrate- possible aspiration pneumonia - encourage to add zosyn/unasyn for coverage  #7 DVT prophylaxis-Lovenox   All the records are reviewed and case discussed with Care Management/Social Workerr. Management plans discussed with the patient, family and they are in agreement.  CODE STATUS: Full Code  TOTAL TIME TAKING CARE OF THIS PATIENT: 37 minutes.   POSSIBLE D/C IN 2-3 DAYS, DEPENDING ON CLINICAL CONDITION.   Enid Baas M.D on 04/17/2017 at 8:42 AM  Between 7am to 6pm - Pager - [phone]  After 6pm go to [email or web address] - Social research officer, government  [facility]  Office  [phone]  CC: Primary care physician; Steele Sizer, MD

## 2017-04-17 NOTE — Progress Notes (Signed)
PT Cancellation Note  Patient Details Name: Arthur Conner MRN: [id number] DOB: [birth year]   Cancelled Treatment:    Reason Eval/Treat Not Completed: Patient not medically ready.  Nursing recommending holding PT today.  Will re-attempt PT eval tomorrow.  Hendricks LimesEmily Mazey Mantell, PT 04/17/17, 10:13 AM [phone]

## 2017-04-17 NOTE — Progress Notes (Signed)
Less lethargic. Now intermittently agitated  Continues to follow some commands No respiratory distress  Vitals:   04/17/17 0700 04/17/17 0800 04/17/17 0829 04/17/17 0900  BP: (!) 134/119 123/77  136/80  Pulse: (!) 114 (!) 113  (!) 105  Resp: (!) 25 (!) 35  (!) 23  Temp:    99.7 F (37.6 C)  TempSrc:    Axillary  SpO2: (!) 88% (!) 88% 92% (!) 89%  Weight:      Height:       HEENT WNL JVP not visualized No wheezes Regular, no murmurs Abdomen soft, mildly tender, bowel sounds present Extremities warm, no edema No focal neurologic deficits Extensive ecchymoses on extremities and abdomen   BMP Latest Ref Rng & Units 04/17/2017 04/16/2017 04/15/2017  Glucose 65 - 99 mg/dL 161(W129(H) 960(A105(H) 540(J132(H)  BUN 6 - 20 mg/dL 10 13 12   Creatinine 0.61 - 1.24 mg/dL 8.110.82 9.140.78 7.820.78  BUN/Creat Ratio 10 - 22 - - -  Sodium 135 - 145 mmol/L 128(L) 118(LL) 113(LL)  Potassium 3.5 - 5.1 mmol/L 3.8 3.5 3.6  Chloride 101 - 111 mmol/L 93(L) 86(L) 81(L)  CO2 22 - 32 mmol/L 23 23 21(L)  Calcium 8.9 - 10.3 mg/dL 9.5(A8.4(L) 2.1(H8.0(L) 7.8(L)   CBC Latest Ref Rng & Units 04/17/2017 04/16/2017 04/15/2017  WBC 3.8 - 10.6 K/uL 8.6 6.4 8.7  Hemoglobin 13.0 - 18.0 g/dL 12.5(L) 11.2(L) 11.2(L)  Hematocrit 40.0 - 52.0 % 34.6(L) 31.0(L) 31.6(L)  Platelets 150 - 440 K/uL 152 111(L) 108(L)   IMPRESSION: Toxic-metabolic encephalopathy - improving Alcohol abuse with alcohol withdrawal Severe hyponatremia - improving Frequent falls prior to admission  PLAN/REC: Continue normal saline - added potassium to IVF's.  Begin full liquids with full supervision Continue low dose lorazepam as needed for agitation and alcohol withdrawal Continue to watch in stepdown unit at least through today as his degree of agitation exceeds what can be managed on a MedSurg floor  Billy Fischeravid Jericca Russett, MD PCCM service Mobile [phone] Pager [phone] 04/17/2017 11:12 AM

## 2017-04-17 NOTE — Progress Notes (Addendum)
Subjective:  Patient remained sedated and unable to provide history or follow commands.  A family members at the bedside. She states that he received a bath this morning. She states that he is agitated when more awake.  Objective:   VITALS:   Vitals:   04/17/17 0829 04/17/17 0900 04/17/17 1000 04/17/17 1100  BP:  136/80 123/71 114/81  Pulse:  (!) 105 (!) 101 99  Resp:  (!) 23 (!) 28 (!) 21  Temp:  99.7 F (37.6 C)    TempSrc:  Axillary    SpO2: 92% (!) 89% 92% (!) 89%  Weight:      Height:        PHYSICAL EXAM:  Exam unchanged from yesterday. Exam limited due to patient sedation. Patient with spontaneous movements of the lower extremities.  LABS  Results for orders placed or performed during the hospital encounter of 04/14/17 (from the past 24 hour(s))  Basic metabolic panel     Status: Abnormal   Collection Time: 04/17/17  4:21 AM  Result Value Ref Range   Sodium 128 (L) 135 - 145 mmol/L   Potassium 3.8 3.5 - 5.1 mmol/L   Chloride 93 (L) 101 - 111 mmol/L   CO2 23 22 - 32 mmol/L   Glucose, Bld 129 (H) 65 - 99 mg/dL   BUN 10 6 - 20 mg/dL   Creatinine, Ser 1.610.82 0.61 - 1.24 mg/dL   Calcium 8.4 (L) 8.9 - 10.3 mg/dL   GFR calc non Af Amer >60 >60 mL/min   GFR calc Af Amer >60 >60 mL/min   Anion gap 12 5 - 15  CBC     Status: Abnormal   Collection Time: 04/17/17  4:21 AM  Result Value Ref Range   WBC 8.6 3.8 - 10.6 K/uL   RBC 3.61 (L) 4.40 - 5.90 MIL/uL   Hemoglobin 12.5 (L) 13.0 - 18.0 g/dL   HCT 09.634.6 (L) 04.540.0 - 40.952.0 %   MCV 95.9 80.0 - 100.0 fL   MCH 34.7 (H) 26.0 - 34.0 pg   MCHC 36.2 (H) 32.0 - 36.0 g/dL   RDW 81.115.4 (H) 91.411.5 - 78.214.5 %   Platelets 152 150 - 440 K/uL    Dg Eye Foreign Body  Result Date: 04/15/2017 CLINICAL DATA:  Metal exposure.  Clearance prior to MRI EXAM: ORBITS FOR FOREIGN BODY - 2 VIEW COMPARISON:  None. FINDINGS: There is no evidence of metallic foreign body within the orbits. No significant bone abnormality identified. IMPRESSION: No  evidence of metallic foreign body within the orbits. Electronically Signed   By: Tollie Ethavid  Kwon M.D.   On: 04/15/2017 18:30   Mr Knee Right Wo Contrast  Result Date: 04/16/2017 CLINICAL DATA:  Recurrent falls over the past week with bilateral knee injuries and pain. EXAM: MRI OF THE RIGHT KNEE WITHOUT CONTRAST TECHNIQUE: Multiplanar, multisequence MR imaging of the knee was performed. No intravenous contrast was administered. COMPARISON:  Plain films of the right knee 04/14/2016. FINDINGS: MENISCI Medial meniscus: The posterior horn is severely degenerated with extensive complex tearing throughout. Horizontal and longitudinal tearing are seen in the body. Lateral meniscus: Severe complex tearing is present throughout a severely degenerated body. The anterior horn is not visualized consistent with maceration. Horizontal tear in the posterior horn is seen. LIGAMENTS Cruciates:  Intact. Collaterals:  Intact. CARTILAGE Patellofemoral: Fissuring and irregularity are seen along the lateral facet. No focal defect. Medial:  Moderately degenerated. Lateral:  Extensively thinned and irregular. Joint:  Small effusion. Popliteal Fossa:  No Baker's cyst. Extensor Mechanism:  Intact. Bones: No acute bony or joint abnormality. The patient has a mildly depressed remote tibial plateau fracture. Osteophytosis is present about the knee. Other: None. IMPRESSION: Negative for acute bony or joint abnormality. Mildly depressed and remote lateral tibial plateau fracture. Advanced medial and lateral compartment osteoarthritis with associated extensive degeneration and tearing of both the medial and lateral menisci. Electronically Signed   By: Drusilla Kanner M.D.   On: 04/16/2017 11:31   Dg Chest Port 1 View  Result Date: 04/17/2017 CLINICAL DATA:  Respiratory failure. EXAM: PORTABLE CHEST 1 VIEW COMPARISON:  11/17/2011 radiographs FINDINGS: The cardiomediastinal silhouette is unremarkable. Right upper lung airspace disease is again  noted. This is a low volume film with mild bibasilar atelectasis. Subsegmental atelectasis within the left mid lung noted. There is no evidence of pneumothorax or definite pleural effusion. Remote rib and left clavicle fractures again noted. IMPRESSION: Right upper lung airspace disease likely representing pneumonia. Mild bibasilar and left mid lung atelectasis. Electronically Signed   By: Harmon Pier M.D.   On: 04/17/2017 07:17    Assessment/Plan:    Patient's MRI has revealed no fracture in the lateral tibial plateau of the right knee. He has degenerative changes in the lateral compartment.  No surgical intervention is required at this point.  He will need physical therapy evaluation once he is able to participate. Patient sodium today is 128.    Active Problems:   Hyponatremia    Juanell Fairly , MD 04/17/2017, 11:39 AM

## 2017-04-18 ENCOUNTER — Inpatient Hospital Stay: Payer: Medicare Other

## 2017-04-18 DIAGNOSIS — S270XXA Traumatic pneumothorax, initial encounter: Secondary | ICD-10-CM

## 2017-04-18 DIAGNOSIS — J449 Chronic obstructive pulmonary disease, unspecified: Secondary | ICD-10-CM

## 2017-04-18 LAB — BLOOD GAS, ARTERIAL
ACID-BASE EXCESS: 4.8 mmol/L — AB (ref 0.0–2.0)
BICARBONATE: 29.9 mmol/L — AB (ref 20.0–28.0)
FIO2: 0.52
O2 SAT: 91.7 %
PCO2 ART: 45 mmHg (ref 32.0–48.0)
PO2 ART: 61 mmHg — AB (ref 83.0–108.0)
Patient temperature: 37
pH, Arterial: 7.43 (ref 7.350–7.450)

## 2017-04-18 LAB — PROCALCITONIN

## 2017-04-18 LAB — CBC
HEMATOCRIT: 33.2 % — AB (ref 40.0–52.0)
Hemoglobin: 11.8 g/dL — ABNORMAL LOW (ref 13.0–18.0)
MCH: 34.5 pg — AB (ref 26.0–34.0)
MCHC: 35.5 g/dL (ref 32.0–36.0)
MCV: 97.2 fL (ref 80.0–100.0)
Platelets: 170 10*3/uL (ref 150–440)
RBC: 3.41 MIL/uL — ABNORMAL LOW (ref 4.40–5.90)
RDW: 15.4 % — AB (ref 11.5–14.5)
WBC: 6.9 10*3/uL (ref 3.8–10.6)

## 2017-04-18 LAB — BASIC METABOLIC PANEL
Anion gap: 9 (ref 5–15)
BUN: 12 mg/dL (ref 6–20)
CALCIUM: 8.6 mg/dL — AB (ref 8.9–10.3)
CO2: 25 mmol/L (ref 22–32)
CREATININE: 0.65 mg/dL (ref 0.61–1.24)
Chloride: 100 mmol/L — ABNORMAL LOW (ref 101–111)
GFR calc Af Amer: >60 mL/min (ref >60)
GFR calc non Af Amer: >60 mL/min (ref >60)
GLUCOSE: 182 mg/dL — AB (ref 65–99)
Potassium: 4.2 mmol/L (ref 3.5–5.1)
Sodium: 134 mmol/L — ABNORMAL LOW (ref 135–145)

## 2017-04-18 LAB — MAGNESIUM: Magnesium: 1.8 mg/dL (ref 1.7–2.4)

## 2017-04-18 MED ORDER — FENTANYL CITRATE (PF) 100 MCG/2ML IJ SOLN
INTRAMUSCULAR | Status: AC
  Administered 2017-04-18: 50 ug via INTRAVENOUS
  Filled 2017-04-18: qty 2

## 2017-04-18 MED ORDER — MAGNESIUM SULFATE 2 GM/50ML IV SOLN
2.0000 g | Freq: Once | INTRAVENOUS | Status: AC
  Administered 2017-04-18: 2 g via INTRAVENOUS
  Filled 2017-04-18: qty 50

## 2017-04-18 MED ORDER — LIDOCAINE HCL (PF) 2 % IJ SOLN
5.0000 mL | Freq: Once | INTRAMUSCULAR | Status: AC
  Administered 2017-04-18: 5 mL via INTRADERMAL

## 2017-04-18 MED ORDER — ADULT MULTIVITAMIN LIQUID CH
15.0000 mL | Freq: Every day | ORAL | Status: DC
  Administered 2017-04-18 – 2017-04-21 (×4): 15 mL via ORAL
  Filled 2017-04-18 (×4): qty 15

## 2017-04-18 MED ORDER — BLISTEX MEDICATED EX OINT
TOPICAL_OINTMENT | CUTANEOUS | Status: DC | PRN
  Filled 2017-04-18: qty 6.3

## 2017-04-18 MED ORDER — PNEUMOCOCCAL VAC POLYVALENT 25 MCG/0.5ML IJ INJ
0.5000 mL | INJECTION | INTRAMUSCULAR | Status: AC
  Administered 2017-04-19: 0.5 mL via INTRAMUSCULAR
  Filled 2017-04-18: qty 0.5

## 2017-04-18 MED ORDER — ENSURE ENLIVE PO LIQD
237.0000 mL | Freq: Two times a day (BID) | ORAL | Status: DC
  Administered 2017-04-18 – 2017-04-21 (×7): 237 mL via ORAL

## 2017-04-18 MED ORDER — FENTANYL CITRATE (PF) 100 MCG/2ML IJ SOLN
25.0000 ug | INTRAMUSCULAR | Status: DC | PRN
  Administered 2017-04-18: 50 ug via INTRAVENOUS

## 2017-04-18 MED ORDER — FUROSEMIDE 10 MG/ML IJ SOLN
40.0000 mg | Freq: Once | INTRAMUSCULAR | Status: AC
  Administered 2017-04-18: 40 mg via INTRAVENOUS
  Filled 2017-04-18: qty 4

## 2017-04-18 MED ORDER — PIPERACILLIN-TAZOBACTAM 4.5 G IVPB
4.5000 g | Freq: Once | INTRAVENOUS | Status: AC
  Administered 2017-04-18: 4.5 g via INTRAVENOUS
  Filled 2017-04-18 (×2): qty 100

## 2017-04-18 MED ORDER — PIPERACILLIN-TAZOBACTAM 3.375 G IVPB
3.3750 g | Freq: Three times a day (TID) | INTRAVENOUS | Status: DC
  Administered 2017-04-18 – 2017-04-19 (×4): 3.375 g via INTRAVENOUS
  Filled 2017-04-18 (×6): qty 50

## 2017-04-18 NOTE — Care Management (Deleted)
[employer] U[employer]urse Lisa's number PH#: [phone]<tel:[phone]- emailed to Dr. Ardyth Manam

## 2017-04-18 NOTE — Procedures (Addendum)
PROCEDURE NOTE: RIGHT THORACOSTOMY TUBE PLACEMENT    Date: 04/18/2017,.  ZOX:[id number]:IC10A/IC10A-AA MRN# [id number] [name]    Indication: Pneumothorax    A time-out was completed verifying correct patient, procedure, site, positioning, special catheter was available at the time of the procedure.  The patient was positioned appropriately for chest tube placement. The patient's right chest was prepped and draped in sterile fashion. 1% Lidocaine was used to anesthetize the surrounding skin area along in the 5th interspace 2 cm lateral to the mid clavicular line on the right side.    A 64F thoracostomy tube was placed and was sutured securely to the skin and a sterile dressing applied. A pleurevac was attached to the chest tube and a chest x-ray obtained.  Wells Guileseep Shruti Arrey, MD.   Board Certified in Internal Medicine, Pulmonary Medicine, Critical Care Medicine, and Sleep Medicine.  [facility] Office Number: [phone] Pager: [phone]  Santiago Gladavid Kasa, M.D.  Billy Fischeravid Simonds, M.D   04/18/2017

## 2017-04-18 NOTE — Progress Notes (Signed)
[facility] [place] [facility]   PATIENT NAME: Arthur Conner    MR#:  [id number]  DATE OF BIRTH:  May 05, 1951  SUBJECTIVE:  CHIEF COMPLAINT:   Chief Complaint  Patient presents with  . Weakness  . Nausea   - Sodium improved up to 128, mentation is much improved. Has been off of Precedex drip for more than 24 hours. - follows some simple commands. However appears tachypneic and hypoxic  REVIEW OF SYSTEMS:  Review of Systems  Unable to perform ROS: Mental status change    DRUG ALLERGIES:   Allergies  Allergen Reactions  . Toprol Xl [Metoprolol Tartrate] Other (See Comments)    Bradycardia    VITALS:  Blood pressure 114/65, pulse 93, temperature 98.9 F (37.2 C), temperature source Axillary, resp. rate (!) 27, height 6\' 1"  (1.854 m), weight 119.3 kg (263 lb 0.1 oz), SpO2 92 %.  PHYSICAL EXAMINATION:  Physical Exam  GENERAL:  66 y.o.-year-old patient lying in the bed with no acute distress. Restless at times. EYES: Pupils equal, round, reactive to light and accommodation. No scleral icterus. Extraocular muscles intact.  HEENT: Head atraumatic, normocephalic. Oropharynx and nasopharynx clear.  NECK:  Supple, no jugular venous distention. No thyroid enlargement, no tenderness.  LUNGS: Normal breath sounds bilaterally, no wheezing, rales,rhonchi or crepitation. No use of accessory muscles of respiration. Decreased bibasilar breath sounds CT placement right side CARDIOVASCULAR: S1, S2 normal. No murmurs, rubs, or gallops.  ABDOMEN: Soft, nontender, nondistended. Bowel sounds present. No organomegaly or mass.  EXTREMITIES: No pedal edema, cyanosis, or clubbing.  NEUROLOGIC:  Cranial nerves seem to be intact, able to move all 4 extremities in bed. Sensation is intact. Following simple commands. PSYCHIATRIC: The patient is slightly more alert today, oriented x 1-2, following simple commands but not oriented.  SKIN: No obvious rash, lesion, or ulcer. Left  leg is bruised   LABORATORY PANEL:   CBC  Recent Labs Lab 04/18/17 0405  WBC 6.9  HGB 11.8*  HCT 33.2*  PLT 170   ------------------------------------------------------------------------------------------------------------------  Chemistries   Recent Labs Lab 04/15/17 0326  04/18/17 0405  NA 112*  < > 134*  K 3.6  < > 4.2  CL 78*  < > 100*  CO2 22  < > 25  GLUCOSE 101*  < > 182*  BUN 11  < > 12  CREATININE 0.86  < > 0.65  CALCIUM 7.8*  < > 8.6*  MG  --   --  1.8  AST 109*  --   --   ALT 86*  --   --   ALKPHOS 36*  --   --   BILITOT 2.1*  --   --   < > = values in this interval not displayed. ------------------------------------------------------------------------------------------------------------------  Cardiac Enzymes No results for input(s): TROPONINI in the last 168 hours. ------------------------------------------------------------------------------------------------------------------  RADIOLOGY:  Dg Chest 1 View  Result Date: 04/18/2017 CLINICAL DATA:  66 year old male with alcohol withdrawal. Multiple fall. EXAM: CHEST 1 VIEW COMPARISON:  04/18/2017. FINDINGS: Interval placement of right-sided chest tube. There has been decrease in volume of the right-sided pneumothorax which measures approximately 1.2 cm in thickness over the apex. The heart size appears normal. Bilateral airspace opacities are unchanged from previous exam. Chronic appearing right posterior rib fracture deformity noted. IMPRESSION: 1. Decrease in volume of right-sided pneumothorax status post chest tube placement. 2. No change in bilateral airspace opacities. Electronically Signed   By: Signa Kellaylor  Stroud M.D.   On: 04/18/2017 08:43  Dg Chest Port 1 View  Result Date: 04/18/2017 CLINICAL DATA:  Follow-up examination for respiratory failure. EXAM: PORTABLE CHEST 1 VIEW COMPARISON:  Prior radiograph from 04/17/2017. FINDINGS: Stable cardiomegaly.  Mediastinal silhouette normal. Lungs  hypoinflated. Perihilar vascular and interstitial congestion. , progressed from previous. Persistent right upper lobe opacity, slightly worsened. New left perihilar opacity, which may reflect edema or additional infiltrate. There is a new mild to moderate size right pneumothorax. No mediastinal shift or other complication. Suspected small pleural effusions. No acute osseus abnormality. IMPRESSION: 1. New small moderate right-sided pneumothorax. No mediastinal shift or other complication. 2. Persistent right upper lobe airspace opacity, concerning for pneumonia. 3. Interval worsening in underlying vascular and interstitial congestion. Probable small bilateral pleural effusions. Critical Value/emergent results were called by telephone at the time of interpretation on 04/18/2017 at 6:27 am to Dr. Darcel Bayley , who verbally acknowledged these results. Electronically Signed   By: Rise Mu M.D.   On: 04/18/2017 06:28   Dg Chest Port 1 View  Result Date: 04/17/2017 CLINICAL DATA:  Respiratory failure. EXAM: PORTABLE CHEST 1 VIEW COMPARISON:  11/17/2011 radiographs FINDINGS: The cardiomediastinal silhouette is unremarkable. Right upper lung airspace disease is again noted. This is a low volume film with mild bibasilar atelectasis. Subsegmental atelectasis within the left mid lung noted. There is no evidence of pneumothorax or definite pleural effusion. Remote rib and left clavicle fractures again noted. IMPRESSION: Right upper lung airspace disease likely representing pneumonia. Mild bibasilar and left mid lung atelectasis. Electronically Signed   By: Harmon Pier M.D.   On: 04/17/2017 07:17    EKG:   Orders placed or performed during the hospital encounter of 04/14/17  . ED EKG  . ED EKG  . EKG 12-Lead  . EKG 12-Lead    ASSESSMENT AND PLAN:   66 year old male with past medical history significant for depression and anxiety, hypertension, alcohol abuse presents to the hospital secondary to altered  mental status and falls and noted to have hyponatremia.  #1 hyponatremia-likely acute, beer potomania and also poor oral intake. -slowly improving sodium, now at 134, was on NS @ 50cc/hr. - Appreciate nephrology consult. -came in with critically low sodium of 105---113--118--128--134  #2 altered mental status-metabolic encephalopathy secondary to hyponatremia and DTs. -Continue to monitor. No focal deficits. Improving now, more alert and following some simple commands today -CT of the head with no acute findings.  #3 alcohol abuse with possible alcoholic liver disease-will need counseling. -Some agitation likely from withdrawals. Off Precedex drip, ativan PRN. -Psych consult pending  #4 fall-secondary to intoxication. X-rays revealed either subacute or old right tibial plateau fractures. Orthopedics consult requested. MRI showing it to be remote fracture -Physical therapy once patient is more alert.  #5 Dysphagia- per son, patient had trouble swallowing and nausea and vomiting prior to admission. -Bedside swallow study today, if any difficulty noticed he will have speech therapy to do a modified barium swallow study tomorrow  #6 tachypnea and hypoxia- - CXR with RUL infiltrate- possible aspiration pneumonia - encourage to add zosyn/unasyn for coverage -CXR showed right UL PTX now has CT placed on 04/18/17  #7 DVT prophylaxis-Lovenox   All the records are reviewed and case discussed with Care Management/Social Workerr. Management plans discussed with the patient, family and they are in agreement.  CODE STATUS: Full Code  TOTAL TIME TAKING CARE OF THIS PATIENT: 35 minutes.     Sheily Lineman M.D on 04/18/2017 at 4:46 PM  Between 7am to 6pm - Pager - [phone]  After 6pm go to [email or web address] - Social research officer, government  [facility]  Office  [phone]  CC: Primary care physician; Steele Sizer, MD

## 2017-04-18 NOTE — Progress Notes (Signed)
BRIEF PATIENT DESCRIPTION:  66 yo male admitted 06/7 with severe hyponatremia, ETOH withdrawal, and multiple falls  REVIEW OF SYSTEMS:  Unable to assess pt remains delirious    SUBJECTIVE: Pt resting comfortably on HFNC    Vitals:   04/18/17 0000 04/18/17 0100 04/18/17 0200 04/18/17 0300  BP: 120/64 116/77 116/85   Pulse: (!) 106 (!) 105 99   Resp: (!) 26 (!) 30 (!) 27   Temp:   98.6 F (37 C)   TempSrc:   Oral   SpO2: (!) 88% 91% 91% 91%  Weight:      Height:       PHYSICAL EXAMINATION: General:  Morbidly obese, in no acute distress Neuro: lethargic, not following commands, PERRL HEENT: AT,[place], No JVD Cardiovascular: sinus tach, no M/R/G Lungs: rhonchi with expiratory wheezes throughout, even, non labored on HFNC Abdomen: +BS x4, soft, non distended, non tender  Musculoskeletal:  No edema,cyanosis noted Skin:  Multiple bruises all over body, scabbed abrasions bilateral knees  BMP Latest Ref Rng & Units 04/18/2017 04/17/2017 04/16/2017  Glucose 65 - 99 mg/dL 657(Q182(H) 469(G129(H) 295(M105(H)  BUN 6 - 20 mg/dL 12 10 13   Creatinine 0.61 - 1.24 mg/dL 8.410.65 3.240.82 4.010.78  BUN/Creat Ratio 10 - 22 - - -  Sodium 135 - 145 mmol/L 134(L) 128(L) 118(LL)  Potassium 3.5 - 5.1 mmol/L 4.2 3.8 3.5  Chloride 101 - 111 mmol/L 100(L) 93(L) 86(L)  CO2 22 - 32 mmol/L 25 23 23   Calcium 8.9 - 10.3 mg/dL 0.2(V8.6(L) 2.5(D8.4(L) 6.6(Y8.0(L)   CBC Latest Ref Rng & Units 04/18/2017 04/17/2017 04/16/2017  WBC 3.8 - 10.6 K/uL 6.9 8.6 6.4  Hemoglobin 13.0 - 18.0 g/dL 11.8(L) 12.5(L) 11.2(L)  Hematocrit 40.0 - 52.0 % 33.2(L) 34.6(L) 31.0(L)  Platelets 150 - 440 K/uL 170 152 111(L)   IMPRESSION: Acute hypoxic respiratory failure secondary to pneumonia Toxic-metabolic encephalopathy - improving Alcohol abuse with alcohol withdrawal Severe hyponatremia - improving Frequent falls prior to admission   PLAN/REC: HFNC or supplemental O2 as needed to maintain O2 sats >92% or for dyspnea Continue scheduled bronchodilator therapy and  nebulized steroids Continue iv steroids wean as tolerated  Zosyn for pneumonia Repeat cxr Trend WBC and monitor fever curve Trend PCT Continue D5NS 10 meq K+@ 50 ml/hr  Continue full liquid diet with aspiration precautions and nursing supervision  Low dose lorazepam as needed for agitation and alcohol withdrawal If pt remains stable may transfer out of the Stepdown Unit today 06/11  Sonda Rumbleana Blakeney, AGNP  Pulmonary/Critical Care Pager [phone] (please enter 7 digits) PCCM Consult Pager [phone] (please enter 7 digits)  STAFF NOTE: I. Dr. Nicholos Johnsamachandran, have personally reviewed the patient's available data including medical history , events of notes, physican examination and test results as part of my evaluation. I have discussed with the  Care with the NP and other care providers including  pharmacist, ICU RN, RRT, dietary.  Physical Exam  Lungs - Decreased air entry bilaterally with bilateral creps. Pt is lethargic but arousable and tries to answer questions.  He is noted to have a right pneumothorax, there is bruising on the chest wall, likely from trauma.  Will continue treatment of pneumonia, will place chest tube, wean down high flow as tolerated.   Wells Guileseep Ettie Krontz, MD.   Board Certified in Internal Medicine, Pulmonary Medicine, Critical Care Medicine, and Sleep Medicine.  [facility] Office Number: [phone] Pager: [phone]  Santiago Gladavid Kasa, M.D.  Billy Fischeravid Simonds, M.D 04/18/2017

## 2017-04-18 NOTE — Consult Note (Signed)
Psychiatry: Came by to follow-up with patient who has been sedated for several days now. Patient is still only minimally interactive not able to engage in full psychiatric interview. Continue with current detox plans and stabilization and we will follow-up again tomorrow.

## 2017-04-18 NOTE — Progress Notes (Signed)
At beginning of shift. Pt sats at 88%. [place] increased to 6L, sats 90-92%. Couple hours later pt began to desat to high 80s again. Lung sounds Rhonchi audible on right side, wheezing on left. Simple mask applied but pt not tolerating and removing mask as still present confusion at this time. NP made aware. Order for Zosyn, and Solu-medrol, started. Also order to place on HFNC to see if more tolerable by pt. Son at beside updated by writing nurse and NP.

## 2017-04-18 NOTE — Progress Notes (Signed)
PT Cancellation Note  Patient Details Name: Arthur Conner MRN: [id number] DOB: 19-Nov-1950   Cancelled Treatment:    Reason Eval/Treat Not Completed: Patient declined, no reason specified;Patient's level of consciousness.  PT consult received.  Chart reviewed.  Nursing cleared pt for participation in PT.  Upon entering pt's room, pt soundly sleeping and PT unable to wake pt up with vc's and tactile cues.  Pt's son, pt's sister, and pt's aunt present.  Following information provided by pt's son:  Pt and pt's son live together in 1 level home with 3 STE with L railing.  Pt using RW prior to admission.  Has a BSC, walk in shower (no grab bars or shower seat currently but pt's son looking into this), and comfort height toilet (no grab bars).  Pt became more alert while PT was in room (but still appearing lethargic).  Pt encouraged to participate by PT and pt's son but pt declining PT today d/t fatigue.  Will re-attempt PT eval tomorrow.  Hendricks LimesEmily Etheridge Geil, PT 04/18/17, 4:46 PM [phone]

## 2017-04-18 NOTE — Progress Notes (Signed)
[facility]  ROUNDING NOTE   Subjective:   Son at bedside.  Na 134   HFNC   D5NS with 10KCl at 4150mL/hr   Objective:  Vital signs in last 24 hours:  Temp:  [97.4 F (36.3 C)-99.8 F (37.7 C)] 98.4 F (36.9 C) (06/11 0900) Pulse Rate:  [90-111] 97 (06/11 0903) Resp:  [17-34] 17 (06/11 0903) BP: (107-135)/(64-91) 113/79 (06/11 0903) SpO2:  [88 %-96 %] 92 % (06/11 0903) FiO2 (%):  [50 %-52 %] 50 % (06/11 0805)  Weight change:  Filed Weights   04/14/17 1305 04/14/17 1915  Weight: 120.2 kg (265 lb) 119.3 kg (263 lb 0.1 oz)    Intake/Output: I/O last 3 completed shifts: In: 1200 [I.V.:1200] Out: -    Intake/Output this shift:  No intake/output data recorded.  Physical Exam: General: No acute distress  Head: Normocephalic, atraumatic. Moist oral mucosal membranes  Eyes: Anicteric  Neck: Supple, trachea midline  Lungs:  Clear to auscultation, normal effort, HFNC  Heart: S1S2 no rubs  Abdomen:  Soft, nontender, bowel sounds present  Extremities: No peripheral edema.  Neurologic: Lethargic    Skin: Ecchymoses on bilateral lower extremeties       Basic Metabolic Panel:  Recent Labs Lab 04/15/17 0835 04/15/17 1756 04/16/17 0425 04/17/17 0421 04/18/17 0405  NA 112* 113* 118* 128* 134*  K 4.1 3.6 3.5 3.8 4.2  CL 76* 81* 86* 93* 100*  CO2 20* 21* 23 23 25   GLUCOSE 104* 132* 105* 129* 182*  BUN 12 12 13 10 12   CREATININE 0.80 0.78 0.78 0.82 0.65  CALCIUM 8.2* 7.8* 8.0* 8.4* 8.6*    Liver Function Tests:  Recent Labs Lab 04/15/17 0326  AST 109*  ALT 86*  ALKPHOS 36*  BILITOT 2.1*  PROT 5.9*  ALBUMIN 3.5    Recent Labs Lab 04/15/17 0835  LIPASE 86*   No results for input(s): AMMONIA in the last 168 hours.  CBC:  Recent Labs Lab 04/14/17 1312 04/15/17 0326 04/16/17 0425 04/17/17 0421 04/18/17 0405  WBC 11.7* 8.7 6.4 8.6 6.9  HGB 12.4* 11.2* 11.2* 12.5* 11.8*  HCT 34.7* 31.6* 31.0* 34.6* 33.2*  MCV 93.0 94.3 94.1 95.9  97.2  PLT 133* 108* 111* 152 170    Cardiac Enzymes: No results for input(s): CKTOTAL, CKMB, CKMBINDEX, TROPONINI in the last 168 hours.  BNP: Invalid input(s): POCBNP  CBG:  Recent Labs Lab 04/14/17 1927  GLUCAP 97    Microbiology: Results for orders placed or performed during the hospital encounter of 04/14/17  MRSA PCR Screening     Status: None   Collection Time: 04/14/17  7:30 PM  Result Value Ref Range Status   MRSA by PCR NEGATIVE NEGATIVE Final    Comment:        The GeneXpert MRSA Assay (FDA approved for NASAL specimens only), is one component of a comprehensive MRSA colonization surveillance program. It is not intended to diagnose MRSA infection nor to guide or monitor treatment for MRSA infections.     Coagulation Studies: No results for input(s): LABPROT, INR in the last 72 hours.  Urinalysis: No results for input(s): COLORURINE, LABSPEC, PHURINE, GLUCOSEU, HGBUR, BILIRUBINUR, KETONESUR, PROTEINUR, UROBILINOGEN, NITRITE, LEUKOCYTESUR in the last 72 hours.  Invalid input(s): APPERANCEUR    Imaging: Dg Chest 1 View  Result Date: 04/18/2017 CLINICAL DATA:  66 year old male with alcohol withdrawal. Multiple fall. EXAM: CHEST 1 VIEW COMPARISON:  04/18/2017. FINDINGS: Interval placement of right-sided chest tube. There has been decrease in volume  of the right-sided pneumothorax which measures approximately 1.2 cm in thickness over the apex. The heart size appears normal. Bilateral airspace opacities are unchanged from previous exam. Chronic appearing right posterior rib fracture deformity noted. IMPRESSION: 1. Decrease in volume of right-sided pneumothorax status post chest tube placement. 2. No change in bilateral airspace opacities. Electronically Signed   By: Signa Kell M.D.   On: 04/18/2017 08:43   Mr Knee Right Wo Contrast  Result Date: 04/16/2017 CLINICAL DATA:  Recurrent falls over the past week with bilateral knee injuries and pain. EXAM: MRI OF  THE RIGHT KNEE WITHOUT CONTRAST TECHNIQUE: Multiplanar, multisequence MR imaging of the knee was performed. No intravenous contrast was administered. COMPARISON:  Plain films of the right knee 04/14/2016. FINDINGS: MENISCI Medial meniscus: The posterior horn is severely degenerated with extensive complex tearing throughout. Horizontal and longitudinal tearing are seen in the body. Lateral meniscus: Severe complex tearing is present throughout a severely degenerated body. The anterior horn is not visualized consistent with maceration. Horizontal tear in the posterior horn is seen. LIGAMENTS Cruciates:  Intact. Collaterals:  Intact. CARTILAGE Patellofemoral: Fissuring and irregularity are seen along the lateral facet. No focal defect. Medial:  Moderately degenerated. Lateral:  Extensively thinned and irregular. Joint:  Small effusion. Popliteal Fossa:  No Baker's cyst. Extensor Mechanism:  Intact. Bones: No acute bony or joint abnormality. The patient has a mildly depressed remote tibial plateau fracture. Osteophytosis is present about the knee. Other: None. IMPRESSION: Negative for acute bony or joint abnormality. Mildly depressed and remote lateral tibial plateau fracture. Advanced medial and lateral compartment osteoarthritis with associated extensive degeneration and tearing of both the medial and lateral menisci. Electronically Signed   By: Drusilla Kanner M.D.   On: 04/16/2017 11:31   Dg Chest Port 1 View  Result Date: 04/18/2017 CLINICAL DATA:  Follow-up examination for respiratory failure. EXAM: PORTABLE CHEST 1 VIEW COMPARISON:  Prior radiograph from 04/17/2017. FINDINGS: Stable cardiomegaly.  Mediastinal silhouette normal. Lungs hypoinflated. Perihilar vascular and interstitial congestion. , progressed from previous. Persistent right upper lobe opacity, slightly worsened. New left perihilar opacity, which may reflect edema or additional infiltrate. There is a new mild to moderate size right  pneumothorax. No mediastinal shift or other complication. Suspected small pleural effusions. No acute osseus abnormality. IMPRESSION: 1. New small moderate right-sided pneumothorax. No mediastinal shift or other complication. 2. Persistent right upper lobe airspace opacity, concerning for pneumonia. 3. Interval worsening in underlying vascular and interstitial congestion. Probable small bilateral pleural effusions. Critical Value/emergent results were called by telephone at the time of interpretation on 04/18/2017 at 6:27 am to Dr. Darcel Bayley , who verbally acknowledged these results. Electronically Signed   By: Rise Mu M.D.   On: 04/18/2017 06:28   Dg Chest Port 1 View  Result Date: 04/17/2017 CLINICAL DATA:  Respiratory failure. EXAM: PORTABLE CHEST 1 VIEW COMPARISON:  11/17/2011 radiographs FINDINGS: The cardiomediastinal silhouette is unremarkable. Right upper lung airspace disease is again noted. This is a low volume film with mild bibasilar atelectasis. Subsegmental atelectasis within the left mid lung noted. There is no evidence of pneumothorax or definite pleural effusion. Remote rib and left clavicle fractures again noted. IMPRESSION: Right upper lung airspace disease likely representing pneumonia. Mild bibasilar and left mid lung atelectasis. Electronically Signed   By: Harmon Pier M.D.   On: 04/17/2017 07:17     Medications:   . dextrose 5 %-0.9% nacl with kcl 50 mL/hr at 04/18/17 0658  . piperacillin-tazobactam (ZOSYN)  IV 3.375 g (  04/18/17 0917)   . budesonide (PULMICORT) nebulizer solution  0.25 mg Nebulization Q6H  . enoxaparin (LOVENOX) injection  40 mg Subcutaneous Q24H  . folic acid  1 mg Intravenous Daily  . ipratropium-albuterol  3 mL Nebulization Q6H  . mouth rinse  15 mL Mouth Rinse BID  . methylPREDNISolone (SOLU-MEDROL) injection  40 mg Intravenous Q12H  . [START ON 04/19/2017] pneumococcal 23 valent vaccine  0.5 mL Intramuscular Tomorrow-1000  . thiamine  100 mg  Intravenous Daily   acetaminophen **OR** acetaminophen, albuterol, fentaNYL (SUBLIMAZE) injection, LORazepam, [DISCONTINUED] ondansetron **OR** ondansetron (ZOFRAN) IV  Assessment/ Plan:  66 y.o. white male with Hypertension, hypoglycemia, asthma/COPD, gout, seasonal allergies, anxiety, gout and hypothyroidism, who was admitted to [facility] on 04/14/2017   1.  Severe hyponatremia, admitting Na 105 2.  ETOH abuse. 3.  Altered mental status /delirium 4.  Hypertension. 5.  Pneumonia/ Acute COPD exacerbation: on IV abx and solumedrol  Plan:  - Discontinue IV fluids - Encourage PO intake - Check ABG due to mental status changes - Discontinue hydrochlorothiazide on discharge.    LOS: 4 Jamerson Vonbargen 6/11/201810:10 AM

## 2017-04-18 NOTE — Progress Notes (Signed)
eLink Physician-Brief Progress Note Patient Name: Arthur AversJoseph L Conner DOB: 06-Mar-1951 MRN: [id number]   Date of Service  04/18/2017  HPI/Events of Note  Am CXR shows Rt pneumothorax On HFNC  eICU Interventions  Will need chest tube Bedside team aware     Intervention Category Intermediate Interventions: Diagnostic test evaluation  Arthur Conner. 04/18/2017, 6:26 AM

## 2017-04-18 NOTE — Progress Notes (Signed)
Pharmacy Antibiotic Note  Florestine AversJoseph L Rampy is a 66 y.o. male admitted on 04/14/2017 with pneumonia.  Pharmacy has been consulted for zosyn dosing.  Plan: Will give Zosyn 4.5g IV x 1  Will follow-up w/ zosyn 3.375g IV q8h extended infusion for aspiration pneumonia  Height: 6\' 1"  (185.4 cm) Weight: 263 lb 0.1 oz (119.3 kg) IBW/kg (Calculated) : 79.9  Temp (24hrs), Avg:99.1 F (37.3 C), Min:97.4 F (36.3 C), Max:99.8 F (37.7 C)   Recent Labs Lab 04/14/17 1312  04/15/17 0326 04/15/17 0835 04/15/17 1756 04/16/17 0425 04/17/17 0421  WBC 11.7*  --  8.7  --   --  6.4 8.6  CREATININE 0.86  < > 0.86 0.80 0.78 0.78 0.82  < > = values in this interval not displayed.  Estimated Creatinine Clearance: 121.6 mL/min (by C-G formula based on SCr of 0.82 mg/dL).    Allergies  Allergen Reactions  . Toprol Xl [Metoprolol Tartrate] Other (See Comments)    Bradycardia     Thank you for allowing pharmacy to be a part of this patient's care.  Thomasene RippleDavid  Romel Dumond 04/18/2017 12:13 AM

## 2017-04-18 NOTE — Progress Notes (Signed)
MEDICATION RELATED CONSULT NOTE    Pharmacy Consult for electrolyte monitoring   Pharmacy consulted for electrolyte monitoring for 66 yo male admitted with hyponatremia and alcohol withdrawal. Patient previously receiving potassium in MIVF.   Plan: Will replace magnesium 2g IV x1 to maintain magnesium > 2. Will recheck all electrolytes with am labs.    Allergies  Allergen Reactions  . Toprol Xl [Metoprolol Tartrate] Other (See Comments)    Bradycardia    Patient Measurements: Height: 6\' 1"  (185.4 cm) Weight: 263 lb 0.1 oz (119.3 kg) IBW/kg (Calculated) : 79.9  Vital Signs: Temp: 98.9 F (37.2 C) (06/11 1500) Temp Source: Axillary (06/11 1500) BP: 114/65 (06/11 1500) Pulse Rate: 93 (06/11 1500) Intake/Output from previous day: 06/10 0701 - 06/11 0700 In: 950 [I.V.:950] Out: -  Intake/Output from this shift: Total I/O In: 580 [P.O.:480; IV Piggyback:100] Out: -   Labs:  Recent Labs  04/16/17 0425 04/17/17 0421 04/18/17 0405  WBC 6.4 8.6 6.9  HGB 11.2* 12.5* 11.8*  HCT 31.0* 34.6* 33.2*  PLT 111* 152 170  CREATININE 0.78 0.82 0.65  MG  --   --  1.8   Estimated Creatinine Clearance: 124.6 mL/min (by C-G formula based on SCr of 0.65 mg/dL).   Pharmacy will continue to monitor and adjust per consult.   Arthur Conner L 04/18/2017,4:30 PM

## 2017-04-18 NOTE — Progress Notes (Signed)
Nutrition Follow-up Note  DOCUMENTATION CODES:   Obesity unspecified  INTERVENTION:   Ensure Enlive po BID, each supplement provides 350 kcal and 20 grams of protein  Magic cup TID with meals, each supplement provides 290 kcal and 9 grams of protein  MVI  NUTRITION DIAGNOSIS:   Inadequate oral intake related to acute illness (etoh abuse, PNA, COPD) as evidenced by meal completion < 25%  GOAL:   Patient will meet greater than or equal to 90% of their needs  MONITOR:   PO intake, Supplement acceptance, Labs, Weight trends    ASSESSMENT:   66 yo male with hypertension, hypoglycemia, asthma/COPD, gout, seasonal allergies, anxiety, gout and hypothyroidism admitted 06/7 with severe hyponatremia, ETOH withdrawal, and multiple falls   Pt s/p chest tube placement 6/11. Per RN pt eating some; ate a whole serving of ice cream today. It is recommended that pt discharge to SNF. Per family, pt is having difficulty swallowing; plan for possible SLP evaluation when pt more alert. RD will order Ensure and Magic Cups. No new weight since admit.   Medications reviewed and include: Lovenox, folic acid, solu-medrol, zosyn, fentanyl   Labs reviewed: Na 134(L), Cl 100(L), Ca 8.6(L), Mg 1.8 wnl  Diet Order:  Diet full liquid Room service appropriate? Yes with Assist; Fluid consistency: Thin  Skin:  Reviewed, no issues  Last BM:  6/10  Height:   Ht Readings from Last 1 Encounters:  04/14/17 6\' 1"  (1.854 m)    Weight:   Wt Readings from Last 1 Encounters:  04/14/17 263 lb 0.1 oz (119.3 kg)    Ideal Body Weight:  83.63 kg  BMI:  Body mass index is 34.7 kg/m.  Estimated Nutritional Needs:   Kcal:  2350-2650kcal/day   Protein:  120-145 grams  Fluid:  701ml/kcal/day   EDUCATION NEEDS:   Education needs no appropriate at this time  Betsey Holidayasey Honi Name MS, RD, LDN Pager #- [phone]

## 2017-04-19 ENCOUNTER — Inpatient Hospital Stay: Payer: Medicare Other

## 2017-04-19 DIAGNOSIS — F10231 Alcohol dependence with withdrawal delirium: Secondary | ICD-10-CM

## 2017-04-19 DIAGNOSIS — F05 Delirium due to known physiological condition: Secondary | ICD-10-CM

## 2017-04-19 DIAGNOSIS — R410 Disorientation, unspecified: Secondary | ICD-10-CM

## 2017-04-19 DIAGNOSIS — F10931 Alcohol use, unspecified with withdrawal delirium: Secondary | ICD-10-CM

## 2017-04-19 DIAGNOSIS — J441 Chronic obstructive pulmonary disease with (acute) exacerbation: Secondary | ICD-10-CM

## 2017-04-19 LAB — COMPREHENSIVE METABOLIC PANEL
ALK PHOS: 46 U/L (ref 38–126)
ALT: 44 U/L (ref 17–63)
ANION GAP: 9 (ref 5–15)
AST: 21 U/L (ref 15–41)
Albumin: 3 g/dL — ABNORMAL LOW (ref 3.5–5.0)
BILIRUBIN TOTAL: 0.9 mg/dL (ref 0.3–1.2)
BUN: 18 mg/dL (ref 6–20)
CALCIUM: 8.7 mg/dL — AB (ref 8.9–10.3)
CO2: 28 mmol/L (ref 22–32)
CREATININE: 0.76 mg/dL (ref 0.61–1.24)
Chloride: 97 mmol/L — ABNORMAL LOW (ref 101–111)
GFR calc Af Amer: >60 mL/min (ref >60)
GFR calc non Af Amer: >60 mL/min (ref >60)
GLUCOSE: 181 mg/dL — AB (ref 65–99)
Potassium: 4.1 mmol/L (ref 3.5–5.1)
Sodium: 134 mmol/L — ABNORMAL LOW (ref 135–145)
TOTAL PROTEIN: 6.6 g/dL (ref 6.5–8.1)

## 2017-04-19 LAB — PROCALCITONIN

## 2017-04-19 LAB — GLUCOSE, CAPILLARY
GLUCOSE-CAPILLARY: 170 mg/dL — AB (ref 65–99)
Glucose-Capillary: 195 mg/dL — ABNORMAL HIGH (ref 65–99)
Glucose-Capillary: 167 mg/dL — ABNORMAL HIGH (ref 65–99)

## 2017-04-19 LAB — MAGNESIUM: Magnesium: 2.2 mg/dL (ref 1.7–2.4)

## 2017-04-19 LAB — PHOSPHORUS: Phosphorus: 3.5 mg/dL (ref 2.5–4.6)

## 2017-04-19 LAB — CBC
HEMATOCRIT: 33.9 % — AB (ref 40.0–52.0)
HEMOGLOBIN: 11.9 g/dL — AB (ref 13.0–18.0)
MCH: 34.4 pg — ABNORMAL HIGH (ref 26.0–34.0)
MCHC: 35.3 g/dL (ref 32.0–36.0)
MCV: 97.4 fL (ref 80.0–100.0)
Platelets: 209 10*3/uL (ref 150–440)
RBC: 3.48 MIL/uL — AB (ref 4.40–5.90)
RDW: 15.5 % — ABNORMAL HIGH (ref 11.5–14.5)
WBC: 8.8 10*3/uL (ref 3.8–10.6)

## 2017-04-19 MED ORDER — FLUTICASONE PROPIONATE 50 MCG/ACT NA SUSP
2.0000 | Freq: Every day | NASAL | Status: DC
  Administered 2017-04-19 – 2017-04-21 (×3): 2 via NASAL
  Filled 2017-04-19 (×2): qty 16

## 2017-04-19 MED ORDER — INSULIN ASPART 100 UNIT/ML ~~LOC~~ SOLN: 0.0000 [IU] | Freq: Every day | SUBCUTANEOUS | Status: DC

## 2017-04-19 MED ORDER — INSULIN ASPART 100 UNIT/ML ~~LOC~~ SOLN
0.0000 [IU] | Freq: Three times a day (TID) | SUBCUTANEOUS | Status: DC
  Administered 2017-04-19 – 2017-04-20 (×4): 3 [IU] via SUBCUTANEOUS
  Administered 2017-04-20: 5 [IU] via SUBCUTANEOUS
  Administered 2017-04-21 – 2017-04-22 (×4): 3 [IU] via SUBCUTANEOUS
  Filled 2017-04-19 (×2): qty 1
  Filled 2017-04-19: qty 3
  Filled 2017-04-19 (×2): qty 1
  Filled 2017-04-19: qty 5
  Filled 2017-04-19: qty 1
  Filled 2017-04-19 (×2): qty 3
  Filled 2017-04-19: qty 5

## 2017-04-19 MED ORDER — DOXYCYCLINE HYCLATE 100 MG PO TABS
100.0000 mg | ORAL_TABLET | Freq: Two times a day (BID) | ORAL | Status: DC
  Administered 2017-04-19 – 2017-04-21 (×6): 100 mg via ORAL
  Filled 2017-04-19 (×8): qty 1

## 2017-04-19 MED ORDER — VITAMIN B-1 100 MG PO TABS
100.0000 mg | ORAL_TABLET | Freq: Every day | ORAL | Status: DC
  Administered 2017-04-20 – 2017-04-21 (×2): 100 mg via ORAL
  Filled 2017-04-19 (×4): qty 1

## 2017-04-19 MED ORDER — FOLIC ACID 1 MG PO TABS
1.0000 mg | ORAL_TABLET | Freq: Every day | ORAL | Status: DC
  Administered 2017-04-20 – 2017-04-21 (×2): 1 mg via ORAL
  Filled 2017-04-19 (×4): qty 1

## 2017-04-19 NOTE — Consult Note (Signed)
[facility] Face-to-Face Psychiatry Consult   Reason for Consult:  Consult follow-up with this 66 year old man with a history of alcohol abuse who is in the hospital recovering from probably delirium tremens as well as hyponatremia related to alcohol abuse Referring Physician:  Posey Pronto Patient Identification: Arthur Conner MRN:  [id number] Principal Diagnosis: Subacute delirium Diagnosis:   Patient Active Problem List   Diagnosis Date Noted  . Delirium tremens ([place]) [F10.231] 04/19/2017  . Subacute delirium [F05] 04/19/2017  . COPD exacerbation ([place]) [J44.1] 04/18/2017  . Hyponatremia [E87.1] 04/14/2017  . Asthmatic bronchitis [J45.909] 10/31/2015  . Hypothyroidism [E03.9] 08/12/2015  . Asthma exacerbation [J45.901] 07/29/2015  . Essential hypertension [I10] 04/24/2015    Total Time spent with patient: 30 minutes  Subjective:   Arthur Conner is a 66 y.o. male patient admitted with patient was not able to communicate.  HPI:  Patient seen and I attempted to communicate with him a little bit. Chart reviewed. Spoke to the patient's sister. They have asked me to call his son and I will try to follow-up with that as well. 66 year old man brought into the hospital with altered mental status found to be extremely hyponatremic. Has been delirious with altered mental status and uncommunicative. Presumably from profound hyponatremia as well as from delirium tremens. Patient was in the intensive care unit on [facility] for a period of time but now has moved out of the intensive care unit. On interview today I found him to be eyes open alert and interactive in that he was able to nod his head and follow a couple of one-step commands but was not able to speak or express himself in any more complex way.  Patient evidently has been living with his adult son for a long time but the patient's drinking and failure to care for himself have been getting worse and worse with time. Family seem to be split in how  close they are to the patient.  Medical history: History of high blood pressure and asthma and acute hyponatremia which is gradually improving  Substance abuse history: Long history of heavy alcohol abuse  Past Psychiatric History: Unknown past psychiatric history other than long-standing alcohol abuse. Not clear whether he's ever had DTs in the past.  Risk to Self: Is patient at risk for suicide?: No Risk to Others:   Prior Inpatient Therapy:   Prior Outpatient Therapy:    Past Medical History:  Past Medical History:  Diagnosis Date  . Anxiety   . Asthma   . Depression   . Gout   . Hypertension     Past Surgical History:  Procedure Laterality Date  . APPENDECTOMY    . TONSILLECTOMY     Family History:  Family History  Problem Relation Age of Onset  . Lung cancer Father   . Aneurysm Mother    Family Psychiatric  History: Unknown Social History:  History  Alcohol Use  . 0.0 oz/week    Comment: "I'm not even sure how much I drink"     History  Drug Use No    Social History   Social History  . Marital status: Married    Spouse name: N/A  . Number of children: 2  . Years of education: N/A   Occupational History  . OWNER Self Employed   Social History Main Topics  . Smoking status: Current Every Day Smoker    Packs/day: 0.50    Types: Cigarettes    Last attempt to quit: 08/11/1990  . Smokeless  tobacco: Never Used  . Alcohol use 0.0 oz/week     Comment: "I'm not even sure how much I drink"  . Drug use: No  . Sexual activity: Not Asked   Other Topics Concern  . None   Social History Narrative  . None   Additional Social History:    Allergies:   Allergies  Allergen Reactions  . Toprol Xl [Metoprolol Tartrate] Other (See Comments)    Bradycardia    Labs:  Results for orders placed or performed during the hospital encounter of 04/14/17 (from the past 48 hour(s))  CBC     Status: Abnormal   Collection Time: 04/18/17  4:05 AM  Result Value Ref  Range   WBC 6.9 3.8 - 10.6 K/uL   RBC 3.41 (L) 4.40 - 5.90 MIL/uL   Hemoglobin 11.8 (L) 13.0 - 18.0 g/dL   HCT 33.2 (L) 40.0 - 52.0 %   MCV 97.2 80.0 - 100.0 fL   MCH 34.5 (H) 26.0 - 34.0 pg   MCHC 35.5 32.0 - 36.0 g/dL   RDW 15.4 (H) 11.5 - 14.5 %   Platelets 170 150 - 440 K/uL  Basic metabolic panel     Status: Abnormal   Collection Time: 04/18/17  4:05 AM  Result Value Ref Range   Sodium 134 (L) 135 - 145 mmol/L   Potassium 4.2 3.5 - 5.1 mmol/L   Chloride 100 (L) 101 - 111 mmol/L   CO2 25 22 - 32 mmol/L   Glucose, Bld 182 (H) 65 - 99 mg/dL   BUN 12 6 - 20 mg/dL   Creatinine, Ser 0.65 0.61 - 1.24 mg/dL   Calcium 8.6 (L) 8.9 - 10.3 mg/dL   GFR calc non Af Amer >60 >60 mL/min   GFR calc Af Amer >60 >60 mL/min    Comment: (NOTE) The eGFR has been calculated using the CKD EPI equation. This calculation has not been validated in all clinical situations. eGFR's persistently <60 mL/min signify possible Chronic Kidney Disease.    Anion gap 9 5 - 15  Magnesium     Status: None   Collection Time: 04/18/17  4:05 AM  Result Value Ref Range   Magnesium 1.8 1.7 - 2.4 mg/dL  Blood gas, arterial     Status: Abnormal   Collection Time: 04/18/17 10:30 AM  Result Value Ref Range   FIO2 0.52    Delivery systems HI FLOW NASAL CANNULA    pH, Arterial 7.43 7.350 - 7.450   pCO2 arterial 45 32.0 - 48.0 mmHg   pO2, Arterial 61 (L) 83.0 - 108.0 mmHg   Bicarbonate 29.9 (H) 20.0 - 28.0 mmol/L   Acid-Base Excess 4.8 (H) 0.0 - 2.0 mmol/L   O2 Saturation 91.7 %   Patient temperature 37.0    Collection site LEFT RADIAL    Sample type ARTERIAL DRAW    Allens test (pass/fail) PASS PASS  Procalcitonin     Status: None   Collection Time: 04/19/17  5:24 AM  Result Value Ref Range   Procalcitonin <0.10 ng/mL    Comment:        Interpretation: PCT (Procalcitonin) <= 0.5 ng/mL: Systemic infection (sepsis) is not likely. Local bacterial infection is possible. (NOTE)         ICU PCT Algorithm                Non ICU PCT Algorithm    ----------------------------     ------------------------------         PCT <  0.25 ng/mL                 PCT < 0.1 ng/mL     Stopping of antibiotics            Stopping of antibiotics       strongly encouraged.               strongly encouraged.    ----------------------------     ------------------------------       PCT level decrease by               PCT < 0.25 ng/mL       >= 80% from peak PCT       OR PCT 0.25 - 0.5 ng/mL          Stopping of antibiotics                                             encouraged.     Stopping of antibiotics           encouraged.    ----------------------------     ------------------------------       PCT level decrease by              PCT >= 0.25 ng/mL       < 80% from peak PCT        AND PCT >= 0.5 ng/mL            Continuin g antibiotics                                              encouraged.       Continuing antibiotics            encouraged.    ----------------------------     ------------------------------     PCT level increase compared          PCT > 0.5 ng/mL         with peak PCT AND          PCT >= 0.5 ng/mL             Escalation of antibiotics                                          strongly encouraged.      Escalation of antibiotics        strongly encouraged.   CBC     Status: Abnormal   Collection Time: 04/19/17  5:24 AM  Result Value Ref Range   WBC 8.8 3.8 - 10.6 K/uL   RBC 3.48 (L) 4.40 - 5.90 MIL/uL   Hemoglobin 11.9 (L) 13.0 - 18.0 g/dL   HCT 33.9 (L) 40.0 - 52.0 %   MCV 97.4 80.0 - 100.0 fL   MCH 34.4 (H) 26.0 - 34.0 pg   MCHC 35.3 32.0 - 36.0 g/dL   RDW 15.5 (H) 11.5 - 14.5 %   Platelets 209 150 - 440 K/uL  Comprehensive metabolic panel     Status: Abnormal   Collection Time: 04/19/17  5:24 AM  Result Value Ref Range   Sodium 134 (L) 135 -  145 mmol/L   Potassium 4.1 3.5 - 5.1 mmol/L   Chloride 97 (L) 101 - 111 mmol/L   CO2 28 22 - 32 mmol/L   Glucose, Bld 181 (H) 65 - 99 mg/dL    BUN 18 6 - 20 mg/dL   Creatinine, Ser 0.76 0.61 - 1.24 mg/dL   Calcium 8.7 (L) 8.9 - 10.3 mg/dL   Total Protein 6.6 6.5 - 8.1 g/dL   Albumin 3.0 (L) 3.5 - 5.0 g/dL   AST 21 15 - 41 U/L   ALT 44 17 - 63 U/L   Alkaline Phosphatase 46 38 - 126 U/L   Total Bilirubin 0.9 0.3 - 1.2 mg/dL   GFR calc non Af Amer >60 >60 mL/min   GFR calc Af Amer >60 >60 mL/min    Comment: (NOTE) The eGFR has been calculated using the CKD EPI equation. This calculation has not been validated in all clinical situations. eGFR's persistently <60 mL/min signify possible Chronic Kidney Disease.    Anion gap 9 5 - 15  Magnesium     Status: None   Collection Time: 04/19/17  5:24 AM  Result Value Ref Range   Magnesium 2.2 1.7 - 2.4 mg/dL  Phosphorus     Status: None   Collection Time: 04/19/17  5:24 AM  Result Value Ref Range   Phosphorus 3.5 2.5 - 4.6 mg/dL  Glucose, capillary     Status: Abnormal   Collection Time: 04/19/17  1:27 PM  Result Value Ref Range   Glucose-Capillary 170 (H) 65 - 99 mg/dL   Comment 1 Notify RN     Current Facility-Administered Medications  Medication Dose Route Frequency Provider Last Rate Last Dose  . acetaminophen (TYLENOL) tablet 650 mg  650 mg Oral Q6H PRN Gouru, Aruna, MD       Or  . acetaminophen (TYLENOL) suppository 650 mg  650 mg Rectal Q6H PRN Gouru, Aruna, MD      . albuterol (PROVENTIL) (2.5 MG/3ML) 0.083% nebulizer solution 2.5 mg  2.5 mg Nebulization Q3H PRN Wilhelmina Mcardle, MD   2.5 mg at 04/19/17 1057  . budesonide (PULMICORT) nebulizer solution 0.25 mg  0.25 mg Nebulization Q6H Wilhelmina Mcardle, MD   0.25 mg at 04/19/17 1340  . doxycycline (VIBRA-TABS) tablet 100 mg  100 mg Oral Q12H Laverle Hobby, MD   100 mg at 04/19/17 1038  . enoxaparin (LOVENOX) injection 40 mg  40 mg Subcutaneous Q24H Gouru, Aruna, MD   40 mg at 04/18/17 2118  . feeding supplement (ENSURE ENLIVE) (ENSURE ENLIVE) liquid 237 mL  237 mL Oral BID BM Fritzi Mandes, MD   237 mL at 04/19/17  1452  . fentaNYL (SUBLIMAZE) injection 25-100 mcg  25-100 mcg Intravenous Q2H PRN Laverle Hobby, MD   50 mcg at 04/18/17 0726  . fluticasone (FLONASE) 50 MCG/ACT nasal spray 2 spray  2 spray Each Nare Daily Awilda Bill, NP   2 spray at 04/19/17 0855  . [START ON 0/98/1191] folic acid (FOLVITE) tablet 1 mg  1 mg Oral Daily Laverle Hobby, MD      . insulin aspart (novoLOG) injection 0-15 Units  0-15 Units Subcutaneous TID WC Laverle Hobby, MD   3 Units at 04/19/17 1339  . insulin aspart (novoLOG) injection 0-5 Units  0-5 Units Subcutaneous QHS Laverle Hobby, MD      . ipratropium-albuterol (DUONEB) 0.5-2.5 (3) MG/3ML nebulizer solution 3 mL  3 mL Nebulization Q6H Wilhelmina Mcardle, MD   3 mL at 04/19/17  1340  . lip balm (BLISTEX) ointment   Topical PRN Laverle Hobby, MD      . LORazepam (ATIVAN) injection 0.5-1 mg  0.5-1 mg Intravenous Q4H PRN Wilhelmina Mcardle, MD   0.5 mg at 04/19/17 1105  . MEDLINE mouth rinse  15 mL Mouth Rinse BID Gladstone Lighter, MD   15 mL at 04/19/17 1000  . methylPREDNISolone sodium succinate (SOLU-MEDROL) 40 mg/mL injection 40 mg  40 mg Intravenous Q12H Awilda Bill, NP   40 mg at 04/19/17 1109  . multivitamin liquid 15 mL  15 mL Oral Daily Fritzi Mandes, MD   15 mL at 04/19/17 0856  . ondansetron (ZOFRAN) injection 4 mg  4 mg Intravenous Q6H PRN Gouru, Aruna, MD   4 mg at 04/14/17 1930  . [START ON 04/20/2017] thiamine (VITAMIN B-1) tablet 100 mg  100 mg Oral Daily Laverle Hobby, MD        Musculoskeletal: Strength & Muscle Tone: decreased Gait & Station: unable to stand Patient leans: N/A  Psychiatric Specialty Exam: Physical Exam  Nursing note and vitals reviewed. Constitutional: He appears well-developed and well-nourished.  HENT:  Head: Normocephalic and atraumatic.  Eyes: Conjunctivae are normal. Pupils are equal, round, and reactive to light.  Neck: Normal range of motion.  Cardiovascular: Normal heart  sounds.   GI: Soft.  Musculoskeletal: Normal range of motion.  Neurological: He is alert.  Skin: Skin is warm and dry.  Psychiatric: His affect is blunt. He is noncommunicative.    Review of Systems  Unable to perform ROS: Patient nonverbal    Blood pressure 120/74, pulse 90, temperature 97.5 F (36.4 C), temperature source Oral, resp. rate 20, height 6' 1"  (1.854 m), weight 119.3 kg (263 lb 0.1 oz), SpO2 90 %.Body mass index is 34.7 kg/m.  General Appearance: Disheveled  Eye Contact:  Fair  Speech:  Negative  Volume:  Decreased  Mood:  Negative  Affect:  Blunt  Thought Process:  NA  Orientation:  Negative  Thought Content:  Negative  Suicidal Thoughts:  No  Homicidal Thoughts:  No  Memory:  Negative  Judgement:  Negative  Insight:  Negative  Psychomotor Activity:  Negative  Concentration:  Concentration: Negative  Recall:  NA  Fund of Knowledge:  Negative  Language:  Negative  Akathisia:  Negative  Handed:  Right  AIMS (if indicated):     Assets:  Housing Social Support  ADL's:  Impaired  Cognition:  Impaired,  Moderate  Sleep:        Treatment Plan Summary: Daily contact with patient to assess and evaluate symptoms and progress in treatment, Medication management and Plan 66 year old man who has been in the hospital for a few days with delirium. Gradually improving. He is still not verbally communicative to my exam today. Spoke with the sister who expresses the concern on the part of the family and that the patient's ability to care for himself at home has been getting worse and worse. Sounds like they would very much like to have some kind of intervention for a better living situation. I told her at this point we need to just support him and see how much mental function he is going to retain. No changed any medication right now. I will follow-up as needed.  Disposition: Patient does not meet criteria for psychiatric inpatient admission. Supportive therapy provided  about ongoing stressors.  Alethia Berthold, MD 04/19/2017 4:47 PM
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
## 2017-04-19 NOTE — Progress Notes (Signed)
Inpatient Diabetes Program Recommendations  AACE/ADA: New Consensus Statement on Inpatient Glycemic Control (2015)  Target Ranges:  Prepandial:   less than 140 mg/dL      Peak postprandial:   less than 180 mg/dL (1-2 hours)      Critically ill patients:  140 - 180 mg/dL   Results for Arthur Conner, [name] (MRN [id number]) as of 04/19/2017 09:36  Ref. Range 04/15/2017 17:56 04/16/2017 04:25 04/17/2017 04:21 04/18/2017 04:05 04/19/2017 05:24  Glucose Latest Ref Range: 65 - 99 mg/dL 409132 (H) 811105 (H) 914129 (H) 182 (H) 181 (H)   Review of Glycemic Control  Diabetes history: No Outpatient Diabetes medications: NA Current orders for Inpatient glycemic control: None  Inpatient Diabetes Program Recommendations: Correction (SSI): While inpatient and ordered steroids, please consider ordering CBGs with Novolog correction scale ACHS.  Thanks, Orlando PennerMarie Vanderbilt Ranieri, RN, MSN, CDE Diabetes Coordinator Inpatient Diabetes Program [phone] (Team Pager from 8am to 5pm)

## 2017-04-19 NOTE — Progress Notes (Signed)
MEDICATION RELATED CONSULT NOTE    Pharmacy Consult for electrolyte monitoring   Pharmacy consulted for electrolyte monitoring for 66 yo male admitted with hyponatremia and alcohol withdrawal. Patient previously receiving potassium in MIVF.   K 4.1, Phos 3.5, Mag 2.2  Plan: Electrolytes WNL. Will recheck all electrolytes in two days with am labs.    Allergies  Allergen Reactions  . Toprol Xl [Metoprolol Tartrate] Other (See Comments)    Bradycardia    Patient Measurements: Height: 6\' 1"  (185.4 cm) Weight: 263 lb 0.1 oz (119.3 kg) IBW/kg (Calculated) : 79.9  Vital Signs: Temp: 97.5 F (36.4 C) (06/12 1312) Temp Source: Oral (06/12 1312) BP: 120/74 (06/12 1312) Pulse Rate: 90 (06/12 1312) Intake/Output from previous day: 06/11 0701 - 06/12 0700 In: 580 [P.O.:480; IV Piggyback:100] Out: -  Intake/Output from this shift: Total I/O In: 345.2 [P.O.:240; I.V.:5.2; IV Piggyback:100] Out: -   Labs:  Recent Labs  04/17/17 0421 04/18/17 0405 04/19/17 0524  WBC 8.6 6.9 8.8  HGB 12.5* 11.8* 11.9*  HCT 34.6* 33.2* 33.9*  PLT 152 170 209  CREATININE 0.82 0.65 0.76  MG  --  1.8 2.2  PHOS  --   --  3.5  ALBUMIN  --   --  3.0*  PROT  --   --  6.6  AST  --   --  21  ALT  --   --  44  ALKPHOS  --   --  46  BILITOT  --   --  0.9   Estimated Creatinine Clearance: 124.6 mL/min (by C-G formula based on SCr of 0.76 mg/dL).   Pharmacy will continue to monitor and adjust per consult.   Marty HeckWang, [place] Harts L 04/19/2017,1:13 PM

## 2017-04-19 NOTE — Progress Notes (Signed)
[employer] [facility] - [place] at [facility]   PATIENT NAME: Arthur Conner    MR#:  [id number]  DATE OF BIRTH:  08/05/1951  SUBJECTIVE:   - Sodium improved up to128, mentation is much improved. Has been off of Precedex drip for more than 24 hours. - follows some simple commands. However appears tachypneic and hypoxic  REVIEW OF SYSTEMS:  Review of Systems  Unable to perform ROS: Mental status change    DRUG ALLERGIES:   Allergies  Allergen Reactions  . Toprol Xl [Metoprolol Tartrate] Other (See Comments)    Bradycardia    VITALS:  Blood pressure 120/74, pulse 90, temperature 97.5 F (36.4 C), temperature source Oral, resp. rate 20, height 6\' 1"  (1.854 m), weight 119.3 kg (263 lb 0.1 oz), SpO2 90 %.  PHYSICAL EXAMINATION:  Physical Exam  GENERAL:  66 y.o.-year-old patient lying in the bed with no acute distress. Restless at times. EYES: Pupils equal, round, reactive to light and accommodation. No scleral icterus. Extraocular muscles intact.  HEENT: Head atraumatic, normocephalic. Oropharynx and nasopharynx clear.  NECK:  Supple, no jugular venous distention. No thyroid enlargement, no tenderness.  LUNGS: Normal breath sounds bilaterally, no wheezing, rales,rhonchi or crepitation. No use of accessory muscles of respiration. Decreased bibasilar breath sounds CT placement right side CARDIOVASCULAR: S1, S2 normal. No murmurs, rubs, or gallops.  ABDOMEN: Soft, nontender, nondistended. Bowel sounds present. No organomegaly or mass.  EXTREMITIES: No pedal edema, cyanosis, or clubbing.  NEUROLOGIC:  Cranial nerves seem to be intact, able to move all 4 extremities in bed. Sensation is intact. Following simple commands. PSYCHIATRIC: The patient is slightly more alert today, oriented x 1-2, following simple commands but not oriented.  SKIN: No obvious rash, lesion, or ulcer. Left leg is bruised   LABORATORY PANEL:   CBC  Recent Labs Lab 04/19/17 0524  WBC 8.8    HGB 11.9*  HCT 33.9*  PLT 209   ------------------------------------------------------------------------------------------------------------------  Chemistries   Recent Labs Lab 04/19/17 0524  NA 134*  K 4.1  CL 97*  CO2 28  GLUCOSE 181*  BUN 18  CREATININE 0.76  CALCIUM 8.7*  MG 2.2  AST 21  ALT 44  ALKPHOS 46  BILITOT 0.9   ------------------------------------------------------------------------------------------------------------------  Cardiac Enzymes No results for input(s): TROPONINI in the last 168 hours. ------------------------------------------------------------------------------------------------------------------  RADIOLOGY:  Dg Chest 1 View  Result Date: 04/19/2017 CLINICAL DATA:  66 year old male with dyspnea. Subsequent encounter. EXAM: CHEST 1 VIEW COMPARISON:  04/18/2017. FINDINGS: Right-sided chest tube remains in place. Configuration appears different than on prior exam and therefore may have shifted position. Slight decrease in size of right apical pneumothorax now approximately 5-10%. Wedged shaped consolidation right mid lung stable. Pulmonary vascular congestion/ pulmonary edema. Cardiomegaly. Calcified slightly tortuous aorta. Remote left clavicle fracture. IMPRESSION: Change in configuration of right chest tube. Slight decrease in size of right apical pneumothorax now approximately 5-10%. Consolidation right mid lung stable. Pulmonary vascular congestion/ mild pulmonary edema stable. Cardiomegaly. Aortic atherosclerosis. Electronically Signed   By: Lacy Duverney M.D.   On: 04/19/2017 06:57   Dg Chest 1 View  Result Date: 04/18/2017 CLINICAL DATA:  66 year old male with alcohol withdrawal. Multiple fall. EXAM: CHEST 1 VIEW COMPARISON:  04/18/2017. FINDINGS: Interval placement of right-sided chest tube. There has been decrease in volume of the right-sided pneumothorax which measures approximately 1.2 cm in thickness over the apex. The heart size appears  normal. Bilateral airspace opacities are unchanged from previous exam. Chronic appearing right posterior rib fracture  deformity noted. IMPRESSION: 1. Decrease in volume of right-sided pneumothorax status post chest tube placement. 2. No change in bilateral airspace opacities. Electronically Signed   By: Signa Kellaylor  Stroud M.D.   On: 04/18/2017 08:43   Dg Chest Port 1 View  Result Date: 04/18/2017 CLINICAL DATA:  Follow-up examination for respiratory failure. EXAM: PORTABLE CHEST 1 VIEW COMPARISON:  Prior radiograph from 04/17/2017. FINDINGS: Stable cardiomegaly.  Mediastinal silhouette normal. Lungs hypoinflated. Perihilar vascular and interstitial congestion. , progressed from previous. Persistent right upper lobe opacity, slightly worsened. New left perihilar opacity, which may reflect edema or additional infiltrate. There is a new mild to moderate size right pneumothorax. No mediastinal shift or other complication. Suspected small pleural effusions. No acute osseus abnormality. IMPRESSION: 1. New small moderate right-sided pneumothorax. No mediastinal shift or other complication. 2. Persistent right upper lobe airspace opacity, concerning for pneumonia. 3. Interval worsening in underlying vascular and interstitial congestion. Probable small bilateral pleural effusions. Critical Value/emergent results were called by telephone at the time of interpretation on 04/18/2017 at 6:27 am to Dr. Darcel BayleyFalbo , who verbally acknowledged these results. Electronically Signed   By: Rise MuBenjamin  McClintock M.D.   On: 04/18/2017 06:28    EKG:   Orders placed or performed during the hospital encounter of 04/14/17  . ED EKG  . ED EKG  . EKG 12-Lead  . EKG 12-Lead    ASSESSMENT AND PLAN:   66 year old male with past medical history significant for depression and anxiety, hypertension, alcohol abuse presents to the hospital secondary to altered mental status and falls and noted to have hyponatremia.  #1 hyponatremia-likely  acute, beer potomania and also poor oral intake. -slowly improving sodium, now at 134, was on NS @ 50cc/hr. - Appreciate nephrology consult. -came in with critically low sodium of 105---113--118--128--134 DC'd IV fluids-  #2 altered mental status-metabolic encephalopathy secondary to hyponatremia and DTs. -Continue to monitor. No focal deficits. Improving now, more alert and following some simple commands today -CT of the head with no acute findings.  #3 alcohol abuse with possible alcoholic liver disease-will need counseling. -Some agitation likely from withdrawals. Off Precedex drip, ativan PRN. -Psych consult pending. Follow recommendations when given.  #4 fall-secondary to intoxication. X-rays revealed either subacute or old right tibial plateau fractures. - Orthopedics consultappreciated. Recommends physical therapy - MRI showing it to be remote fracture -Physical therapy once patient is more alert.  #5 Dysphagia- per son, patient had trouble swallowing and nausea and vomiting prior to admission. Followed by speech   #6 tachypnea and hypoxia due to right upper lobe pneumonia and upper lobe pneumothorax secondary to trauma with fall - CXR with RUL infiltrate- possible aspiration pneumonia - encourage to add zosyn/unasyn for coverage -CXR showed right UL PTX now has CT placed on 04/18/17  #7 DVT prophylaxis-Lovenox  Patient being transferred to medical floor. Physical therapy recommends rehabilitation. Social worker for discharge planning.  All the records are reviewed and case discussed with Care Management/Social Workerr. Management plans discussed with the patient, family and they are in agreement.  CODE STATUS: Full Code  TOTAL TIME TAKING CARE OF THIS PATIENT: 25 minutes.     Deana Krock M.D on 04/19/2017 at 2:41 PM  Between 7am to 6pm - Pager - [phone]  After 6pm go to [email or web address] - Social research officer, governmentpassword [email or web address] [email or web address]  [facility] [place] [facility]  Office   [phone]  CC: Primary care physician; Steele Sizerrissman, Mark A, MD

## 2017-04-19 NOTE — Evaluation (Signed)
Physical Therapy Evaluation Patient Details Name: Arthur Conner MRN: [id number] DOB: 01/02/1951 Today's Date: 04/19/2017   History of Present Illness  66 year old male with past medical history significant for depression and anxiety, hypertension, alcohol abuse presents to the hospital secondary to altered mental status and falls and noted to have hyponatremia.  Clinical Impression  Pt was lethargic and inconsistent with verbalization t/o the PT exam, but ultimately was more awake and interactive than he has been since admit and did better than expected. He was able to participate with some supine bed exercises (some with even light resistance).  He was also able to tolerate ~8 minutes of sitting at EOB with varying degrees of assistance t/o the effort (at times CGA only, at times min/mod assist).       Follow Up Recommendations SNF (pt indicates he does not want to go, son recognizes he must)    Equipment Recommendations       Recommendations for Other Services       Precautions / Restrictions Precautions Precautions: Fall Restrictions Weight Bearing Restrictions: No      Mobility  Bed Mobility Overal bed mobility: Needs Assistance Bed Mobility: Supine to Sit;Sit to Supine     Supine to sit: Min assist;Mod assist Sit to supine: Min assist;Mod assist   General bed mobility comments: Pt showed good effort in getting up to EOB and was actually able to maintain sitting balance w/o assist for at times up to 1 minute before needing assist to keep from leaning over.  Pt was highly limited, but did better than expected given his last few days.  O2 sats remined ~90% during the effort on 35% HFNC.  Pt did not describe excessive fatigue.   Transfers                 General transfer comment: Unsafe to attempt today  Ambulation/Gait                Stairs            Wheelchair Mobility    Modified Rankin (Stroke Patients Only)       Balance Overall  balance assessment: Needs assistance Sitting-balance support: Bilateral upper extremity supported Sitting balance-Leahy Scale: Fair Sitting balance - Comments: Pt able to sit w/o direct assist a few bouts of ~1 minute, generally needed light min assist to keep from leaning to the L                                     Pertinent Vitals/Pain Pain Assessment:  (he does report sore LEs L<R, bruising from falls)    Home Living Family/patient expects to be discharged to:: Skilled nursing facility Living Arrangements: Children                    Prior Function Level of Independence: Needs assistance         Comments: Son reports pt has been falling more and more frequently, but that until the last month he was able to walk, care for himself, etc     Hand Dominance        Extremity/Trunk Assessment   Upper Extremity Assessment Upper Extremity Assessment: Generalized weakness (grossly unable to elevate >100deg b/l, grossly 3-/5 )    Lower Extremity Assessment Lower Extremity Assessment: Generalized weakness (some AROM, pain with movement)       Communication   Communication:  (lethargic,  able to open eyes and communicate inconsistently)  Cognition Arousal/Alertness: Lethargic Behavior During Therapy:  (intermittent awake/aware moments, difficult to keep on task) Overall Cognitive Status: Impaired/Different from baseline                                 General Comments: Pt at times able/willing to answer questions, etc and at time is very limited      General Comments      Exercises General Exercises - Lower Extremity Ankle Circles/Pumps: AROM;5 reps Heel Slides: AROM;10 reps Hip ABduction/ADduction: AROM;10 reps   Assessment/Plan    PT Assessment Patient needs continued PT services  PT Problem List Decreased strength;Decreased activity tolerance;Decreased balance;Decreased mobility;Decreased knowledge of use of DME;Decreased  safety awareness;Pain;Cardiopulmonary status limiting activity;Decreased cognition;Decreased range of motion       PT Treatment Interventions DME instruction;Stair training;Gait training;Therapeutic activities;Functional mobility training;Therapeutic exercise;Balance training;Patient/family education;Cognitive remediation    PT Goals (Current goals can be found in the Care Plan section)  Acute Rehab PT Goals Patient Stated Goal: get back to walking PT Goal Formulation: With patient/family Time For Goal Achievement: 2017/05/09 Potential to Achieve Goals: Fair    Frequency Min 2X/week   Barriers to discharge        Co-evaluation               AM-PAC PT "6 Clicks" Daily Activity  Outcome Measure Difficulty turning over in bed (including adjusting bedclothes, sheets and blankets)?: Total Difficulty moving from lying on back to sitting on the side of the bed? : Total Difficulty sitting down on and standing up from a chair with arms (e.g., wheelchair, bedside commode, etc,.)?: Total Help needed moving to and from a bed to chair (including a wheelchair)?: Total Help needed walking in hospital room?: Total Help needed climbing 3-5 steps with a railing? : Total 6 Click Score: 6    End of Session Equipment Utilized During Treatment: Oxygen (HFNC 35%) Activity Tolerance: Patient limited by lethargy;Patient limited by fatigue;Patient tolerated treatment well Patient left: with bed alarm set;with call bell/phone within reach;with family/visitor present Nurse Communication: Mobility status PT Visit Diagnosis: Muscle weakness (generalized) (M62.81);Difficulty in walking, not elsewhere classified (R26.2)    Time: 1610-9604 PT Time Calculation (min) (ACUTE ONLY): 34 min   Charges:   PT Evaluation $PT Eval Low Complexity: 1 Procedure PT Treatments $Therapeutic Exercise: 8-22 mins   PT G Codes:        Malachi Pro, DPT 04/19/2017, 11:33 AM

## 2017-04-20 ENCOUNTER — Inpatient Hospital Stay: Payer: Medicare Other

## 2017-04-20 DIAGNOSIS — F05 Delirium due to known physiological condition: Secondary | ICD-10-CM

## 2017-04-20 LAB — GLUCOSE, CAPILLARY
GLUCOSE-CAPILLARY: 190 mg/dL — AB (ref 65–99)
GLUCOSE-CAPILLARY: 171 mg/dL — AB (ref 65–99)
GLUCOSE-CAPILLARY: 161 mg/dL — AB (ref 65–99)
Glucose-Capillary: 207 mg/dL — ABNORMAL HIGH (ref 65–99)

## 2017-04-20 MED ORDER — OXYCODONE HCL 5 MG PO TABS
5.0000 mg | ORAL_TABLET | Freq: Four times a day (QID) | ORAL | Status: DC | PRN
  Administered 2017-04-20: 5 mg via ORAL
  Filled 2017-04-20: qty 1

## 2017-04-20 MED ORDER — LEVOTHYROXINE SODIUM 100 MCG PO TABS
100.0000 ug | ORAL_TABLET | Freq: Every day | ORAL | Status: DC
  Administered 2017-04-21 – 2017-04-22 (×2): 100 ug via ORAL
  Filled 2017-04-20: qty 2
  Filled 2017-04-20: qty 1

## 2017-04-20 MED ORDER — LORATADINE 10 MG PO TABS
10.0000 mg | ORAL_TABLET | Freq: Every day | ORAL | Status: DC
  Administered 2017-04-21: 10 mg via ORAL
  Filled 2017-04-20 (×3): qty 1

## 2017-04-20 MED ORDER — ALLOPURINOL 100 MG PO TABS
300.0000 mg | ORAL_TABLET | Freq: Every day | ORAL | Status: DC
  Administered 2017-04-21: 300 mg via ORAL
  Filled 2017-04-20 (×2): qty 3

## 2017-04-20 NOTE — Clinical Social Work Note (Signed)
Clinical Social Work Assessment  Patient Details  Name: Arthur Conner MRN: [id number] Date of Birth: [birth year]  Date of referral:  04/20/17               Reason for consult:  Facility Placement, Discharge Planning                Permission sought to share information with:  Case Manager, Magazine features editor, Family Supports Permission granted to share information::  Yes, Verbal Permission Granted  Name::        Agency::     Relationship::  Son at bedside [employer] along with patient's sister   Contact Information:     Housing/Transportation Living arrangements for the past 2 months:  Single Family Home Source of Information:  Patient, Medical Team, Case Manager, Adult Children Patient Interpreter Needed:  None Criminal Activity/Legal Involvement Pertinent to Current Situation/Hospitalization:  No - Comment as needed Significant Relationships:  Adult Children, Other Family Members Lives with:  Adult Children Do you feel safe going back to the place where you live?  Yes Need for family participation in patient care:  Yes (Comment)  Care giving concerns:  Patient lives at home with his son.  Son reports he does not work, but due to patient's current medical needs he would feel better if patient went into SNF, however will allow patient to determine what he wants and needs. Prior to admission patient was doing well per son, independent and not needing this level of care.  Recommendation is SNF per PT notes and this was communicated to family and patient. Patient reports he wants to go home and get some R&R, however family really pushing for SNF. Family aware if patient refuses they are prepared to take him back home and hire additional assistance for his care. Patient also aware of this.   Social Worker assessment / plan:  LCSW completed assessment of needs and spent time with family and patient answering all questions regarding discharge plan.  Patient is wanting  home and family is pushing for SNF, however family is allowing patient to make his own decision and prepared to take him home if needed.  Patient at this time does not give permission to fax out or complete SNF work up. He and family will discuss options and son will be in contact with LCSW regarding next steps for disposition.  If patient does go to SNF he will be short term.  FL2 completed in case patient is agreeable to SNF.  Plan: TBD  Employment status:  Retired Health and safety inspector:  Medicare PT Recommendations:  Skilled Nursing Facility Information / Referral to community resources:  Skilled Nursing Facility  Patient/Family's Response to care:  Family wanting SNF and patient wanting home. Indecisive at this time.  Patient/Family's Understanding of and Emotional Response to Diagnosis, Current Treatment, and Prognosis:  Family very much understands benefits of SNF. Patient is wanting home and to relax.  Lacks understanding and observed to be fearful of SNF as he does not want to make this his long term plan at discharge.  Emotional Assessment Appearance:  Appears stated age Attitude/Demeanor/Rapport:  Apprehensive Affect (typically observed):  Anxious, Apprehensive, Overwhelmed Orientation:  Oriented to Self, Oriented to Place, Oriented to  Time, Oriented to Situation Alcohol / Substance use:  Not Applicable Psych involvement (Current and /or in the community):  No (Comment)  Discharge Needs  Concerns to be addressed:  Patient refuses services, Care Coordination Readmission within the last 30 days:  No  Current discharge risk:  None Barriers to Discharge:  Continued Medical Work up   Cordella RegisterCoble, Kessler Solly N, LCSW 04/20/2017, 1:49 PM

## 2017-04-20 NOTE — [place] FL2 (Signed)
[place] MEDICAID FL2 LEVEL OF CARE SCREENING TOOL     IDENTIFICATION  Patient Name: Arthur Conner Birthdate: 1950/11/15 Sex: male Admission Date (Current Location): 04/14/2017  [place] and [place] Number:  Chiropodist and Address:  [facility], [address] [place], [place] [address]      Provider Number: [id number]  Attending Physician Name and Address:  Delfino Lovett, MD  Relative Name and Phone Number:       Current Level of Care: Hospital Recommended Level of Care: Skilled Nursing Facility Prior Approval Number:    Date Approved/Denied:   PASRR Number:   [id number]  Discharge Plan: SNF    Current Diagnoses: Patient Active Problem List   Diagnosis Date Noted  . Delirium tremens (HCC) 04/19/2017  . Subacute delirium 04/19/2017  . COPD exacerbation (HCC) 04/18/2017  . Hyponatremia 04/14/2017  . Asthmatic bronchitis 10/31/2015  . Hypothyroidism 08/12/2015  . Asthma exacerbation 07/29/2015  . Essential hypertension 04/24/2015    Orientation RESPIRATION BLADDER Height & Weight     Self, Time, Situation, Place  O2 (5L (working to wean prior to DC)) Incontinent Weight: 263 lb 0.1 oz (119.3 kg) Height:  6\' 1"  (185.4 cm)  BEHAVIORAL SYMPTOMS/MOOD NEUROLOGICAL BOWEL NUTRITION STATUS      Incontinent Diet (See DC summary)  AMBULATORY STATUS COMMUNICATION OF NEEDS Skin   Extensive Assist Verbally Normal                       Personal Care Assistance Level of Assistance  Bathing, Feeding, Dressing Bathing Assistance: Limited assistance Feeding assistance: Limited assistance Dressing Assistance: Limited assistance     Functional Limitations Info  Sight, Hearing, Speech Sight Info: Adequate Hearing Info: Adequate Speech Info: Adequate    SPECIAL CARE FACTORS FREQUENCY  PT (By licensed PT), OT (By licensed OT)     PT Frequency: 5x OT Frequency: 5x            Contractures Contractures Info: Not present     Additional Factors Info  Code Status, Allergies Code Status Info: Full Code Allergies Info: Toprol Xl Metoprolol Tartrate           Current Medications (04/20/2017):  This is the current hospital active medication list Current Facility-Administered Medications  Medication Dose Route Frequency Provider Last Rate Last Dose  . acetaminophen (TYLENOL) tablet 650 mg  650 mg Oral Q6H PRN Gouru, Aruna, MD       Or  . acetaminophen (TYLENOL) suppository 650 mg  650 mg Rectal Q6H PRN Gouru, Aruna, MD      . albuterol (PROVENTIL) (2.5 MG/3ML) 0.083% nebulizer solution 2.5 mg  2.5 mg Nebulization Q3H PRN Merwyn Katos, MD   2.5 mg at 04/19/17 1057  . [START ON 04/21/2017] allopurinol (ZYLOPRIM) tablet 300 mg  300 mg Oral Daily Sherryll Burger, Vipul, MD      . budesonide (PULMICORT) nebulizer solution 0.25 mg  0.25 mg Nebulization Q6H Merwyn Katos, MD   0.25 mg at 04/20/17 1400  . doxycycline (VIBRA-TABS) tablet 100 mg  100 mg Oral Q12H Shane Crutch, MD   100 mg at 04/20/17 0907  . enoxaparin (LOVENOX) injection 40 mg  40 mg Subcutaneous Q24H Gouru, Aruna, MD   40 mg at 04/19/17 2209  . feeding supplement (ENSURE ENLIVE) (ENSURE ENLIVE) liquid 237 mL  237 mL Oral BID BM Enedina Finner, MD   237 mL at 04/20/17 1350  . fentaNYL (SUBLIMAZE) injection 25-100 mcg  25-100 mcg Intravenous  Q2H PRN Shane Crutchamachandran, Pradeep, MD   50 mcg at 04/18/17 0726  . fluticasone (FLONASE) 50 MCG/ACT nasal spray 2 spray  2 spray Each Nare Daily Eugenie NorrieBlakeney, Dana G, NP   2 spray at 04/20/17 0907  . folic acid (FOLVITE) tablet 1 mg  1 mg Oral Daily Shane Crutchamachandran, Pradeep, MD   1 mg at 04/20/17 0907  . insulin aspart (novoLOG) injection 0-15 Units  0-15 Units Subcutaneous TID WC Shane Crutchamachandran, Pradeep, MD   5 Units at 04/20/17 1154  . insulin aspart (novoLOG) injection 0-5 Units  0-5 Units Subcutaneous QHS Shane Crutchamachandran, Pradeep, MD      . ipratropium-albuterol (DUONEB) 0.5-2.5 (3) MG/3ML nebulizer solution 3 mL  3 mL  Nebulization Q6H Merwyn KatosSimonds, David B, MD   3 mL at 04/20/17 1359  . [START ON 04/21/2017] levothyroxine (SYNTHROID, LEVOTHROID) tablet 100 mcg  100 mcg Oral QAC breakfast Sherryll BurgerShah, Vipul, MD      . lip balm (BLISTEX) ointment   Topical PRN Shane Crutchamachandran, Pradeep, MD      . Melene Muller[START ON 04/21/2017] loratadine (CLARITIN) tablet 10 mg  10 mg Oral Daily Sherryll BurgerShah, Vipul, MD      . LORazepam (ATIVAN) injection 0.5-1 mg  0.5-1 mg Intravenous Q4H PRN Merwyn KatosSimonds, David B, MD   0.5 mg at 04/19/17 1105  . MEDLINE mouth rinse  15 mL Mouth Rinse BID Enid BaasKalisetti, Radhika, MD   15 mL at 04/19/17 2209  . methylPREDNISolone sodium succinate (SOLU-MEDROL) 40 mg/mL injection 40 mg  40 mg Intravenous Q12H Eugenie NorrieBlakeney, Dana G, NP   40 mg at 04/20/17 1155  . multivitamin liquid 15 mL  15 mL Oral Daily Enedina FinnerPatel, Sona, MD   15 mL at 04/20/17 0907  . ondansetron (ZOFRAN) injection 4 mg  4 mg Intravenous Q6H PRN Gouru, Aruna, MD   4 mg at 04/14/17 1930  . oxyCODONE (Oxy IR/ROXICODONE) immediate release tablet 5 mg  5 mg Oral Q6H PRN Delfino LovettShah, Vipul, MD      . thiamine (VITAMIN B-1) tablet 100 mg  100 mg Oral Daily Shane Crutchamachandran, Pradeep, MD   100 mg at 04/20/17 [id number]     Discharge Medications: Please see discharge summary for a list of discharge medications.  Relevant Imaging Results:  Relevant Lab Results:   Additional Information SSN:  [id number]  Raye SorrowCoble, Mikhayla Phillis N, [place]

## 2017-04-20 NOTE — Consult Note (Signed)
[facility] Face-to-Face Psychiatry Consult   Reason for Consult:  Consult follow-up with this 66 year old man with a history of alcohol abuse who is in the hospital recovering from probably delirium tremens as well as hyponatremia related to alcohol abuse Referring Physician:  Posey Pronto Patient Identification: Arthur Conner MRN:  [id number] Principal Diagnosis: Subacute delirium Diagnosis:   Patient Active Problem List   Diagnosis Date Noted  . Delirium tremens (Ashley) [F10.231] 04/19/2017  . Subacute delirium [F05] 04/19/2017  . COPD exacerbation (Hermann) [J44.1] 04/18/2017  . Hyponatremia [E87.1] 04/14/2017  . Asthmatic bronchitis [J45.909] 10/31/2015  . Hypothyroidism [E03.9] 08/12/2015  . Asthma exacerbation [J45.901] 07/29/2015  . Essential hypertension [I10] 04/24/2015    Total Time spent with patient: 20 minutes  Subjective:   Arthur Conner is a 66 y.o. male patient admitted with patient was not able to communicate.  This is a follow-up note for Wednesday the 13th. Patient seen. Chart reviewed. He seems significantly better from a mental status standpoint today. He was able to converse although his speech is slow. He says he is feeling like he is coming back to normal. He was fully oriented to his situation and the date although he admits he has no memory of coming into the hospital. Denies having any visual hallucinations today. He is still a little bit shaky. He said however he was able to get out out of bed and did not feel dizzy.  HPI:  Patient seen and I attempted to communicate with him a little bit. Chart reviewed. Spoke to the patient's sister. They have asked me to call his son and I will try to follow-up with that as well. 66 year old man brought into the hospital with altered mental status found to be extremely hyponatremic. Has been delirious with altered mental status and uncommunicative. Presumably from profound hyponatremia as well as from delirium tremens. Patient was in the  intensive care unit on [facility] for a period of time but now has moved out of the intensive care unit. On interview today I found him to be eyes open alert and interactive in that he was able to nod his head and follow a couple of one-step commands but was not able to speak or express himself in any more complex way.  Patient evidently has been living with his adult son for a long time but the patient's drinking and failure to care for himself have been getting worse and worse with time. Family seem to be split in how close they are to the patient.  Medical history: History of high blood pressure and asthma and acute hyponatremia which is gradually improving  Substance abuse history: Long history of heavy alcohol abuse  Past Psychiatric History: Unknown past psychiatric history other than long-standing alcohol abuse. Not clear whether he's ever had DTs in the past.  Risk to Self: Is patient at risk for suicide?: No Risk to Others:   Prior Inpatient Therapy:   Prior Outpatient Therapy:    Past Medical History:  Past Medical History:  Diagnosis Date  . Anxiety   . Asthma   . Depression   . Gout   . Hypertension     Past Surgical History:  Procedure Laterality Date  . APPENDECTOMY    . TONSILLECTOMY     Family History:  Family History  Problem Relation Age of Onset  . Lung cancer Father   . Aneurysm Mother    Family Psychiatric  History: Unknown Social History:  History  Alcohol Use  . 0.0  oz/week    Comment: "I'm not even sure how much I drink"     History  Drug Use No    Social History   Social History  . Marital status: Married    Spouse name: N/A  . Number of children: 2  . Years of education: N/A   Occupational History  . OWNER Self Employed   Social History Main Topics  . Smoking status: Current Every Day Smoker    Packs/day: 0.50    Types: Cigarettes    Last attempt to quit: 08/11/1990  . Smokeless tobacco: Never Used  . Alcohol use 0.0 oz/week      Comment: "I'm not even sure how much I drink"  . Drug use: No  . Sexual activity: Not Asked   Other Topics Concern  . None   Social History Narrative  . None   Additional Social History:    Allergies:   Allergies  Allergen Reactions  . Toprol Xl [Metoprolol Tartrate] Other (See Comments)    Bradycardia    Labs:  Results for orders placed or performed during the hospital encounter of 04/14/17 (from the past 48 hour(s))  Procalcitonin     Status: None   Collection Time: 04/19/17  5:24 AM  Result Value Ref Range   Procalcitonin <0.10 ng/mL    Comment:        Interpretation: PCT (Procalcitonin) <= 0.5 ng/mL: Systemic infection (sepsis) is not likely. Local bacterial infection is possible. (NOTE)         ICU PCT Algorithm               Non ICU PCT Algorithm    ----------------------------     ------------------------------         PCT < 0.25 ng/mL                 PCT < 0.1 ng/mL     Stopping of antibiotics            Stopping of antibiotics       strongly encouraged.               strongly encouraged.    ----------------------------     ------------------------------       PCT level decrease by               PCT < 0.25 ng/mL       >= 80% from peak PCT       OR PCT 0.25 - 0.5 ng/mL          Stopping of antibiotics                                             encouraged.     Stopping of antibiotics           encouraged.    ----------------------------     ------------------------------       PCT level decrease by              PCT >= 0.25 ng/mL       < 80% from peak PCT        AND PCT >= 0.5 ng/mL            Continuin g antibiotics  encouraged.       Continuing antibiotics            encouraged.    ----------------------------     ------------------------------     PCT level increase compared          PCT > 0.5 ng/mL         with peak PCT AND          PCT >= 0.5 ng/mL             Escalation of antibiotics                                           strongly encouraged.      Escalation of antibiotics        strongly encouraged.   CBC     Status: Abnormal   Collection Time: 04/19/17  5:24 AM  Result Value Ref Range   WBC 8.8 3.8 - 10.6 K/uL   RBC 3.48 (L) 4.40 - 5.90 MIL/uL   Hemoglobin 11.9 (L) 13.0 - 18.0 g/dL   HCT 33.9 (L) 40.0 - 52.0 %   MCV 97.4 80.0 - 100.0 fL   MCH 34.4 (H) 26.0 - 34.0 pg   MCHC 35.3 32.0 - 36.0 g/dL   RDW 15.5 (H) 11.5 - 14.5 %   Platelets 209 150 - 440 K/uL  Comprehensive metabolic panel     Status: Abnormal   Collection Time: 04/19/17  5:24 AM  Result Value Ref Range   Sodium 134 (L) 135 - 145 mmol/L   Potassium 4.1 3.5 - 5.1 mmol/L   Chloride 97 (L) 101 - 111 mmol/L   CO2 28 22 - 32 mmol/L   Glucose, Bld 181 (H) 65 - 99 mg/dL   BUN 18 6 - 20 mg/dL   Creatinine, Ser 0.76 0.61 - 1.24 mg/dL   Calcium 8.7 (L) 8.9 - 10.3 mg/dL   Total Protein 6.6 6.5 - 8.1 g/dL   Albumin 3.0 (L) 3.5 - 5.0 g/dL   AST 21 15 - 41 U/L   ALT 44 17 - 63 U/L   Alkaline Phosphatase 46 38 - 126 U/L   Total Bilirubin 0.9 0.3 - 1.2 mg/dL   GFR calc non Af Amer >60 >60 mL/min   GFR calc Af Amer >60 >60 mL/min    Comment: (NOTE) The eGFR has been calculated using the CKD EPI equation. This calculation has not been validated in all clinical situations. eGFR's persistently <60 mL/min signify possible Chronic Kidney Disease.    Anion gap 9 5 - 15  Magnesium     Status: None   Collection Time: 04/19/17  5:24 AM  Result Value Ref Range   Magnesium 2.2 1.7 - 2.4 mg/dL  Phosphorus     Status: None   Collection Time: 04/19/17  5:24 AM  Result Value Ref Range   Phosphorus 3.5 2.5 - 4.6 mg/dL  Glucose, capillary     Status: Abnormal   Collection Time: 04/19/17  1:27 PM  Result Value Ref Range   Glucose-Capillary 170 (H) 65 - 99 mg/dL   Comment 1 Notify RN   Glucose, capillary     Status: Abnormal   Collection Time: 04/19/17  4:57 PM  Result Value Ref Range   Glucose-Capillary 195 (H) 65 - 99 mg/dL    Comment 1 Notify RN   Glucose, capillary  Status: Abnormal   Collection Time: 04/19/17  9:41 PM  Result Value Ref Range   Glucose-Capillary 167 (H) 65 - 99 mg/dL   Comment 1 Notify RN   Glucose, capillary     Status: Abnormal   Collection Time: 04/20/17  7:45 AM  Result Value Ref Range   Glucose-Capillary 190 (H) 65 - 99 mg/dL   Comment 1 Notify RN   Glucose, capillary     Status: Abnormal   Collection Time: 04/20/17 11:33 AM  Result Value Ref Range   Glucose-Capillary 207 (H) 65 - 99 mg/dL   Comment 1 Notify RN   Glucose, capillary     Status: Abnormal   Collection Time: 04/20/17  4:48 PM  Result Value Ref Range   Glucose-Capillary 171 (H) 65 - 99 mg/dL   Comment 1 Notify RN     Current Facility-Administered Medications  Medication Dose Route Frequency Provider Last Rate Last Dose  . acetaminophen (TYLENOL) tablet 650 mg  650 mg Oral Q6H PRN Gouru, Aruna, MD       Or  . acetaminophen (TYLENOL) suppository 650 mg  650 mg Rectal Q6H PRN Gouru, Aruna, MD      . albuterol (PROVENTIL) (2.5 MG/3ML) 0.083% nebulizer solution 2.5 mg  2.5 mg Nebulization Q3H PRN Wilhelmina Mcardle, MD   2.5 mg at 04/19/17 1057  . [START ON 04/21/2017] allopurinol (ZYLOPRIM) tablet 300 mg  300 mg Oral Daily Manuella Ghazi, Vipul, MD      . budesonide (PULMICORT) nebulizer solution 0.25 mg  0.25 mg Nebulization Q6H Wilhelmina Mcardle, MD   0.25 mg at 04/20/17 1400  . doxycycline (VIBRA-TABS) tablet 100 mg  100 mg Oral Q12H Laverle Hobby, MD   100 mg at 04/20/17 0907  . enoxaparin (LOVENOX) injection 40 mg  40 mg Subcutaneous Q24H Gouru, Aruna, MD   40 mg at 04/19/17 2209  . feeding supplement (ENSURE ENLIVE) (ENSURE ENLIVE) liquid 237 mL  237 mL Oral BID BM Fritzi Mandes, MD   237 mL at 04/20/17 1350  . fentaNYL (SUBLIMAZE) injection 25-100 mcg  25-100 mcg Intravenous Q2H PRN Laverle Hobby, MD   50 mcg at 04/18/17 0726  . fluticasone (FLONASE) 50 MCG/ACT nasal spray 2 spray  2 spray Each Nare Daily  Awilda Bill, NP   2 spray at 04/20/17 0907  . folic acid (FOLVITE) tablet 1 mg  1 mg Oral Daily Laverle Hobby, MD   1 mg at 04/20/17 0907  . insulin aspart (novoLOG) injection 0-15 Units  0-15 Units Subcutaneous TID WC Laverle Hobby, MD   3 Units at 04/20/17 1715  . insulin aspart (novoLOG) injection 0-5 Units  0-5 Units Subcutaneous QHS Laverle Hobby, MD      . ipratropium-albuterol (DUONEB) 0.5-2.5 (3) MG/3ML nebulizer solution 3 mL  3 mL Nebulization Q6H Wilhelmina Mcardle, MD   3 mL at 04/20/17 1359  . [START ON 04/21/2017] levothyroxine (SYNTHROID, LEVOTHROID) tablet 100 mcg  100 mcg Oral QAC breakfast Manuella Ghazi, Vipul, MD      . lip balm (BLISTEX) ointment   Topical PRN Laverle Hobby, MD      . Derrill Memo ON 04/21/2017] loratadine (CLARITIN) tablet 10 mg  10 mg Oral Daily Manuella Ghazi, Vipul, MD      . LORazepam (ATIVAN) injection 0.5-1 mg  0.5-1 mg Intravenous Q4H PRN Wilhelmina Mcardle, MD   0.5 mg at 04/19/17 1105  . MEDLINE mouth rinse  15 mL Mouth Rinse BID Gladstone Lighter, MD   15 mL at 04/19/17  2209  . methylPREDNISolone sodium succinate (SOLU-MEDROL) 40 mg/mL injection 40 mg  40 mg Intravenous Q12H Awilda Bill, NP   40 mg at 04/20/17 1155  . multivitamin liquid 15 mL  15 mL Oral Daily Fritzi Mandes, MD   15 mL at 04/20/17 0907  . ondansetron (ZOFRAN) injection 4 mg  4 mg Intravenous Q6H PRN Gouru, Aruna, MD   4 mg at 04/14/17 1930  . oxyCODONE (Oxy IR/ROXICODONE) immediate release tablet 5 mg  5 mg Oral Q6H PRN Max Sane, MD   5 mg at 04/20/17 1725  . thiamine (VITAMIN B-1) tablet 100 mg  100 mg Oral Daily Laverle Hobby, MD   100 mg at 04/20/17 0907    Musculoskeletal: Strength & Muscle Tone: decreased Gait & Station: unable to stand Patient leans: N/A  Psychiatric Specialty Exam: Physical Exam  Nursing note and vitals reviewed. Constitutional: He appears well-developed and well-nourished.  HENT:  Head: Normocephalic and atraumatic.  Eyes:  Conjunctivae are normal. Pupils are equal, round, and reactive to light.  Neck: Normal range of motion.  Cardiovascular: Normal heart sounds.   GI: Soft.  Musculoskeletal: Normal range of motion.  Neurological: He is alert.  Skin: Skin is warm and dry.  Psychiatric: Judgment normal. His affect is blunt. His speech is delayed. He is slowed. Thought content is not paranoid. He expresses no homicidal and no suicidal ideation. He is communicative. He exhibits abnormal recent memory.    Review of Systems  Psychiatric/Behavioral: Positive for memory loss. Negative for depression, hallucinations, substance abuse and suicidal ideas. The patient is not nervous/anxious and does not have insomnia.     Blood pressure 115/75, pulse 91, temperature 97.9 F (36.6 C), resp. rate 18, height 6' 1"  (1.854 m), weight 119.3 kg (263 lb 0.1 oz), SpO2 96 %.Body mass index is 34.7 kg/m.  General Appearance: Disheveled  Eye Contact:  Fair  Speech:  Negative  Volume:  Decreased  Mood:  Negative  Affect:  Blunt  Thought Process:  NA  Orientation:  Negative  Thought Content:  Negative  Suicidal Thoughts:  No  Homicidal Thoughts:  No  Memory:  Negative  Judgement:  Negative  Insight:  Negative  Psychomotor Activity:  Negative  Concentration:  Concentration: Negative  Recall:  NA  Fund of Knowledge:  Negative  Language:  Negative  Akathisia:  Negative  Handed:  Right  AIMS (if indicated):     Assets:  Housing Social Support  ADL's:  Impaired  Cognition:  Impaired,  Moderate  Sleep:        Treatment Plan Summary: Daily contact with patient to assess and evaluate symptoms and progress in treatment, Medication management and Plan 65 year old man with a history of depression and alcohol abuse who came into the hospital after multiple falls and required several days in the intensive care unit because of delirium. Patient seems to be finally recovering mentally. More alert although still quite slow. No  indication to change any medication or treatment at this point. Gave him some support of education about what was going on with him. I will continue to follow-up as needed.  Disposition: Patient does not meet criteria for psychiatric inpatient admission. Supportive therapy provided about ongoing stressors.  Alethia Berthold, MD 04/20/2017 7:41 PM

## 2017-04-20 NOTE — Care Management Important Message (Signed)
Important Message  Patient Details  Name: Arthur Conner MRN: [id number] Date of Birth: Apr 06, 1951   Medicare Important Message Given:  Yes    [name], [name], RN 04/20/2017, 3:53 PM

## 2017-04-20 NOTE — Progress Notes (Signed)
OT Cancellation Note  Patient Details Name: Arthur AversJoseph L Pastrana MRN: [id number] DOB: [birth year]   Cancelled Treatment:    Reason Eval/Treat Not Completed: Other (comment). Order received. Chart reviewed. Upon attempt, MD in with patient with family in room. Will re-attempt OT evaluation at later date/time as pt is available and as schedule allows.   Richrd PrimeJamie Stiller, MPH, MS, OTR/L ascom [phone] 04/20/17, 3:51 PM

## 2017-04-20 NOTE — Plan of Care (Signed)
Problem: Education: Goal: Knowledge of [facility] Education information/materials will improve Outcome: Not Progressing Patient confused at times.  Problem: Health Behavior/Discharge Planning: Goal: Ability to manage health-related needs will improve Outcome: Not Progressing Patient needs assistance.  Problem: Activity: Goal: Risk for activity intolerance will decrease Outcome: Not Progressing Patient has weakness and has a PT eval pending.  Problem: Education: Goal: Knowledge of [facility] Education information/materials will improve Outcome: Not Progressing Patient needs assistance.  Problem: Health Behavior/Discharge Planning: Goal: Ability to manage health-related needs will improve Outcome: Not Progressing Patient needs assistance.

## 2017-04-20 NOTE — Progress Notes (Signed)
[facility]* [facility]     Assessment and Plan:  Acute hypoxic respiratory failure secondary to pneumonia  S/P right chest tube due to right pneumothorax likely secondary to trauma .  -continue antibiotics to Complete course. -I personally reviewed. Chest x-ray images, this shows improved/resolving pneumothorax. RN instructed to placed chest tube to waterseal, we will clamp chest tube tonight, and repeat chest x-ray tomorrow. -Wean down oxygen, and advance activity as tolerated. -Discussed plan with family at bedside.  Date: 04/20/2017  MRN# [id number] Arthur Conner 20-Jun-1951   Arthur Conner is a 66 y.o. old male seen in follow up for chief complaint of  Chief Complaint  Patient presents with  . Weakness  . Nausea     HPI:     Medication:    Current Facility-Administered Medications:  .  acetaminophen (TYLENOL) tablet 650 mg, 650 mg, Oral, Q6H PRN **OR** acetaminophen (TYLENOL) suppository 650 mg, 650 mg, Rectal, Q6H PRN, Gouru, Aruna, MD .  albuterol (PROVENTIL) (2.5 MG/3ML) 0.083% nebulizer solution 2.5 mg, 2.5 mg, Nebulization, Q3H PRN, Merwyn KatosSimonds, David B, MD, 2.5 mg at 04/19/17 1057 .  [START ON 04/21/2017] allopurinol (ZYLOPRIM) tablet 300 mg, 300 mg, Oral, Daily, Sherryll BurgerShah, Vipul, MD .  budesonide (PULMICORT) nebulizer solution 0.25 mg, 0.25 mg, Nebulization, Q6H, Billy FischerSimonds, David B, MD, 0.25 mg at 04/20/17 1400 .  doxycycline (VIBRA-TABS) tablet 100 mg, 100 mg, Oral, Q12H, Shane Crutchamachandran, Jamayah Myszka, MD, 100 mg at 04/20/17 0907 .  enoxaparin (LOVENOX) injection 40 mg, 40 mg, Subcutaneous, Q24H, Gouru, Aruna, MD, 40 mg at 04/19/17 2209 .  feeding supplement (ENSURE ENLIVE) (ENSURE ENLIVE) liquid 237 mL, 237 mL, Oral, BID BM, Enedina FinnerPatel, Sona, MD, 237 mL at 04/20/17 1350 .  fentaNYL (SUBLIMAZE) injection 25-100 mcg, 25-100 mcg, Intravenous, Q2H PRN, Shane Crutchamachandran, Cece Milhouse, MD, 50 mcg at 04/18/17 0726 .  fluticasone (FLONASE) 50 MCG/ACT nasal spray 2 spray, 2 spray, Each  Nare, Daily, Eugenie NorrieBlakeney, Dana G, NP, 2 spray at 04/20/17 0907 .  folic acid (FOLVITE) tablet 1 mg, 1 mg, Oral, Daily, Shane Crutchamachandran, Blanton Kardell, MD, 1 mg at 04/20/17 0907 .  insulin aspart (novoLOG) injection 0-15 Units, 0-15 Units, Subcutaneous, TID WC, Shane Crutchamachandran, Minka Knight, MD, 5 Units at 04/20/17 1154 .  insulin aspart (novoLOG) injection 0-5 Units, 0-5 Units, Subcutaneous, QHS, Eldred Lievanos, MD .  ipratropium-albuterol (DUONEB) 0.5-2.5 (3) MG/3ML nebulizer solution 3 mL, 3 mL, Nebulization, Q6H, Merwyn KatosSimonds, David B, MD, 3 mL at 04/20/17 1359 .  [START ON 04/21/2017] levothyroxine (SYNTHROID, LEVOTHROID) tablet 100 mcg, 100 mcg, Oral, QAC breakfast, Sherryll BurgerShah, Vipul, MD .  lip balm (BLISTEX) ointment, , Topical, PRN, Shane Crutchamachandran, Yazmen Briones, MD .  Melene Muller[START ON 04/21/2017] loratadine (CLARITIN) tablet 10 mg, 10 mg, Oral, Daily, Sherryll BurgerShah, Vipul, MD .  LORazepam (ATIVAN) injection 0.5-1 mg, 0.5-1 mg, Intravenous, Q4H PRN, Merwyn KatosSimonds, David B, MD, 0.5 mg at 04/19/17 1105 .  MEDLINE mouth rinse, 15 mL, Mouth Rinse, BID, Kalisetti, Radhika, MD, 15 mL at 04/19/17 2209 .  methylPREDNISolone sodium succinate (SOLU-MEDROL) 40 mg/mL injection 40 mg, 40 mg, Intravenous, Q12H, Blakeney, Dana G, NP, 40 mg at 04/20/17 1155 .  multivitamin liquid 15 mL, 15 mL, Oral, Daily, Enedina FinnerPatel, Sona, MD, 15 mL at 04/20/17 0907 .  [DISCONTINUED] ondansetron (ZOFRAN) tablet 4 mg, 4 mg, Oral, Q6H PRN **OR** ondansetron (ZOFRAN) injection 4 mg, 4 mg, Intravenous, Q6H PRN, Gouru, Aruna, MD, 4 mg at 04/14/17 1930 .  oxyCODONE (Oxy IR/ROXICODONE) immediate release tablet 5 mg, 5 mg, Oral, Q6H PRN, Delfino LovettShah, Vipul, MD .  thiamine (  VITAMIN B-1) tablet 100 mg, 100 mg, Oral, Daily, Shane Crutch, MD, 100 mg at 04/20/17 0907   Allergies:  Toprol xl [metoprolol tartrate]  Review of Systems: Gen:  Denies  fever, sweats. HEENT: Denies blurred vision. Cvc:  No dizziness, chest pain or heaviness Resp:   Denies cough or sputum porduction. Gi: Denies  swallowing difficulty, stomach pain. constipation, bowel incontinence Gu:  Denies bladder incontinence, burning urine Ext:   No Joint pain, stiffness. Skin: No skin rash, easy bruising. Endoc:  No polyuria, polydipsia. Psych: No depression, insomnia. Other:  All other systems were reviewed and found to be negative other than what is mentioned in the HPI.   Physical Examination:   VS: BP 115/75 (BP Location: Right Arm)   Pulse 91   Temp 97.9 F (36.6 C)   Resp 18   Ht 6\' 1"  (1.854 m)   Wt 119.3 kg (263 lb 0.1 oz)   SpO2 96%   BMI 34.70 kg/m    General Appearance: No distress  Neuro:without focal findings,  speech normal,  HEENT: PERRLA, EOM intact. Pulmonary: normal breath sounds, No wheezing.   CardiovascularNormal S1,S2.  No m/r/g.   Abdomen: Benign, Soft, non-tender. Renal:  No costovertebral tenderness  GU:  Not performed at this time. Endoc: No evident thyromegaly, no signs of acromegaly. Skin:   warm, no rash. Extremities: normal, no cyanosis, clubbing.   LABORATORY PANEL:   CBC  Recent Labs Lab 04/19/17 0524  WBC 8.8  HGB 11.9*  HCT 33.9*  PLT 209   ------------------------------------------------------------------------------------------------------------------  Chemistries   Recent Labs Lab 04/19/17 0524  NA 134*  K 4.1  CL 97*  CO2 28  GLUCOSE 181*  BUN 18  CREATININE 0.76  CALCIUM 8.7*  MG 2.2  AST 21  ALT 44  ALKPHOS 46  BILITOT 0.9   ------------------------------------------------------------------------------------------------------------------  Cardiac Enzymes No results for input(s): TROPONINI in the last 168 hours. ------------------------------------------------------------  RADIOLOGY:    Results for orders placed in visit on 11/17/11  DG Chest 2 View   Narrative * PRIOR REPORT IMPORTED FROM AN EXTERNAL SYSTEM *   PRIOR REPORT IMPORTED FROM THE [facility] WORKFLOW SYSTEM   REASON FOR EXAM:    chronic cough  COMMENTS:    PROCEDURE:     KDR - KDXR CHEST PA (OR AP) AND LAT  - Nov 17 2011  4:18PM   RESULT:     Comparison is made to the prior exam of 04/29/2011. The lung  fields are clear. Heart size is normal. There is deformity of the fourth  left posterior rib that likely is due to an old fracture that has healed.   IMPRESSION:  1.     No acute changes are identified.   Thank you for the opportunity to contribute to the care of your patient.       ------------------------------------------------------------------------------------------------------------------  Thank  you for allowing [facility] to assist in the care of your patient. Our recommendations are noted above.  Please contact us if we can be of further service.   Wells Guiles, MD.  [facility] Office Number: [phone]  Santiago Glad, M.D.  Billy Fischer, M.D  04/20/2017

## 2017-04-20 NOTE — Progress Notes (Signed)
[employer] [facility] [place] at [facility]   PATIENT NAME: Arthur Conner    MR#:  [id number]  DATE OF BIRTH:  1951/05/10  SUBJECTIVE:  More awake and alert, c/o pain, son at bedside questioning duration of chest tube REVIEW OF SYSTEMS:  Review of Systems  Constitutional: Positive for malaise/fatigue. Negative for chills, fever and weight loss.  HENT: Negative for nosebleeds and sore throat.   Eyes: Negative for blurred vision.  Respiratory: Negative for cough, shortness of breath and wheezing.   Cardiovascular: Negative for chest pain, orthopnea, leg swelling and PND.  Gastrointestinal: Negative for abdominal pain, constipation, diarrhea, heartburn, nausea and vomiting.  Genitourinary: Negative for dysuria and urgency.  Musculoskeletal: Positive for joint pain and myalgias. Negative for back pain.  Skin: Negative for rash.  Neurological: Positive for weakness. Negative for dizziness, speech change, focal weakness and headaches.  Endo/Heme/Allergies: Does not bruise/bleed easily.  Psychiatric/Behavioral: Negative for depression.   DRUG ALLERGIES:   Allergies  Allergen Reactions  . Toprol Xl [Metoprolol Tartrate] Other (See Comments)    Bradycardia    VITALS:  Blood pressure 131/72, pulse 99, temperature 98.1 F (36.7 C), temperature source Oral, resp. rate (!) 24, height 6\' 1"  (1.854 m), weight 119.3 kg (263 lb 0.1 oz), SpO2 95 %.  PHYSICAL EXAMINATION:  Physical Exam  GENERAL:  66 y.o.-year-old patient lying in the bed with no acute distress. Restless at times. EYES: Pupils equal, round, reactive to light and accommodation. No scleral icterus. Extraocular muscles intact.  HEENT: Head atraumatic, normocephalic. Oropharynx and nasopharynx clear.  NECK:  Supple, no jugular venous distention. No thyroid enlargement, no tenderness.  LUNGS: Normal breath sounds bilaterally, no wheezing, rales,rhonchi or crepitation. No use of accessory muscles of respiration.  Decreased bibasilar breath sounds CT placement right side CARDIOVASCULAR: S1, S2 normal. No murmurs, rubs, or gallops.  ABDOMEN: Soft, nontender, nondistended. Bowel sounds present. No organomegaly or mass.  EXTREMITIES: No pedal edema, cyanosis, or clubbing.  NEUROLOGIC:  Cranial nerves seem to be intact, able to move all 4 extremities in bed. Sensation is intact. Following simple commands. PSYCHIATRIC: The patient is slightly more alert today, oriented x 1-2, following simple commands  SKIN: No obvious rash, lesion, or ulcer. Left leg is bruised LABORATORY PANEL:   CBC  Recent Labs Lab 04/19/17 0524  WBC 8.8  HGB 11.9*  HCT 33.9*  PLT 209   ------------------------------------------------------------------------------------------------------------------  Chemistries   Recent Labs Lab 04/19/17 0524  NA 134*  K 4.1  CL 97*  CO2 28  GLUCOSE 181*  BUN 18  CREATININE 0.76  CALCIUM 8.7*  MG 2.2  AST 21  ALT 44  ALKPHOS 46  BILITOT 0.9   ------------------------------------------------------------------------------------------------------------------  Cardiac Enzymes No results for input(s): TROPONINI in the last 168 hours. ------------------------------------------------------------------------------------------------------------------  RADIOLOGY:  Dg Chest 1 View  Result Date: 04/20/2017 CLINICAL DATA:  Dyspnea EXAM: CHEST 1 VIEW COMPARISON:  Chest x-rays dated 04/19/2017, 04/18/2017 and 04/17/2017. FINDINGS: Right-sided chest tube in place, grossly stable in position. No residual pneumothorax seen on today's exam. Continued improvement in aeration bilaterally. No new lung abnormality. Heart size and mediastinal contours appear stable. IMPRESSION: 1. Improved aeration bilaterally, presumably decreased edema and/or airspace collapse. 2. Right-sided chest tube stable in position. No residual pneumothorax seen on today's exam. Electronically Signed   By: Bary Richard M.D.    On: 04/20/2017 07:03   Dg Chest 1 View  Result Date: 04/19/2017 CLINICAL DATA:  66 year old male with dyspnea. Subsequent encounter. EXAM: CHEST 1  VIEW COMPARISON:  04/18/2017. FINDINGS: Right-sided chest tube remains in place. Configuration appears different than on prior exam and therefore may have shifted position. Slight decrease in size of right apical pneumothorax now approximately 5-10%. Wedged shaped consolidation right mid lung stable. Pulmonary vascular congestion/ pulmonary edema. Cardiomegaly. Calcified slightly tortuous aorta. Remote left clavicle fracture. IMPRESSION: Change in configuration of right chest tube. Slight decrease in size of right apical pneumothorax now approximately 5-10%. Consolidation right mid lung stable. Pulmonary vascular congestion/ mild pulmonary edema stable. Cardiomegaly. Aortic atherosclerosis. Electronically Signed   By: Lacy DuverneySteven  Olson M.D.   On: 04/19/2017 06:57    EKG:   Orders placed or performed during the hospital encounter of 04/14/17  . ED EKG  . ED EKG  . EKG 12-Lead  . EKG 12-Lead    ASSESSMENT AND PLAN:  66 year old male with past medical history significant for depression and anxiety, hypertension, alcohol abuse presents to the hospital secondary to altered mental status and falls and noted to have hyponatremia.  #1 Hyponatremia-likely acute, beer potomania and also poor oral intake. -slowly improving sodium, now at 134, was on NS @ 50cc/hr. - Appreciate nephrology consult. -came in with critically low sodium of 105---113--118--128--134 - no labs today  #2 Altered mental status-metabolic encephalopathy secondary to hyponatremia and DTs. -Continue to monitor. No focal deficits. Improving now, more alert and following some simple commands today -CT of the head with no acute findings. - watch closely as starting prn oxycodone for his pain  #3 alcohol abuse with possible alcoholic liver disease-will need counseling. -Some agitation likely  from withdrawals. Off Precedex drip, ativan PRN. -Psych consult appreciated.  #4 fall-secondary to intoxication. X-rays revealed either subacute or old right tibial plateau fractures. - Orthopedics consult appreciated. Physical therapy recommends STR/SNF - MRI showing it to be remote fracture  #5 Dysphagia- per son, patient had trouble swallowing and nausea and vomiting prior to admission. Followed by speech   #6 tachypnea and hypoxia due to right upper lobe pneumonia and upper lobe pneumothorax secondary to trauma with fall - CXR with RUL infiltrate- possible aspiration pneumonia - on Doxy -CXR showed right UL PTX now has CT placed on 04/18/17  #7 DVT prophylaxis-Lovenox   Physical therapy recommends rehabilitation. Social worker for discharge planning.  DC tele, advance diet to soft and c/s PT,OT   All the records are reviewed and case discussed with Care Management/Social Worker. Management plans discussed with the patient, son, nursing and they are in agreement.  CODE STATUS: Full Code  TOTAL TIME TAKING CARE OF THIS PATIENT: 25 minutes.     Delfino LovettVipul Reghan Thul M.D on 04/20/2017 at 1:37 PM  Between 7am to 6pm - Pager - [phone]  After 6pm go to [email or web address] - Social research officer, governmentpassword EPAS [facility]  [facility]  Office  [phone]  CC: Primary care physician; Steele Sizerrissman, Mark A, MD

## 2017-04-20 NOTE — Progress Notes (Signed)
Primary nurse called and spoke to Dr. Anne HahnWillis in regard to the orders placed by Dr. Nicholos Johnsamachandran to CLAMP CHEST TUBE. Primary nurse informed Dr. Anne HahnWillis that this was not our normal nursing practice. Dr. Anne HahnWillis stated that he will  discontinue the order for tonight. Primary nurse to continue to monitor pt.

## 2017-04-21 ENCOUNTER — Inpatient Hospital Stay: Payer: Medicare Other

## 2017-04-21 LAB — GLUCOSE, CAPILLARY
GLUCOSE-CAPILLARY: 178 mg/dL — AB (ref 65–99)
GLUCOSE-CAPILLARY: 175 mg/dL — AB (ref 65–99)
GLUCOSE-CAPILLARY: 198 mg/dL — AB (ref 65–99)
Glucose-Capillary: 180 mg/dL — ABNORMAL HIGH (ref 65–99)

## 2017-04-21 LAB — PROCALCITONIN: Procalcitonin: <0.1 ng/mL

## 2017-04-21 LAB — BASIC METABOLIC PANEL
ANION GAP: 8 (ref 5–15)
BUN: 20 mg/dL (ref 6–20)
CO2: 30 mmol/L (ref 22–32)
CREATININE: 0.61 mg/dL (ref 0.61–1.24)
Calcium: 8.7 mg/dL — ABNORMAL LOW (ref 8.9–10.3)
Chloride: 95 mmol/L — ABNORMAL LOW (ref 101–111)
GFR calc non Af Amer: >60 mL/min (ref >60)
Glucose, Bld: 168 mg/dL — ABNORMAL HIGH (ref 65–99)
Potassium: 4.6 mmol/L (ref 3.5–5.1)
SODIUM: 133 mmol/L — AB (ref 135–145)

## 2017-04-21 LAB — CBC
HEMATOCRIT: 34.4 % — AB (ref 40.0–52.0)
Hemoglobin: 12 g/dL — ABNORMAL LOW (ref 13.0–18.0)
MCH: 34.2 pg — ABNORMAL HIGH (ref 26.0–34.0)
MCHC: 35 g/dL (ref 32.0–36.0)
MCV: 97.6 fL (ref 80.0–100.0)
PLATELETS: 230 10*3/uL (ref 150–440)
RBC: 3.52 MIL/uL — ABNORMAL LOW (ref 4.40–5.90)
RDW: 15 % — AB (ref 11.5–14.5)
WBC: 10.1 10*3/uL (ref 3.8–10.6)

## 2017-04-21 LAB — PHOSPHORUS: PHOSPHORUS: 4.6 mg/dL (ref 2.5–4.6)

## 2017-04-21 LAB — MAGNESIUM: MAGNESIUM: 1.8 mg/dL (ref 1.7–2.4)

## 2017-04-21 MED ORDER — ADULT MULTIVITAMIN W/MINERALS CH
1.0000 | ORAL_TABLET | Freq: Every day | ORAL | Status: DC
  Filled 2017-04-21: qty 1

## 2017-04-21 NOTE — Evaluation (Signed)
Occupational Therapy Evaluation Patient Details Name: Arthur Conner MRN: [id number] DOB: 12/22/1950 Today's Date: 04/21/2017    History of Present Illness 66 year old male with past medical history significant for depression and anxiety, hypertension, alcohol abuse presents to the hospital secondary to altered mental status and falls and noted to have hyponatremia.   Clinical Impression   Pt seen for OT evaluation this date. Pt was independent at baseline with ADL, IADL including driving and working as a Pharmacist, hospital. Over the past month, son reports pt was unable to eat for approx. 3 weeks and pt began experiencing increased falls with pt stating "I started falling and couldn't stop." Pt seated on the bed with legs crossed upon OT's arrival without back support for duration of session and son present. Pt recently declined to work with PT due to fatigue, requesting PT come back later. Pt reports unable to get much sleep. Pt presents with deficits in strength/activity tolerance and 5/10 pain "everywhere". Pt has fair insight into deficits. Education provided to pt and son regarding role of OT, considerations for SNF vs HH. Son unable to provide physical assist or 24/7 supervision for pt and attempted to encourage pt to consider STR in order to recover more quickly. Pt concerned "because I have a lot of stuff up in the air" regarding his business dealings. Son willing to assist pt with work-related activities while pt is in STR. Pt politely declined any out of bed activity during session. Pt encouraged to work with PT later. Pt will benefit from skilled OT services to address noted impairments in strength and activity tolerance in order to maximize functional return to PLOF and minimize risk of future falls/injury/rehospitalization/increase in caregiver burden. Will continue to assess for appropriateness for [facility] services.    Follow Up Recommendations  SNF    Equipment  Recommendations  None recommended by OT    Recommendations for Other Services       Precautions / Restrictions Precautions Precautions: Fall Restrictions Weight Bearing Restrictions: No      Mobility Bed Mobility Overal bed mobility: Modified Independent       Supine to sit: Modified independent (Device/Increase time);HOB elevated Sit to supine: Modified independent (Device/Increase time)   General bed mobility comments: pt able to perform with increased effort but no physical assist required  Transfers                 General transfer comment: pt declined OOB     Balance Overall balance assessment: History of Falls Sitting-balance support: No upper extremity supported;Feet supported Sitting balance-Leahy Scale: Fair Sitting balance - Comments: pt sitting cross legged in bed with no LOB, occasionally leaning forward with elbows on his legs                                   ADL either performed or assessed with clinical judgement   ADL Overall ADL's : Needs assistance/impaired Eating/Feeding: Sitting;Set up   Grooming: Sitting;Set up   Upper Body Bathing: Bed level;Set up   Lower Body Bathing: Bed level;Set up;Supervison/ safety   Upper Body Dressing : Sitting;Set up   Lower Body Dressing: Bed level;Set up Lower Body Dressing Details (indicate cue type and reason): pt sitting cross legged in bed, able to don/doff socks without assist   Toilet Transfer Details (indicate cue type and reason): deferred due to pt declining out of bed  General ADL Comments: pt generally at set up/supervision level for ADL from bed level, likely would need greater assistance for seated/standing ADL activities based on poor activity tolerance     Vision Baseline Vision/History: Wears glasses Wears Glasses: At all times Patient Visual Report: No change from baseline Vision Assessment?: No apparent visual deficits     Perception     Praxis       Pertinent Vitals/Pain Pain Assessment: 0-10 Pain Score: 5  Pain Location: "feels like just about everywhere hurts" Pain Intervention(s): Limited activity within patient's tolerance;Monitored during session     Hand Dominance     Extremity/Trunk Assessment Upper Extremity Assessment Upper Extremity Assessment: Overall WFL for tasks assessed (ROM WFL, grossly 4+/5, excellent grip strength)   Lower Extremity Assessment Lower Extremity Assessment: Generalized weakness;Defer to PT evaluation   Cervical / Trunk Assessment Cervical / Trunk Assessment: Normal   Communication Communication Communication: No difficulties   Cognition Arousal/Alertness: Awake/alert Behavior During Therapy: WFL for tasks assessed/performed (generally low energy but appropriate during session) Overall Cognitive Status: Within Functional Limits for tasks assessed                                 General Comments: A&Ox4, follows commands, fair insight into deficits   General Comments  bruises and scabs on BLE, healing, son notes they look much better    Exercises Other Exercises Other Exercises: pt/son educated in role of OT and considerations for recommendations (SNF vs home), pt/son verbalized understanding   Shoulder Instructions      Home Living Family/patient expects to be discharged to:: Unsure ([facility]) Living Arrangements: Alone Available Help at Discharge:  (family unable to provide 24/7 care) Type of Home: House ("over 6,000 square feet") Home Access: Stairs to enter Entergy CorporationEntrance Stairs-Number of Steps: 3 Entrance Stairs-Rails: Left Home Layout: One level     Bathroom Shower/Tub: Producer, television/film/videoWalk-in shower   Bathroom Toilet: Handicapped height     Home Equipment: Shower seat - built in;Walker - 2 wheels;Cane - single point;Bedside commode          Prior Functioning/Environment Level of Independence: Needs assistance  Gait / Transfers  Assistance Needed: up until the last month or so, pt ambulating with SPC without difficulty ADL's / Homemaking Assistance Needed: up until the last month, pt indep with basic ADL, IADL including driving, working (buy/sell Multimedia programmercommercial real estate)   Comments: pt endorses multiple recent falls "I started falling and coudln't stop", was using RW, son reports legs buckled underneath him, pt states for approx 3 weeks pt wasn't able to eat which led to pt getting weak and falling        OT Problem List: Decreased strength;Decreased activity tolerance;Decreased knowledge of use of DME or AE      OT Treatment/Interventions: Self-care/ADL training;Therapeutic exercise;Therapeutic activities;Energy conservation;DME and/or AE instruction;Patient/family education    OT Goals(Current goals can be found in the care plan section) Acute Rehab OT Goals Patient Stated Goal: get better OT Goal Formulation: With patient/family Time For Goal Achievement: 05/05/17 Potential to Achieve Goals: Good  OT Frequency: Min 2X/week   Barriers to D/C: Decreased caregiver support;Inaccessible home environment          Co-evaluation              AM-PAC PT "6 Clicks" Daily Activity     Outcome Measure Help from another person eating  meals?: None Help from another person taking care of personal grooming?: None Help from another person toileting, which includes using toliet, bedpan, or urinal?: A Lot Help from another person bathing (including washing, rinsing, drying)?: A Lot Help from another person to put on and taking off regular upper body clothing?: None Help from another person to put on and taking off regular lower body clothing?: A Lot 6 Click Score: 18   End of Session    Activity Tolerance: Patient tolerated treatment well;Patient limited by fatigue (declined OOB activity) Patient left: in bed;with call bell/phone within reach;with bed alarm set;with family/visitor present;Other (comment) (MD in  room)  OT Visit Diagnosis: Repeated falls (R29.6);Other abnormalities of gait and mobility (R26.89);Muscle weakness (generalized) (M62.81)                Time: 3086-5784 OT Time Calculation (min): 26 min Charges:  OT General Charges $OT Visit: 1 Procedure OT Evaluation $OT Eval Low Complexity: 1 Procedure OT Treatments $Therapeutic Activity: 8-22 mins G-Codes:     Richrd Prime, MPH, MS, OTR/L ascom [phone] 04/21/17, 9:55 AM

## 2017-04-21 NOTE — Progress Notes (Signed)
[employer] - [place] at [facility]   PATIENT NAME: Arthur Conner    MR#:  [id number]  DATE OF BIRTH:  12/20/1950  SUBJECTIVE:  More awake and alert, agreeable for rehab REVIEW OF SYSTEMS:  Review of Systems  Constitutional: Positive for malaise/fatigue. Negative for chills, fever and weight loss.  HENT: Negative for nosebleeds and sore throat.   Eyes: Negative for blurred vision.  Respiratory: Negative for cough, shortness of breath and wheezing.   Cardiovascular: Negative for chest pain, orthopnea, leg swelling and PND.  Gastrointestinal: Negative for abdominal pain, constipation, diarrhea, heartburn, nausea and vomiting.  Genitourinary: Negative for dysuria and urgency.  Musculoskeletal: Positive for joint pain and myalgias. Negative for back pain.  Skin: Negative for rash.  Neurological: Positive for weakness. Negative for dizziness, speech change, focal weakness and headaches.  Endo/Heme/Allergies: Does not bruise/bleed easily.  Psychiatric/Behavioral: Negative for depression.   DRUG ALLERGIES:   Allergies  Allergen Reactions  . Toprol Xl [Metoprolol Tartrate] Other (See Comments)    Bradycardia    VITALS:  Blood pressure 139/88, pulse 96, temperature 98.6 F (37 C), resp. rate 20, height 6\' 1"  (1.854 m), weight 119.3 kg (263 lb 0.1 oz), SpO2 93 %.  PHYSICAL EXAMINATION:  Physical Exam  GENERAL:  66 y.o.-year-old patient lying in the bed with no acute distress. Restless at times. EYES: Pupils equal, round, reactive to light and accommodation. No scleral icterus. Extraocular muscles intact.  HEENT: Head atraumatic, normocephalic. Oropharynx and nasopharynx clear.  NECK:  Supple, no jugular venous distention. No thyroid enlargement, no tenderness.  LUNGS: Normal breath sounds bilaterally, no wheezing, rales,rhonchi or crepitation. No use of accessory muscles of respiration. Decreased bibasilar breath sounds CT placement right side CARDIOVASCULAR:  S1, S2 normal. No murmurs, rubs, or gallops.  ABDOMEN: Soft, nontender, nondistended. Bowel sounds present. No organomegaly or mass.  EXTREMITIES: No pedal edema, cyanosis, or clubbing.  NEUROLOGIC:  Cranial nerves seem to be intact, able to move all 4 extremities in bed. Sensation is intact. Following simple commands. PSYCHIATRIC: The patient is slightly more alert today, oriented x 1-2, following simple commands  SKIN: No obvious rash, lesion, or ulcer. Left leg is bruised LABORATORY PANEL:   CBC  Recent Labs Lab 04/21/17 0506  WBC 10.1  HGB 12.0*  HCT 34.4*  PLT 230   ------------------------------------------------------------------------------------------------------------------  Chemistries   Recent Labs Lab 04/19/17 0524 04/21/17 0506  NA 134* 133*  K 4.1 4.6  CL 97* 95*  CO2 28 30  GLUCOSE 181* 168*  BUN 18 20  CREATININE 0.76 0.61  CALCIUM 8.7* 8.7*  MG 2.2 1.8  AST 21  --   ALT 44  --   ALKPHOS 46  --   BILITOT 0.9  --    ------------------------------------------------------------------------------------------------------------------  Cardiac Enzymes No results for input(s): TROPONINI in the last 168 hours. ------------------------------------------------------------------------------------------------------------------  RADIOLOGY:  Dg Chest 1 View  Result Date: 04/21/2017 CLINICAL DATA:  Dyspnea. EXAM: CHEST 1 VIEW COMPARISON:  04/20/2017 FINDINGS: The patient is slightly rotated to the left compared to yesterday's examination. A right-sided chest tube remains in place though has a slightly different configuration than on the prior study. The cardiomediastinal silhouette is unchanged. Bilateral mid and lower lung interstitial type opacities have mildly increased. There are small right and possibly small left pleural effusions. No pneumothorax is identified. A remote nonunited left clavicle fracture is noted. IMPRESSION: 1. Mildly increased bilateral lung  opacities which may reflect mild interstitial edema. Small right pleural effusion. 2. No  pneumothorax. Electronically Signed   By: Sebastian AcheAllen  Grady M.D.   On: 04/21/2017 07:31   Dg Chest 1 View  Result Date: 04/20/2017 CLINICAL DATA:  Dyspnea EXAM: CHEST 1 VIEW COMPARISON:  Chest x-rays dated 04/19/2017, 04/18/2017 and 04/17/2017. FINDINGS: Right-sided chest tube in place, grossly stable in position. No residual pneumothorax seen on today's exam. Continued improvement in aeration bilaterally. No new lung abnormality. Heart size and mediastinal contours appear stable. IMPRESSION: 1. Improved aeration bilaterally, presumably decreased edema and/or airspace collapse. 2. Right-sided chest tube stable in position. No residual pneumothorax seen on today's exam. Electronically Signed   By: Bary RichardStan  Maynard M.D.   On: 04/20/2017 07:03    EKG:   Orders placed or performed during the hospital encounter of 04/14/17  . ED EKG  . ED EKG  . EKG 12-Lead  . EKG 12-Lead    ASSESSMENT AND PLAN:  66 year old male with past medical history significant for depression and anxiety, hypertension, alcohol abuse presents to the hospital secondary to altered mental status and falls and noted to have hyponatremia.  #1 Hyponatremia-likely acute, beer potomania and also poor oral intake. -slowly improving sodium, now at 134, was on NS @ 50cc/hr. - Appreciate nephrology consult. -came in with critically low sodium of 105---113--118--128--134 -> 133   #2 Altered mental status-metabolic encephalopathy secondary to hyponatremia and DTs. -Continue to monitor. No focal deficits. Improving now, more alert and following some simple commands today -CT of the head with no acute findings. - watch closely while on as needed oxycodone for his pain  #3 alcohol abuse with possible alcoholic liver disease-will need counseling. -Some agitation likely from withdrawals. Off Precedex drip, ativan PRN. -Psych consult appreciated.  #4  fall-secondary to intoxication. X-rays revealed either subacute or old right tibial plateau fractures. - Orthopedics consult appreciated. Physical therapy recommends STR/SNF - MRI showing it to be remote fracture  #5 Dysphagia- per son, patient had trouble swallowing and nausea and vomiting prior to admission. Followed by speech   #6 tachypnea and hypoxia due to right upper lobe pneumonia and upper lobe pneumothorax secondary to trauma with fall - CXR with RUL infiltrate- possible aspiration pneumonia - on Doxy -CXR showed right UL PTX now has CT placed on 04/18/17 -Pulmonary to decide on removal of chest tube  #7 DVT prophylaxis-Lovenox   Physical therapy recommends rehabilitation. Social worker for discharge planning.    All the records are reviewed and case discussed with Care Management/Social Worker. Management plans discussed with the patient, nursing and they are in agreement.  CODE STATUS: Full Code  TOTAL TIME TAKING CARE OF THIS PATIENT: 25 minutes.   Possible discharge tomorrow if placement available  Delfino LovettVipul Jorah Hua M.D on 04/21/2017 at 10:53 AM  Between 7am to 6pm - Pager - [phone]  After 6pm go to [email or web address] - Social research officer, governmentpassword EPAS [facility]  [facility] [place] [facility]  Office  [phone]  CC: Primary care physician; Steele Sizerrissman, Mark A, MD

## 2017-04-21 NOTE — Progress Notes (Signed)
MEDICATION RELATED CONSULT NOTE    Pharmacy Consult for electrolyte monitoring   Pharmacy consulted for electrolyte monitoring for 66 yo male admitted with33 hyponatremia and alcohol withdrawal. Patient previously receiving potassium in [facility]IV[facility].   6/12 K 4.1, Phos 3.5, Mag 2.2 6/14 K 4.6, Ca 8.7, alb 3, Adjusted calcium 9.5, Phos 4.6, Mg 1.8  Plan: Electrolytes WNL. Will recheck all electrolytes in three days with am labs.    Allergies  Allergen Reactions  . Toprol Xl [Metoprolol Tartrate] Other (See Comments)    Bradycardia    Patient Measurements: Height: 6\' 1"  (185.4 cm) Weight: 263 lb 0.1 oz (119.3 kg) IBW/kg (Calculated) : 79.9  Vital Signs: Temp: 98.6 F (37 C) (06/14 0424) BP: 139/88 (06/14 0424) Pulse Rate: 96 (06/14 0424) Intake/Output from previous day: 06/13 0701 - 06/14 0700 In: 240 [P.O.:240] Out: 300 [Urine:300] Intake/Output from this shift: No intake/output data recorded.  Labs:  Recent Labs  04/19/17 0524 04/21/17 0506  WBC 8.8 10.1  HGB 11.9* 12.0*  HCT 33.9* 34.4*  PLT 209 230  CREATININE 0.76 0.61  MG 2.2 1.8  PHOS 3.5 4.6  ALBUMIN 3.0*  --   PROT 6.6  --   AST 21  --   ALT 44  --   ALKPHOS 46  --   BILITOT 0.9  --    Estimated Creatinine Clearance: 124.6 mL/min (by C-G formula based on SCr of 0.61 mg/dL).   Pharmacy will continue to monitor and adjust per consult.   Carola FrostNathan A Yecenia Dalgleish, Pharm.D., BCPS Clinical Pharmacist 04/21/2017,8:57 AM

## 2017-04-21 NOTE — Progress Notes (Signed)
Subjective:  Patient is much more alert today.   I have been following the physical therapy notes. He is making progress. Patient states he is not having much pain in the lower extremities.  Objective:   VITALS:   Vitals:   04/21/17 0424 04/21/17 0750 04/21/17 1300 04/21/17 1540  BP: 139/88  135/83   Pulse: 96  96 (!) 106  Resp:   19   Temp: 98.6 F (37 C)  98.2 F (36.8 C)   TempSrc:   Oral   SpO2: 94% 93% 94% 95%  Weight:      Height:        PHYSICAL EXAM: Bilateral lower extremities: Patient's skin is intact. His superficial abrasion over the left knee which is healing. The ecchymosis is significantly improved since my last examination. His leg and thigh compartments are soft and compressible. He has palpable pedal pulses. Patient has intact sensation light touch in the lower extremity. He has no tenderness to palpation over the fibula or tibia of either lower extremity.  He can dorsiflex and plantarflex his ankle, flex and extend both knees. He has no pain with logrolling of his hips.   LABS  Results for orders placed or performed during the hospital encounter of 04/14/17 (from the past 24 hour(s))  Glucose, capillary     Status: Abnormal   Collection Time: 04/20/17  9:46 PM  Result Value Ref Range   Glucose-Capillary 161 (H) 65 - 99 mg/dL   Comment 1 Notify RN   Procalcitonin     Status: None   Collection Time: 04/21/17  5:06 AM  Result Value Ref Range   Procalcitonin <0.10 ng/mL  Basic metabolic panel     Status: Abnormal   Collection Time: 04/21/17  5:06 AM  Result Value Ref Range   Sodium 133 (L) 135 - 145 mmol/L   Potassium 4.6 3.5 - 5.1 mmol/L   Chloride 95 (L) 101 - 111 mmol/L   CO2 30 22 - 32 mmol/L   Glucose, Bld 168 (H) 65 - 99 mg/dL   BUN 20 6 - 20 mg/dL   Creatinine, Ser 1.610.61 0.61 - 1.24 mg/dL   Calcium 8.7 (L) 8.9 - 10.3 mg/dL   GFR calc non Af Amer >60 >60 mL/min   GFR calc Af Amer >60 >60 mL/min   Anion gap 8 5 - 15  Magnesium     Status: None    Collection Time: 04/21/17  5:06 AM  Result Value Ref Range   Magnesium 1.8 1.7 - 2.4 mg/dL  Phosphorus     Status: None   Collection Time: 04/21/17  5:06 AM  Result Value Ref Range   Phosphorus 4.6 2.5 - 4.6 mg/dL  CBC     Status: Abnormal   Collection Time: 04/21/17  5:06 AM  Result Value Ref Range   WBC 10.1 3.8 - 10.6 K/uL   RBC 3.52 (L) 4.40 - 5.90 MIL/uL   Hemoglobin 12.0 (L) 13.0 - 18.0 g/dL   HCT 09.634.4 (L) 04.540.0 - 40.952.0 %   MCV 97.6 80.0 - 100.0 fL   MCH 34.2 (H) 26.0 - 34.0 pg   MCHC 35.0 32.0 - 36.0 g/dL   RDW 81.115.0 (H) 91.411.5 - 78.214.5 %   Platelets 230 150 - 440 K/uL  Glucose, capillary     Status: Abnormal   Collection Time: 04/21/17  7:36 AM  Result Value Ref Range   Glucose-Capillary 178 (H) 65 - 99 mg/dL  Glucose, capillary     Status:  Abnormal   Collection Time: 04/21/17 11:44 AM  Result Value Ref Range   Glucose-Capillary 175 (H) 65 - 99 mg/dL  Glucose, capillary     Status: Abnormal   Collection Time: 04/21/17  5:13 PM  Result Value Ref Range   Glucose-Capillary 198 (H) 65 - 99 mg/dL    Dg Chest 1 View  Result Date: 04/21/2017 CLINICAL DATA:  66 year old male with shortness of breath. Altered mental status. Hyponatremia. Recent right pneumothorax, treated with small caliber chest tube placement. EXAM: CHEST 1 VIEW COMPARISON:  0528 hours today, and earlier. FINDINGS: Portable AP upright view at at 1107 hours. Stable right lateral approach chest tube. No pneumothorax. Stable lung volumes and mediastinal contours. Visualized tracheal air column is within normal limits. No pneumothorax. No consolidation identified. No definite pleural effusion. Improved right lung ventilation since 04/18/2017. Mild bilateral increased interstitial markings which are chronic to a degree. No overt pulmonary edema. No areas of worsening ventilation. Chronic left clavicle deformity. IMPRESSION: 1. Stable right chest tube.  No pneumothorax. 2. Improved right lung ventilation since 04/18/2017.  No new cardiopulmonary abnormality. Electronically Signed   By: Odessa Fleming M.D.   On: 04/21/2017 11:24   Dg Chest 1 View  Result Date: 04/21/2017 CLINICAL DATA:  Dyspnea. EXAM: CHEST 1 VIEW COMPARISON:  04/20/2017 FINDINGS: The patient is slightly rotated to the left compared to yesterday's examination. A right-sided chest tube remains in place though has a slightly different configuration than on the prior study. The cardiomediastinal silhouette is unchanged. Bilateral mid and lower lung interstitial type opacities have mildly increased. There are small right and possibly small left pleural effusions. No pneumothorax is identified. A remote nonunited left clavicle fracture is noted. IMPRESSION: 1. Mildly increased bilateral lung opacities which may reflect mild interstitial edema. Small right pleural effusion. 2. No pneumothorax. Electronically Signed   By: Sebastian Ache M.D.   On: 04/21/2017 07:31   Dg Chest 1 View  Result Date: 04/20/2017 CLINICAL DATA:  Dyspnea EXAM: CHEST 1 VIEW COMPARISON:  Chest x-rays dated 04/19/2017, 04/18/2017 and 04/17/2017. FINDINGS: Right-sided chest tube in place, grossly stable in position. No residual pneumothorax seen on today's exam. Continued improvement in aeration bilaterally. No new lung abnormality. Heart size and mediastinal contours appear stable. IMPRESSION: 1. Improved aeration bilaterally, presumably decreased edema and/or airspace collapse. 2. Right-sided chest tube stable in position. No residual pneumothorax seen on today's exam. Electronically Signed   By: Bary Richard M.D.   On: 04/20/2017 07:03    Assessment/Plan:     Principal Problem:   Subacute delirium Active Problems:   Hyponatremia   COPD exacerbation (HCC)   Delirium tremens (HCC)  Patient is improving. Continue with physical therapy.  We'll sign off for now. Please reconsult orthopedics patient has any worsening symptoms in the lower extremities.      Juanell Fairly , MD 04/21/2017,  7:17 PM

## 2017-04-21 NOTE — Progress Notes (Signed)
Nutrition Follow-up  DOCUMENTATION CODES:   Obesity unspecified  INTERVENTION:  Continue Ensure Enlive po BID, each supplement provides 350 kcal and 20 grams of protein.   Continue Magic cup TID with meals, each supplement provides 290 kcal and 9 grams of protein.   Will change MVI to tablet form.  In setting of EtOH abuse, patient will have limited GI absorption of PO thiamine. Recommend supplementing with IV form while patient is admitted and transitioning to PO form at discharge.  NUTRITION DIAGNOSIS:   Inadequate oral intake related to acute illness (etoh abuse, PNA, COPD) as evidenced by meal completion < 25%.  Ongoing inadequate intake even with meal completion >25%.  GOAL:   Patient will meet greater than or equal to 90% of their needs  Progressing.  MONITOR:   PO intake, Supplement acceptance, Labs, Weight trends  REASON FOR ASSESSMENT:   Malnutrition Screening Tool    ASSESSMENT:   66 yo male with hypertension, hypoglycemia, asthma/COPD, gout, seasonal allergies, anxiety, gout and hypothyroidism admitted 06/7 with severe hyponatremia, ETOH withdrawal, and multiple falls  Spoke with patient and family member at bedside. He reports his appetite had been improving but is decreased some again today due to nausea. Reports he is going to ask RN for nausea medication. Drinking some of the Ensure, but not all. Has not tried the [employer] Cup yet. Encouraged intake of the oral nutrition supplements to help prevent unintentional weight loss until appetite returns.  Meal Completion: 25-100% In the past 24 hours patient has had approximately 1435 kcal (61% minimum estimated kcal needs) and 69 grams of protein (58% minimum estimated protein needs).  Medications reviewed and include: allopurinol, folic acid 1 mg daily, Novolog 0-15 units TID, Novolog 0-5 units QHS, levothyroxine, Medline mouth rinse, methylprednisolone 40 mg Q12hrs, liquid MVI daily, thiamine 100 mg daily.  Labs  reviewed: CBG 161-198, Sodium 133, Chloride 95.   No subsequent weight from admission to trend.  Discussed with RN.   Diet Order:  DIET SOFT Room service appropriate? Yes; Fluid consistency: Thin  Skin:  Wound (see comment) (closed incision chest)  Last BM:  04/17/2017  Height:   Ht Readings from Last 1 Encounters:  04/14/17 6\' 1"  (1.854 m)    Weight:   Wt Readings from Last 1 Encounters:  04/14/17 263 lb 0.1 oz (119.3 kg)    Ideal Body Weight:  83.63 kg  BMI:  Body mass index is 34.7 kg/m.  Estimated Nutritional Needs:   Kcal:  2350-2650kcal/day   Protein:  120-145 grams  Fluid:  771ml/kcal/day   EDUCATION NEEDS:   Education needs no appropriate at this time  Helane RimaLeanne Jenilyn Magana, MS, RD, LDN Pager: [phone] After Hours Pager: [phone]

## 2017-04-21 NOTE — Clinical Social Work Note (Signed)
CSW attempted to follow up with patient to see what his decision is regarding rehab. Upon attempting to enter patient's room and knocking, CSW was greeted by a woman who asked CSW to come back due to patient and patient's son were having a deep personal conversation. CSW will attempt to return. York SpanielMonica Kelle Ruppert MSW,LCSW [phone]

## 2017-04-21 NOTE — Care Management (Signed)
Late Entry:  Patient admitted for acute hypoxic respiratory failure.  RNCM met with patient and son to complete assessment 04/20/17.  Patient lives at home with adult son.  Patient has history of alcohol abuse.  Patient currently with chest tube in place to suction due to pneumothorax.  Patient requiring O2 at 6L [place] to maintain saturations.  Patient states that he does not have O2 at home.  PCP [facility].  Pharmacy [facility].  Patient has a RW and BSC in the home.  However prior to admission patient states that he was independent.  PT has assessed patient and recommends SNF.  Patient is not sure at this time if he will be agreeable to SNF, or if he will return home.  Son expresses concern that he will need around the clock care in the home if his father returns in his current conditions.  RNCM explained that insurance would cover services such as RN, PT, aide, and SW, however round the clock care would be PCS services and be an out of pocket cost.  At this time patient is requiring acute oxygen.  Upon chart review it does not appear that patient has a chronic respiratory diagnosis to qualify for home O2.  Patient and son notified that if patient does not qualify for O2, is still requiring O2 at discharge, and returns home this will also be an out of pocket expense. PT to evaluated now that patient has transitioned to floor care.  Patient and family to discuss discharge disposition.  RNCM following

## 2017-04-21 NOTE — Progress Notes (Signed)
Physical Therapy Treatment Patient Details Name: Arthur Conner MRN: [id number] DOB: [birth year] Today's Date: 04/21/2017    History of Present Illness 66 year old male with past medical history significant for depression and anxiety, hypertension, alcohol abuse presents to the hospital secondary to altered mental status and falls and noted to have hyponatremia.    PT Comments    Pt agreeable to PT; reports nauseating pain due to chest tube removal. Pt requires encouragement for participation, but agreeable to edge of bed sitting for exercises. Pt has some difficulty maintaining neutral upright posture without cues for correction. Forward slouch position and occasional posterolateral lean/slow fall to the R; pt denies knowledge that this is occurring. Pt offered sit to/from stand position at the edge of bed, but pt feels unable to at this time due to increased nausea post seated exercise. Pt returned to bed. Encouraged edge of bed sitting later this evening with staff if pt up to it. Continue PT to progress endurance, strength, seated balance to improve functional mobility.    Follow Up Recommendations  SNF     Equipment Recommendations       Recommendations for Other Services       Precautions / Restrictions Precautions Precautions: Fall Restrictions Weight Bearing Restrictions: No    Mobility  Bed Mobility Overal bed mobility: Modified Independent Bed Mobility: Supine to Sit;Sit to Supine     Supine to sit: Modified independent (Device/Increase time);HOB elevated Sit to supine: Modified independent (Device/Increase time)   General bed mobility comments: Increased time/effort  Transfers                 General transfer comment: Offered; pt refuses at this time due to increased nausea post sitting exercises  Ambulation/Gait                 Stairs            Wheelchair Mobility    Modified Rankin (Stroke Patients Only)       Balance Overall  balance assessment: History of Falls Sitting-balance support: No upper extremity supported;Feet supported Sitting balance-Leahy Scale: Fair (Fair -) Sitting balance - Comments: posterolateral R lean. severe forward slouched position requiring frequent cues for neutral posture                                    Cognition Arousal/Alertness: Awake/alert Behavior During Therapy: WFL for tasks assessed/performed Overall Cognitive Status: Within Functional Limits for tasks assessed                                        Exercises General Exercises - Lower Extremity Long Arc Quad: AROM;Both;10 reps;Seated Hip ABduction/ADduction: AROM;Both;10 reps;Seated (90/90 leg position) Hip Flexion/Marching: AROM;Both;10 reps;Seated (2 sets) Toe Raises: AROM;Both;20 reps;Seated Heel Raises: AROM;Both;20 reps;Seated    General Comments General comments (skin integrity, edema, etc.): bruising/scabs BLEs L > R      Pertinent Vitals/Pain Pain Assessment: Faces Faces Pain Scale: Hurts even more Pain Location: Site of chest tube removal Pain Descriptors / Indicators:  (nauseating) Pain Intervention(s): Limited activity within patient's tolerance;Monitored during session    Home Living                      Prior Function            PT Goals (current  goals can now be found in the care plan section) Progress towards PT goals: Progressing toward goals    Frequency    Min 2X/week      PT Plan Current plan remains appropriate    Co-evaluation              AM-PAC PT "6 Clicks" Daily Activity  Outcome Measure  Difficulty turning over in bed (including adjusting bedclothes, sheets and blankets)?: A Little Difficulty moving from lying on back to sitting on the side of the bed? : A Little Difficulty sitting down on and standing up from a chair with arms (e.g., wheelchair, bedside commode, etc,.)?: Total Help needed moving to and from a bed to chair  (including a wheelchair)?: A Lot Help needed walking in hospital room?: A Lot Help needed climbing 3-5 steps with a railing? : Total 6 Click Score: 12    End of Session   Activity Tolerance: Patient limited by fatigue;Other (comment) (nausea) Patient left: in bed;with call bell/phone within reach;with family/visitor present;with nursing/sitter in room   PT Visit Diagnosis: Muscle weakness (generalized) (M62.81);Difficulty in walking, not elsewhere classified (R26.2)     Time: [id number] PT Time Calculation (min) (ACUTE ONLY): 18 min  Charges:  $Therapeutic Exercise: 8-22 mins                    G Codes:        Scot DockHeidi E Anayla Giannetti, PTA 04/21/2017, 4:09 PM

## 2017-04-21 NOTE — Progress Notes (Signed)
[facility]     Assessment and Plan:  Acute hypoxic respiratory failure secondary to pneumonia  S/P right chest tube due to right pneumothorax likely secondary to trauma. I personally reviewed repeat CXR images, ptx appears resolved after clamping of chest tube. The chest tube was removed without complications, bandage was placed, which can be changed to band-aid in 24 hours. No further follow up is needed in regards to pneumothorax.   -continue antibiotics to Complete course. -Wean down oxygen, and advance activity as tolerated. -Discussed plan with family at bedside.  Pulmonary service will sign off for now, please call if there are any further questions.   Date: 04/21/2017  MRN# [id number] Arthur Conner Jan 14, 1951   Arthur Conner is a 66 y.o. old male seen in follow up for chief complaint of  Chief Complaint  Patient presents with  . Weakness  . Nausea     HPI:   Pt looking and feeling better. No new complaints today.   Medication:    Current Facility-Administered Medications:  .  acetaminophen (TYLENOL) tablet 650 mg, 650 mg, Oral, Q6H PRN **OR** acetaminophen (TYLENOL) suppository 650 mg, 650 mg, Rectal, Q6H PRN, Gouru, Aruna, MD .  albuterol (PROVENTIL) (2.5 MG/3ML) 0.083% nebulizer solution 2.5 mg, 2.5 mg, Nebulization, Q3H PRN, Merwyn Katos, MD, 2.5 mg at 04/19/17 1057 .  allopurinol (ZYLOPRIM) tablet 300 mg, 300 mg, Oral, Daily, Sherryll Burger, Vipul, MD .  budesonide (PULMICORT) nebulizer solution 0.25 mg, 0.25 mg, Nebulization, Q6H, Merwyn Katos, MD, 0.25 mg at 04/21/17 0749 .  doxycycline (VIBRA-TABS) tablet 100 mg, 100 mg, Oral, Q12H, Shane Crutch, MD, 100 mg at 04/21/17 0853 .  enoxaparin (LOVENOX) injection 40 mg, 40 mg, Subcutaneous, Q24H, Gouru, Aruna, MD, 40 mg at 04/20/17 2241 .  feeding supplement (ENSURE ENLIVE) (ENSURE ENLIVE) liquid 237 mL, 237 mL, Oral, BID BM, Enedina Finner, MD, 237 mL at 04/20/17 1350 .  fentaNYL  (SUBLIMAZE) injection 25-100 mcg, 25-100 mcg, Intravenous, Q2H PRN, Shane Crutch, MD, 50 mcg at 04/18/17 0726 .  fluticasone (FLONASE) 50 MCG/ACT nasal spray 2 spray, 2 spray, Each Nare, Daily, Eugenie Norrie, NP, 2 spray at 04/21/17 1136 .  folic acid (FOLVITE) tablet 1 mg, 1 mg, Oral, Daily, Shane Crutch, MD, 1 mg at 04/21/17 0853 .  insulin aspart (novoLOG) injection 0-15 Units, 0-15 Units, Subcutaneous, TID WC, Shane Crutch, MD, 3 Units at 04/21/17 0853 .  insulin aspart (novoLOG) injection 0-5 Units, 0-5 Units, Subcutaneous, QHS, Imagine Nest, MD .  ipratropium-albuterol (DUONEB) 0.5-2.5 (3) MG/3ML nebulizer solution 3 mL, 3 mL, Nebulization, Q6H, Merwyn Katos, MD, 3 mL at 04/21/17 0749 .  levothyroxine (SYNTHROID, LEVOTHROID) tablet 100 mcg, 100 mcg, Oral, QAC breakfast, Delfino Lovett, MD, 100 mcg at 04/21/17 0853 .  lip balm (BLISTEX) ointment, , Topical, PRN, Shane Crutch, MD .  loratadine (CLARITIN) tablet 10 mg, 10 mg, Oral, Daily, Sherryll Burger, Vipul, MD, 10 mg at 04/21/17 1136 .  LORazepam (ATIVAN) injection 0.5-1 mg, 0.5-1 mg, Intravenous, Q4H PRN, Merwyn Katos, MD, 0.5 mg at 04/19/17 1105 .  MEDLINE mouth rinse, 15 mL, Mouth Rinse, BID, Kalisetti, Radhika, MD, 15 mL at 04/20/17 2242 .  methylPREDNISolone sodium succinate (SOLU-MEDROL) 40 mg/mL injection 40 mg, 40 mg, Intravenous, Q12H, Blakeney, Dana G, NP, 40 mg at 04/21/17 1136 .  multivitamin liquid 15 mL, 15 mL, Oral, Daily, Enedina Finner, MD, 15 mL at 04/21/17 1136 .  [DISCONTINUED] ondansetron (ZOFRAN) tablet 4 mg, 4 mg, Oral, Q6H  PRN **OR** ondansetron (ZOFRAN) injection 4 mg, 4 mg, Intravenous, Q6H PRN, Gouru, Aruna, MD, 4 mg at 04/14/17 1930 .  oxyCODONE (Oxy IR/ROXICODONE) immediate release tablet 5 mg, 5 mg, Oral, Q6H PRN, Delfino Lovett, MD, 5 mg at 04/20/17 1725 .  thiamine (VITAMIN B-1) tablet 100 mg, 100 mg, Oral, Daily, Shane Crutch, MD, 100 mg at 04/21/17  1610   Allergies:  Toprol xl [metoprolol tartrate]  Review of Systems: Gen:  Denies  fever, sweats. HEENT: Denies blurred vision. Cvc:  No dizziness, chest pain or heaviness Resp:   Denies cough or sputum porduction. Gi: Denies swallowing difficulty, stomach pain. constipation, bowel incontinence Gu:  Denies bladder incontinence, burning urine Ext:   No Joint pain, stiffness. Skin: No skin rash, easy bruising. Endoc:  No polyuria, polydipsia. Psych: No depression, insomnia. Other:  All other systems were reviewed and found to be negative other than what is mentioned in the HPI.   Physical Examination:   VS: BP 139/88 (BP Location: Left Arm)   Pulse 96   Temp 98.6 F (37 C)   Resp 20   Ht 6\' 1"  (1.854 m)   Wt 119.3 kg (263 lb 0.1 oz)   SpO2 93%   BMI 34.70 kg/m    General Appearance: No distress  Neuro:without focal findings,  speech normal,  HEENT: PERRLA, EOM intact. Pulmonary: normal breath sounds, No wheezing.   CardiovascularNormal S1,S2.  No m/r/g.   Abdomen: Benign, Soft, non-tender. Renal:  No costovertebral tenderness  GU:  Not performed at this time. Endoc: No evident thyromegaly, no signs of acromegaly. Skin:   warm, no rash. Extremities: normal, no cyanosis, clubbing.   LABORATORY PANEL:   CBC  Recent Labs Lab 04/21/17 0506  WBC 10.1  HGB 12.0*  HCT 34.4*  PLT 230   ------------------------------------------------------------------------------------------------------------------  Chemistries   Recent Labs Lab 04/19/17 0524 04/21/17 0506  NA 134* 133*  K 4.1 4.6  CL 97* 95*  CO2 28 30  GLUCOSE 181* 168*  BUN 18 20  CREATININE 0.76 0.61  CALCIUM 8.7* 8.7*  MG 2.2 1.8  AST 21  --   ALT 44  --   ALKPHOS 46  --   BILITOT 0.9  --    ------------------------------------------------------------------------------------------------------------------  Cardiac Enzymes No results for input(s): TROPONINI in the last 168  hours. ------------------------------------------------------------  RADIOLOGY:    Results for orders placed in visit on 11/17/11  DG Chest 2 View   Narrative * PRIOR REPORT IMPORTED FROM AN EXTERNAL SYSTEM *   PRIOR REPORT IMPORTED FROM THE [employer] WORKFLOW SYSTEM   REASON FOR EXAM:    chronic cough  COMMENTS:   PROCEDURE:     KDR - KDXR CHEST PA (OR AP) AND LAT  - Nov 17 2011  4:18PM   RESULT:     Comparison is made to the prior exam of 04/29/2011. The lung  fields are clear. Heart size is normal. There is deformity of the fourth  left posterior rib that likely is due to an old fracture that has healed.   IMPRESSION:  1.     No acute changes are identified.   Thank you for the opportunity to contribute to the care of your patient.       ------------------------------------------------------------------------------------------------------------------  Thank  you for allowing [facility] to assist in the care of your patient. Our recommendations are noted above.  Please contact us if we can be of further service.   Wells Guiles, MD.   Pulmonary and Critical Care Office Number: [phone]  Santiago Gladavid Kasa, M.D.  Billy Fischeravid Simonds, M.D  04/21/2017

## 2017-04-21 NOTE — Consult Note (Signed)
[facility] Face-to-Face Psychiatry Consult   Reason for Consult:  Consult follow-up with this 66 year old man with a history of alcohol abuse who is in the hospital recovering from probably delirium tremens as well as hyponatremia related to alcohol abuse Referring Physician:  Posey Pronto Patient Identification: Arthur Conner MRN:  [id number] Principal Diagnosis: Subacute delirium Diagnosis:   Patient Active Problem List   Diagnosis Date Noted  . Delirium tremens ([place]) [F10.231] 04/19/2017  . Subacute delirium [F05] 04/19/2017  . COPD exacerbation ([place]) [J44.1] 04/18/2017  . Hyponatremia [E87.1] 04/14/2017  . Asthmatic bronchitis [J45.909] 10/31/2015  . Hypothyroidism [E03.9] 08/12/2015  . Asthma exacerbation [J45.901] 07/29/2015  . Essential hypertension [I10] 04/24/2015    Total Time spent with patient: 20 minutes  Subjective:   Arthur Conner is a 66 y.o. male patient admitted with patient was not able to communicate.  This is a follow-up note for Wednesday the 13th. Patient seen. Chart reviewed. He seems significantly better from a mental status standpoint today. He was able to converse although his speech is slow. He says he is feeling like he is coming back to normal. He was fully oriented to his situation and the date although he admits he has no memory of coming into the hospital. Denies having any visual hallucinations today. He is still a little bit shaky. He said however he was able to get out out of bed and did not feel dizzy.  Follow-up for Thursday the 14th. Patient seen today. He had his chest tube taken out and he is feeling sore and having a lot of aching today. Mentally he seems to be more alert than he was yesterday. He has a pretty good grip on what's going on and understands the plan for rehabilitation. He actually expressed to me that he is glad that he will be going to rehabilitation. He understands that he is not currently able to walk or take care of himself. Not  depressed not suicidal not psychotic.  HPI:  Patient seen and I attempted to communicate with him a little bit. Chart reviewed. Spoke to the patient's sister. They have asked me to call his son and I will try to follow-up with that as well. 66 year old man brought into the hospital with altered mental status found to be extremely hyponatremic. Has been delirious with altered mental status and uncommunicative. Presumably from profound hyponatremia as well as from delirium tremens. Patient was in the intensive care unit on [facility] for a period of time but now has moved out of the intensive care unit. On interview today I found him to be eyes open alert and interactive in that he was able to nod his head and follow a couple of one-step commands but was not able to speak or express himself in any more complex way.  Patient evidently has been living with his adult son for a long time but the patient's drinking and failure to care for himself have been getting worse and worse with time. Family seem to be split in how close they are to the patient.  Medical history: History of high blood pressure and asthma and acute hyponatremia which is gradually improving  Substance abuse history: Long history of heavy alcohol abuse  Past Psychiatric History: Unknown past psychiatric history other than long-standing alcohol abuse. Not clear whether he's ever had DTs in the past.  Risk to Self: Is patient at risk for suicide?: No Risk to Others:   Prior Inpatient Therapy:   Prior Outpatient Therapy:  Past Medical History:  Past Medical History:  Diagnosis Date  . Anxiety   . Asthma   . Depression   . Gout   . Hypertension     Past Surgical History:  Procedure Laterality Date  . APPENDECTOMY    . TONSILLECTOMY     Family History:  Family History  Problem Relation Age of Onset  . Lung cancer Father   . Aneurysm Mother    Family Psychiatric  History: Unknown Social History:  History  Alcohol Use   . 0.0 oz/week    Comment: "I'm not even sure how much I drink"     History  Drug Use No    Social History   Social History  . Marital status: Married    Spouse name: N/A  . Number of children: 2  . Years of education: N/A   Occupational History  . OWNER Self Employed   Social History Main Topics  . Smoking status: Current Every Day Smoker    Packs/day: 0.50    Types: Cigarettes    Last attempt to quit: 08/11/1990  . Smokeless tobacco: Never Used  . Alcohol use 0.0 oz/week     Comment: "I'm not even sure how much I drink"  . Drug use: No  . Sexual activity: Not Asked   Other Topics Concern  . None   Social History Narrative  . None   Additional Social History:    Allergies:   Allergies  Allergen Reactions  . Toprol Xl [Metoprolol Tartrate] Other (See Comments)    Bradycardia    Labs:  Results for orders placed or performed during the hospital encounter of 04/14/17 (from the past 48 hour(s))  Glucose, capillary     Status: Abnormal   Collection Time: 04/19/17  9:41 PM  Result Value Ref Range   Glucose-Capillary 167 (H) 65 - 99 mg/dL   Comment 1 Notify RN   Glucose, capillary     Status: Abnormal   Collection Time: 04/20/17  7:45 AM  Result Value Ref Range   Glucose-Capillary 190 (H) 65 - 99 mg/dL   Comment 1 Notify RN   Glucose, capillary     Status: Abnormal   Collection Time: 04/20/17 11:33 AM  Result Value Ref Range   Glucose-Capillary 207 (H) 65 - 99 mg/dL   Comment 1 Notify RN   Glucose, capillary     Status: Abnormal   Collection Time: 04/20/17  4:48 PM  Result Value Ref Range   Glucose-Capillary 171 (H) 65 - 99 mg/dL   Comment 1 Notify RN   Glucose, capillary     Status: Abnormal   Collection Time: 04/20/17  9:46 PM  Result Value Ref Range   Glucose-Capillary 161 (H) 65 - 99 mg/dL   Comment 1 Notify RN   Procalcitonin     Status: None   Collection Time: 04/21/17  5:06 AM  Result Value Ref Range   Procalcitonin <0.10 ng/mL    Comment:         Interpretation: PCT (Procalcitonin) <= 0.5 ng/mL: Systemic infection (sepsis) is not likely. Local bacterial infection is possible. (NOTE)         ICU PCT Algorithm               Non ICU PCT Algorithm    ----------------------------     ------------------------------         PCT < 0.25 ng/mL  PCT < 0.1 ng/mL     Stopping of antibiotics            Stopping of antibiotics       strongly encouraged.               strongly encouraged.    ----------------------------     ------------------------------       PCT level decrease by               PCT < 0.25 ng/mL       >= 80% from peak PCT       OR PCT 0.25 - 0.5 ng/mL          Stopping of antibiotics                                             encouraged.     Stopping of antibiotics           encouraged.    ----------------------------     ------------------------------       PCT level decrease by              PCT >= 0.25 ng/mL       < 80% from peak PCT        AND PCT >= 0.5 ng/mL            Continuin g antibiotics                                              encouraged.       Continuing antibiotics            encouraged.    ----------------------------     ------------------------------     PCT level increase compared          PCT > 0.5 ng/mL         with peak PCT AND          PCT >= 0.5 ng/mL             Escalation of antibiotics                                          strongly encouraged.      Escalation of antibiotics        strongly encouraged.   Basic metabolic panel     Status: Abnormal   Collection Time: 04/21/17  5:06 AM  Result Value Ref Range   Sodium 133 (L) 135 - 145 mmol/L   Potassium 4.6 3.5 - 5.1 mmol/L   Chloride 95 (L) 101 - 111 mmol/L   CO2 30 22 - 32 mmol/L   Glucose, Bld 168 (H) 65 - 99 mg/dL   BUN 20 6 - 20 mg/dL   Creatinine, Ser 0.61 0.61 - 1.24 mg/dL   Calcium 8.7 (L) 8.9 - 10.3 mg/dL   GFR calc non Af Amer >60 >60 mL/min   GFR calc Af Amer >60 >60 mL/min    Comment: (NOTE) The eGFR  has been calculated using the CKD EPI equation. This calculation has not been validated in all clinical situations. eGFR's persistently <60 mL/min signify possible Chronic Kidney  Disease.    Anion gap 8 5 - 15  Magnesium     Status: None   Collection Time: 04/21/17  5:06 AM  Result Value Ref Range   Magnesium 1.8 1.7 - 2.4 mg/dL  Phosphorus     Status: None   Collection Time: 04/21/17  5:06 AM  Result Value Ref Range   Phosphorus 4.6 2.5 - 4.6 mg/dL  CBC     Status: Abnormal   Collection Time: 04/21/17  5:06 AM  Result Value Ref Range   WBC 10.1 3.8 - 10.6 K/uL   RBC 3.52 (L) 4.40 - 5.90 MIL/uL   Hemoglobin 12.0 (L) 13.0 - 18.0 g/dL   HCT 34.4 (L) 40.0 - 52.0 %   MCV 97.6 80.0 - 100.0 fL   MCH 34.2 (H) 26.0 - 34.0 pg   MCHC 35.0 32.0 - 36.0 g/dL   RDW 15.0 (H) 11.5 - 14.5 %   Platelets 230 150 - 440 K/uL  Glucose, capillary     Status: Abnormal   Collection Time: 04/21/17  7:36 AM  Result Value Ref Range   Glucose-Capillary 178 (H) 65 - 99 mg/dL  Glucose, capillary     Status: Abnormal   Collection Time: 04/21/17 11:44 AM  Result Value Ref Range   Glucose-Capillary 175 (H) 65 - 99 mg/dL  Glucose, capillary     Status: Abnormal   Collection Time: 04/21/17  5:13 PM  Result Value Ref Range   Glucose-Capillary 198 (H) 65 - 99 mg/dL    Current Facility-Administered Medications  Medication Dose Route Frequency Provider Last Rate Last Dose  . acetaminophen (TYLENOL) tablet 650 mg  650 mg Oral Q6H PRN Gouru, Aruna, MD       Or  . acetaminophen (TYLENOL) suppository 650 mg  650 mg Rectal Q6H PRN Gouru, Aruna, MD      . albuterol (PROVENTIL) (2.5 MG/3ML) 0.083% nebulizer solution 2.5 mg  2.5 mg Nebulization Q3H PRN Wilhelmina Mcardle, MD   2.5 mg at 04/19/17 1057  . allopurinol (ZYLOPRIM) tablet 300 mg  300 mg Oral Daily Max Sane, MD   300 mg at 04/21/17 1204  . budesonide (PULMICORT) nebulizer solution 0.25 mg  0.25 mg Nebulization Q6H Wilhelmina Mcardle, MD   0.25 mg at 04/21/17  2008  . doxycycline (VIBRA-TABS) tablet 100 mg  100 mg Oral Q12H Laverle Hobby, MD   100 mg at 04/21/17 0853  . enoxaparin (LOVENOX) injection 40 mg  40 mg Subcutaneous Q24H Gouru, Aruna, MD   40 mg at 04/20/17 2241  . feeding supplement (ENSURE ENLIVE) (ENSURE ENLIVE) liquid 237 mL  237 mL Oral BID BM Fritzi Mandes, MD   237 mL at 04/21/17 1733  . fentaNYL (SUBLIMAZE) injection 25-100 mcg  25-100 mcg Intravenous Q2H PRN Laverle Hobby, MD   50 mcg at 04/18/17 0726  . fluticasone (FLONASE) 50 MCG/ACT nasal spray 2 spray  2 spray Each Nare Daily Awilda Bill, NP   2 spray at 04/21/17 1136  . folic acid (FOLVITE) tablet 1 mg  1 mg Oral Daily Laverle Hobby, MD   1 mg at 04/21/17 0853  . insulin aspart (novoLOG) injection 0-15 Units  0-15 Units Subcutaneous TID WC Laverle Hobby, MD   3 Units at 04/21/17 1733  . insulin aspart (novoLOG) injection 0-5 Units  0-5 Units Subcutaneous QHS Laverle Hobby, MD      . ipratropium-albuterol (DUONEB) 0.5-2.5 (3) MG/3ML nebulizer solution 3 mL  3 mL Nebulization Q6H Wilhelmina Mcardle, MD  3 mL at 04/21/17 2008  . levothyroxine (SYNTHROID, LEVOTHROID) tablet 100 mcg  100 mcg Oral QAC breakfast Delfino Lovett, MD   100 mcg at 04/21/17 0853  . lip balm (BLISTEX) ointment   Topical PRN Shane Crutch, MD      . loratadine (CLARITIN) tablet 10 mg  10 mg Oral Daily Delfino Lovett, MD   10 mg at 04/21/17 1136  . LORazepam (ATIVAN) injection 0.5-1 mg  0.5-1 mg Intravenous Q4H PRN Merwyn Katos, MD   0.5 mg at 04/19/17 1105  . MEDLINE mouth rinse  15 mL Mouth Rinse BID Enid Baas, MD   15 mL at 04/20/17 2242  . methylPREDNISolone sodium succinate (SOLU-MEDROL) 40 mg/mL injection 40 mg  40 mg Intravenous Q12H Eugenie Norrie, NP   40 mg at 04/21/17 1136  . [START ON 04/22/2017] multivitamin with minerals tablet 1 tablet  1 tablet Oral Daily Sherryll Burger, Vipul, MD      . ondansetron (ZOFRAN) injection 4 mg  4 mg Intravenous Q6H PRN  Gouru, Aruna, MD   4 mg at 04/14/17 1930  . oxyCODONE (Oxy IR/ROXICODONE) immediate release tablet 5 mg  5 mg Oral Q6H PRN Delfino Lovett, MD   5 mg at 04/20/17 1725  . thiamine (VITAMIN B-1) tablet 100 mg  100 mg Oral Daily Shane Crutch, MD   100 mg at 04/21/17 2478    Musculoskeletal: Strength & Muscle Tone: decreased Gait & Station: unable to stand Patient leans: N/A  Psychiatric Specialty Exam: Physical Exam  Nursing note and vitals reviewed. Constitutional: He appears well-developed and well-nourished.  HENT:  Head: Normocephalic and atraumatic.  Eyes: Conjunctivae are normal. Pupils are equal, round, and reactive to light.  Neck: Normal range of motion.  Cardiovascular: Normal heart sounds.   GI: Soft.  Musculoskeletal: Normal range of motion.  Neurological: He is alert.  Skin: Skin is warm and dry.  Psychiatric: Judgment normal. His affect is blunt. His speech is delayed. He is slowed. Thought content is not paranoid. He expresses no homicidal and no suicidal ideation. He is communicative. He exhibits abnormal recent memory.    Review of Systems  Psychiatric/Behavioral: Positive for memory loss. Negative for depression, hallucinations, substance abuse and suicidal ideas. The patient is not nervous/anxious and does not have insomnia.     Blood pressure 135/83, pulse (!) 106, temperature 98.2 F (36.8 C), temperature source Oral, resp. rate 19, height 6\' 1"  (1.854 m), weight 119.3 kg (263 lb 0.1 oz), SpO2 95 %.Body mass index is 34.7 kg/m.  General Appearance: Disheveled  Eye Contact:  Fair  Speech:  Negative  Volume:  Decreased  Mood:  Negative  Affect:  Blunt  Thought Process:  NA  Orientation:  Negative  Thought Content:  Negative  Suicidal Thoughts:  No  Homicidal Thoughts:  No  Memory:  Negative  Judgement:  Negative  Insight:  Negative  Psychomotor Activity:  Negative  Concentration:  Concentration: Negative  Recall:  NA  Fund of Knowledge:  Negative   Language:  Negative  Akathisia:  Negative  Handed:  Right  AIMS (if indicated):     Assets:  Housing Social Support  ADL's:  Impaired  Cognition:  Impaired,  Moderate  Sleep:        Treatment Plan Summary: Daily contact with patient to assess and evaluate symptoms and progress in treatment, Medication management and Plan Patient is continuing to recover from extended alcohol withdrawal and in patient care in the intensive care unit. He is lucid  today not having hallucinations. Still slow and sluggish. No suicidal thoughts. Supportive counseling completed. No change to medicine. I'm glad to hear that he is positive about going to rehabilitation. Definitely reinforced that is a good plan to him.  Disposition: Patient does not meet criteria for psychiatric inpatient admission. Supportive therapy provided about ongoing stressors.  Alethia Berthold, MD 04/21/2017 9:17 PM

## 2017-04-21 NOTE — Clinical Social Work Note (Signed)
CSW spoke with patient this afternoon and he has decided that he would be willing for CSW to send out his referral to find a rehab bed for PT. Bed search initiated. York SpanielMonica Stewart Pimenta MSW,LCSW [phone]

## 2017-04-22 ENCOUNTER — Telehealth: Payer: Self-pay | Admitting: Pulmonary Disease

## 2017-04-22 ENCOUNTER — Other Ambulatory Visit: Payer: Self-pay | Admitting: Family Medicine

## 2017-04-22 LAB — GLUCOSE, CAPILLARY: GLUCOSE-CAPILLARY: 184 mg/dL — AB (ref 65–99)

## 2017-04-22 MED ORDER — DOXYCYCLINE HYCLATE 100 MG PO TABS: 100.0000 mg | ORAL_TABLET | Freq: Two times a day (BID) | ORAL | 0 refills | Status: DC

## 2017-04-22 MED ORDER — ENSURE ENLIVE PO LIQD: 237.0000 mL | Freq: Two times a day (BID) | ORAL | 12 refills | Status: AC

## 2017-04-22 NOTE — Progress Notes (Signed)
While rounding unit [place] visit Pt yesterday afternoon and met with pt, pt's sister, son and niece who were at the bedside. Pt's sister talked about her brother's health struggles, her daughter's mission involvement and hope for the brother's recovery. Pt stated he was having chest pains at the time, but he was talking and responsive. Ch made a follow up visit with pt again this morning and saw pt and his son. Pt appeared to be doing better this morning and will be moved to a rehab facility today. Pt was in good spirit this morning, smiled more frequently than he did yesterday. Pt thanked Ch for visiting with him.

## 2017-04-22 NOTE — Telephone Encounter (Signed)
Pt needs 2 day hosp f/u. Please advise.

## 2017-04-22 NOTE — Clinical Social Work Note (Signed)
Patient to discharge today. Bed offers extended. Patient and son have now requested [facility]. CSW faxed referral to [facility] and called Clydie BraunKaren, their admission's coordinator. Clydie BraunKaren offered a bed. Patient and son have accepted. Patient to transport via family. Nurse to call report. Discharge information sent. York SpanielMonica Ayden Hardwick MSW,LCSW [phone]

## 2017-04-22 NOTE — Progress Notes (Signed)
04/22/2017  11:30  Arthur GawJoseph L Conner to be D/C'd Rehab per MD order.  Discussed prescriptions and follow up appointments with the patient. Pt not receptive to teaching, anxious to leave, did not want to discuss.  Family similar.  Printed AVS given to pt and sent with pt.  Allergies as of 04/22/2017      Reactions   Toprol Xl [metoprolol Tartrate] Other (See Comments)   Bradycardia      Medication List    STOP taking these medications   triamcinolone cream 0.1 % Commonly known as:  KENALOG   umeclidinium-vilanterol 62.5-25 MCG/INH Aepb Commonly known as:  ANORO ELLIPTA     TAKE these medications   albuterol (2.5 MG/3ML) 0.083% nebulizer solution Commonly known as:  PROVENTIL INHALE CONTENTS OF 1 VIAL BY MOUTH THROUGH NEBULIZER EVERY 4 TO 6 HOURS AS NEEDED FOR SHORTNESS OF BREATH.   PROVENTIL HFA 108 (90 Base) MCG/ACT inhaler Generic drug:  albuterol INHALE 2 PUFFS BY MOUTH EVERY 6 HOURS AS NEEDED FOR WHEEZING OR SHORTNESS OF BREATH.   allopurinol 300 MG tablet Commonly known as:  ZYLOPRIM TAKE 1 TABLET BY MOUTH ONCE DAILY   amLODipine 10 MG tablet Commonly known as:  NORVASC TAKE 1 TABLET BY MOUTH ONCE DAILY   budesonide-formoterol 160-4.5 MCG/ACT inhaler Commonly known as:  SYMBICORT Inhale 2 puffs into the lungs 2 (two) times daily.   cetirizine 10 MG tablet Commonly known as:  ZYRTEC once daily.   doxycycline 100 MG tablet Commonly known as:  VIBRA-TABS Take 1 tablet (100 mg total) by mouth every 12 (twelve) hours.   feeding supplement (ENSURE ENLIVE) Liqd Take 237 mLs by mouth 2 (two) times daily between meals.   hydrochlorothiazide 25 MG tablet Commonly known as:  HYDRODIURIL Take 1 tablet (25 mg total) by mouth daily.   levothyroxine 100 MCG tablet Commonly known as:  SYNTHROID, LEVOTHROID TAKE 1 TABLET BY MOUTH ONCE DAILY ON AN EMPTY STOMACH. WAIT 30 MINUTES BEFORE TAKING OTHER MEDS.   losartan 100 MG tablet Commonly known as:  COZAAR Take 1 tablet  (100 mg total) by mouth daily.   valsartan 320 MG tablet Commonly known as:  DIOVAN Take 320 mg by mouth daily.       Vitals:   04/21/17 2154 04/22/17 0504  BP: 138/78 (!) 150/83  Pulse: 83 99  Resp: 16 16  Temp: 98.2 F (36.8 C) 98.1 F (36.7 C)    Skin clean, dry and intact without evidence of skin break down, no evidence of skin tears noted. IV catheter discontinued intact. Site without signs and symptoms of complications. Dressing and pressure applied. Pt denies pain at this time. No complaints noted.  An After Visit Summary was printed and given to the patient. Patient escorted via WC, and D/C home via private auto.  Arthur Conner, Arthur Conner E

## 2017-04-22 NOTE — Telephone Encounter (Signed)
[facility]

## 2017-04-22 NOTE — Telephone Encounter (Signed)
Please advise on how soon this patient needs to be seen.

## 2017-04-22 NOTE — Telephone Encounter (Deleted)
  Last routine OV: 10/27/15 Next OV: None on file.

## 2017-04-22 NOTE — Discharge Instructions (Signed)

## 2017-04-22 NOTE — Care Management (Signed)
Patient to discharge to SNF, CSW facilitating. RNCM signing off

## 2017-04-22 NOTE — Discharge Summary (Signed)
[facility] [facility] [place] [facility]   PATIENT NAME: Arthur Conner    MR#:  [id number]  DATE OF BIRTH:  [birth year]  DATE OF ADMISSION:  04/14/2017   ADMITTING PHYSICIAN: Ramonita Lab, MD  DATE OF DISCHARGE: 04/22/2017   PRIMARY CARE PHYSICIAN: Steele Sizer, MD   ADMISSION DIAGNOSIS:  Hyponatremia [E87.1] Alcohol dependence with withdrawal with complication (HCC) [F10.239] DISCHARGE DIAGNOSIS:  Principal Problem:   Subacute delirium Active Problems:   Hyponatremia   COPD exacerbation (HCC)   Delirium tremens (HCC)  SECONDARY DIAGNOSIS:   Past Medical History:  Diagnosis Date  . Anxiety   . Asthma   . Depression   . Gout   . Hypertension    HOSPITAL COURSE:  66 year old male with past medical history significant for depression and anxiety, hypertension, alcohol abuse presents to the hospital secondary to altered mental status and falls and noted to have hyponatremia.  #1 Hyponatremia-likely acute, beer potomania and also poor oral intake. -improved. Na 133 on 6/14  #2 Altered mental status-metabolic encephalopathy secondary to hyponatremia and DTs. -back to baseline. Avoid narcotics if possible.  #3 alcohol abuse with possible alcoholic liver disease-counseled  #4 fall-secondary to intoxication. X-rays revealed either subacute or old right tibial plateau fractures. - Orthopedics f/up as an outpt. Physical therapy recommends STR/SNF where he's being discharged. - MRI showing it to be remote fracture  #5 Dysphagia- improved. Tolerating diet ok. If continues to have issue, consider outpt GI eval  #6 right upper lobe pneumonia and upper lobe pneumothorax secondary to trauma with fall - on Doxy -CXR showed right UL PTX - s/p Chest tube placement on 04/18/17 and removal on 6/14 per Pulmo DISCHARGE CONDITIONS:  stable CONSULTS OBTAINED:  Treatment Team:  Mady Haagensen, MD Juanell Fairly, MD Clapacs, Jackquline Denmark, MD DRUG ALLERGIES:    Allergies  Allergen Reactions  . Toprol Xl [Metoprolol Tartrate] Other (See Comments)    Bradycardia   DISCHARGE MEDICATIONS:   Allergies as of 04/22/2017      Reactions   Toprol Xl [metoprolol Tartrate] Other (See Comments)   Bradycardia      Medication List    STOP taking these medications   triamcinolone cream 0.1 % Commonly known as:  KENALOG   umeclidinium-vilanterol 62.5-25 MCG/INH Aepb Commonly known as:  ANORO ELLIPTA     TAKE these medications   albuterol (2.5 MG/3ML) 0.083% nebulizer solution Commonly known as:  PROVENTIL INHALE CONTENTS OF 1 VIAL BY MOUTH THROUGH NEBULIZER EVERY 4 TO 6 HOURS AS NEEDED FOR SHORTNESS OF BREATH.   PROVENTIL HFA 108 (90 Base) MCG/ACT inhaler Generic drug:  albuterol INHALE 2 PUFFS BY MOUTH EVERY 6 HOURS AS NEEDED FOR WHEEZING OR SHORTNESS OF BREATH.   allopurinol 300 MG tablet Commonly known as:  ZYLOPRIM TAKE 1 TABLET BY MOUTH ONCE DAILY   amLODipine 10 MG tablet Commonly known as:  NORVASC TAKE 1 TABLET BY MOUTH ONCE DAILY   budesonide-formoterol 160-4.5 MCG/ACT inhaler Commonly known as:  SYMBICORT Inhale 2 puffs into the lungs 2 (two) times daily.   cetirizine 10 MG tablet Commonly known as:  ZYRTEC once daily.   doxycycline 100 MG tablet Commonly known as:  VIBRA-TABS Take 1 tablet (100 mg total) by mouth every 12 (twelve) hours.   feeding supplement (ENSURE ENLIVE) Liqd Take 237 mLs by mouth 2 (two) times daily between meals.   hydrochlorothiazide 25 MG tablet Commonly known as:  HYDRODIURIL Take 1 tablet (25 mg total) by mouth daily.  levothyroxine 100 MCG tablet Commonly known as:  SYNTHROID, LEVOTHROID TAKE 1 TABLET BY MOUTH ONCE DAILY ON AN EMPTY STOMACH. WAIT 30 MINUTES BEFORE TAKING OTHER MEDS.   losartan 100 MG tablet Commonly known as:  COZAAR Take 1 tablet (100 mg total) by mouth daily.   valsartan 320 MG tablet Commonly known as:  DIOVAN Take 320 mg by mouth daily.      DISCHARGE  INSTRUCTIONS:   DIET:  Regular diet DISCHARGE CONDITION:  Good ACTIVITY:  Activity as tolerated OXYGEN:  Home Oxygen: No.  Oxygen Delivery: room air DISCHARGE LOCATION:  home   If you experience worsening of your admission symptoms, develop shortness of breath, life threatening emergency, suicidal or homicidal thoughts you must seek medical attention immediately by calling 911 or calling your MD immediately  if symptoms less severe.  You Must read complete instructions/literature along with all the possible adverse reactions/side effects for all the Medicines you take and that have been prescribed to you. Take any new Medicines after you have completely understood and accpet all the possible adverse reactions/side effects.   Please note  You were cared for by a hospitalist during your hospital stay. If you have any questions about your discharge medications or the care you received while you were in the hospital after you are discharged, you can call the unit and asked to speak with the hospitalist on call if the hospitalist that took care of you is not available. Once you are discharged, your primary care physician will handle any further medical issues. Please note that NO REFILLS for any discharge medications will be authorized once you are discharged, as it is imperative that you return to your primary care physician (or establish a relationship with a primary care physician if you do not have one) for your aftercare needs so that they can reassess your need for medications and monitor your lab values.    On the day of Discharge:  VITAL SIGNS:  Blood pressure (!) 150/83, pulse 99, temperature 98.1 F (36.7 C), temperature source Oral, resp. rate 16, height 6\' 1"  (1.854 m), weight 119.3 kg (263 lb 0.1 oz), SpO2 92 %. PHYSICAL EXAMINATION:  GENERAL:  66 y.o.-year-old patient lying in the bed with no acute distress.  EYES: Pupils equal, round, reactive to light and accommodation. No  scleral icterus. Extraocular muscles intact.  HEENT: Head atraumatic, normocephalic. Oropharynx and nasopharynx clear.  NECK:  Supple, no jugular venous distention. No thyroid enlargement, no tenderness.  LUNGS: Normal breath sounds bilaterally, no wheezing, rales,rhonchi or crepitation. No use of accessory muscles of respiration.  CARDIOVASCULAR: S1, S2 normal. No murmurs, rubs, or gallops.  ABDOMEN: Soft, non-tender, non-distended. Bowel sounds present. No organomegaly or mass.  EXTREMITIES: No pedal edema, cyanosis, or clubbing.  NEUROLOGIC: Cranial nerves II through XII are intact. Muscle strength 5/5 in all extremities. Sensation intact. Gait not checked.  PSYCHIATRIC: The patient is alert and oriented x 3.  SKIN: No obvious rash, lesion, or ulcer.  DATA REVIEW:   CBC  Recent Labs Lab 04/21/17 0506  WBC 10.1  HGB 12.0*  HCT 34.4*  PLT 230    Chemistries   Recent Labs Lab 04/19/17 0524 04/21/17 0506  NA 134* 133*  K 4.1 4.6  CL 97* 95*  CO2 28 30  GLUCOSE 181* 168*  BUN 18 20  CREATININE 0.76 0.61  CALCIUM 8.7* 8.7*  MG 2.2 1.8  AST 21  --   ALT 44  --   ALKPHOS 46  --  BILITOT 0.9  --     Follow-up Information    Steele Sizerrissman, Mark A, MD. Schedule an appointment as soon as possible for a visit in 1 week(s).   Specialty:  Family Medicine Contact information: [address] [place] [address] [phone]        Merwyn KatosSimonds, David B, MD. Schedule an appointment as soon as possible for a visit in 2 day(s).   Specialty:  Pulmonary Disease Contact information: [address] [place] [address] [phone]        Juanell FairlyKrasinski, Kevin, MD. Schedule an appointment as soon as possible for a visit in 3 week(s).   Specialty:  Orthopedic Surgery Contact information: [address] [place] [address] [phone]            Management plans discussed with the patient, family (son Jomarie LongsJoseph via phone) and they are in agreement.  CODE  STATUS: Full Code   TOTAL TIME TAKING CARE OF THIS PATIENT: 45 minutes.    Delfino LovettVipul Jakyra Kenealy M.D on 04/22/2017 at 7:19 AM  Between 7am to 6pm - Pager - [phone]  After 6pm go to [email or web address] - Social research officer, governmentpassword [email or web address]  [facility] [place] [facility]  Office  [phone]  CC: Primary care physician; Steele Sizerrissman, Mark A, MD   Note: This dictation was prepared with Dragon dictation along with smaller phrase technology. Any transcriptional errors that result from this process are unintentional.

## 2017-04-22 NOTE — Clinical Social Work Placement (Signed)
   CLINICAL SOCIAL WORK PLACEMENT  NOTE  Date:  04/22/2017  Patient Details  Name: Arthur Conner MRN: [id number] Date of Birth: 1951-05-23  Clinical Social Work is seeking post-discharge placement for this patient at the Skilled  Nursing Facility level of care (*CSW will initial, date and re-position this form in  chart as items are completed):  Yes   Patient/family provided with [place] Clinical Social Work Department's list of facilities offering this level of care within the geographic area requested by the patient (or if unable, by the patient's family).  Yes   Patient/family informed of their freedom to choose among providers that offer the needed level of care, that participate in Medicare, Medicaid or managed care program needed by the patient, have an available bed and are willing to accept the patient.  Yes   Patient/family informed of [facility]'s ownership interest in [facility] and [facility], as well as of the fact that they are under no obligation to receive care at these facilities.  PASRR submitted to EDS on 04/21/17     PASRR number received on 04/21/17     Existing PASRR number confirmed on       FL2 transmitted to all facilities in geographic area requested by pt/family on       FL2 transmitted to all facilities within larger geographic area on       Patient informed that his/her managed care company has contracts with or will negotiate with certain facilities, including the following:        Yes   Patient/family informed of bed offers received.  Patient chooses bed at  [facility]     Physician recommends and patient chooses bed at  [facility](SNF)    Patient to be transferred to  [facility]) on 04/22/17.  Patient to be transferred to facility by  (family)     Patient family notified on 04/22/17 of transfer.  Name of family member notified:   (patient and son)     PHYSICIAN       Additional Comment:     _______________________________________________ York SpanielMonica Jacquelyn Shadrick, LCSW 04/22/2017, 12:13 PM

## 2017-04-24 ENCOUNTER — Emergency Department: Payer: Medicare Other

## 2017-04-24 ENCOUNTER — Inpatient Hospital Stay
Admission: EM | Admit: 2017-04-24 | Discharge: 2017-04-26 | DRG: 871 | Disposition: A | Discharge status: Skilled Nursing Facility | Payer: Medicare Other | Attending: Internal Medicine | Admitting: Internal Medicine

## 2017-04-24 DIAGNOSIS — A0472 Enterocolitis due to Clostridium difficile, not specified as recurrent: Secondary | ICD-10-CM | POA: Diagnosis not present

## 2017-04-24 DIAGNOSIS — Z79899 Other long term (current) drug therapy: Secondary | ICD-10-CM | POA: Diagnosis not present

## 2017-04-24 DIAGNOSIS — E039 Hypothyroidism, unspecified: Secondary | ICD-10-CM | POA: Diagnosis present

## 2017-04-24 DIAGNOSIS — I10 Essential (primary) hypertension: Secondary | ICD-10-CM | POA: Diagnosis present

## 2017-04-24 DIAGNOSIS — Y95 Nosocomial condition: Secondary | ICD-10-CM | POA: Diagnosis present

## 2017-04-24 DIAGNOSIS — Z801 Family history of malignant neoplasm of trachea, bronchus and lung: Secondary | ICD-10-CM

## 2017-04-24 DIAGNOSIS — N179 Acute kidney failure, unspecified: Secondary | ICD-10-CM

## 2017-04-24 DIAGNOSIS — Z7289 Other problems related to lifestyle: Secondary | ICD-10-CM | POA: Diagnosis present

## 2017-04-24 DIAGNOSIS — Z7951 Long term (current) use of inhaled steroids: Secondary | ICD-10-CM | POA: Diagnosis not present

## 2017-04-24 DIAGNOSIS — M109 Gout, unspecified: Secondary | ICD-10-CM | POA: Diagnosis present

## 2017-04-24 DIAGNOSIS — A419 Sepsis, unspecified organism: Secondary | ICD-10-CM

## 2017-04-24 DIAGNOSIS — J449 Chronic obstructive pulmonary disease, unspecified: Secondary | ICD-10-CM | POA: Diagnosis present

## 2017-04-24 DIAGNOSIS — F17210 Nicotine dependence, cigarettes, uncomplicated: Secondary | ICD-10-CM | POA: Diagnosis present

## 2017-04-24 DIAGNOSIS — J189 Pneumonia, unspecified organism: Secondary | ICD-10-CM | POA: Diagnosis present

## 2017-04-24 DIAGNOSIS — E876 Hypokalemia: Secondary | ICD-10-CM | POA: Diagnosis present

## 2017-04-24 DIAGNOSIS — J470 Bronchiectasis with acute lower respiratory infection: Secondary | ICD-10-CM | POA: Diagnosis present

## 2017-04-24 DIAGNOSIS — Z789 Other specified health status: Secondary | ICD-10-CM | POA: Diagnosis present

## 2017-04-24 DIAGNOSIS — A414 Sepsis due to anaerobes: Secondary | ICD-10-CM | POA: Diagnosis present

## 2017-04-24 DIAGNOSIS — R296 Repeated falls: Secondary | ICD-10-CM | POA: Diagnosis present

## 2017-04-24 DIAGNOSIS — J471 Bronchiectasis with (acute) exacerbation: Secondary | ICD-10-CM | POA: Diagnosis present

## 2017-04-24 DIAGNOSIS — R6521 Severe sepsis with septic shock: Secondary | ICD-10-CM | POA: Diagnosis present

## 2017-04-24 DIAGNOSIS — E872 Acidosis: Secondary | ICD-10-CM | POA: Diagnosis present

## 2017-04-24 DIAGNOSIS — N170 Acute kidney failure with tubular necrosis: Secondary | ICD-10-CM | POA: Diagnosis present

## 2017-04-24 DIAGNOSIS — E860 Dehydration: Secondary | ICD-10-CM | POA: Diagnosis present

## 2017-04-24 DIAGNOSIS — F1090 Alcohol use, unspecified, uncomplicated: Secondary | ICD-10-CM | POA: Diagnosis present

## 2017-04-24 DIAGNOSIS — R6520 Severe sepsis without septic shock: Secondary | ICD-10-CM | POA: Diagnosis not present

## 2017-04-24 LAB — COMPREHENSIVE METABOLIC PANEL
ALK PHOS: 42 U/L (ref 38–126)
ALT: 50 U/L (ref 17–63)
ANION GAP: 10 (ref 5–15)
AST: 44 U/L — ABNORMAL HIGH (ref 15–41)
Albumin: 3.1 g/dL — ABNORMAL LOW (ref 3.5–5.0)
BILIRUBIN TOTAL: 1 mg/dL (ref 0.3–1.2)
BUN: 27 mg/dL — ABNORMAL HIGH (ref 6–20)
CALCIUM: 8.3 mg/dL — AB (ref 8.9–10.3)
CO2: 24 mmol/L (ref 22–32)
CREATININE: 2.68 mg/dL — AB (ref 0.61–1.24)
Chloride: 101 mmol/L (ref 101–111)
GFR, EST AFRICAN AMERICAN: 27 mL/min — AB (ref >60)
GFR, EST NON AFRICAN AMERICAN: 23 mL/min — AB (ref >60)
Glucose, Bld: 190 mg/dL — ABNORMAL HIGH (ref 65–99)
Potassium: 2.7 mmol/L — CL (ref 3.5–5.1)
Sodium: 135 mmol/L (ref 135–145)
TOTAL PROTEIN: 5.9 g/dL — AB (ref 6.5–8.1)

## 2017-04-24 LAB — TYPE AND SCREEN
ABO/RH(D): O POS
Antibody Screen: NEGATIVE

## 2017-04-24 LAB — C DIFFICILE QUICK SCREEN W PCR REFLEX
C DIFFICILE (CDIFF) TOXIN: NEGATIVE
C DIFFICLE (CDIFF) ANTIGEN: POSITIVE — AB

## 2017-04-24 LAB — CBC
HCT: 36.1 % — ABNORMAL LOW (ref 40.0–52.0)
HEMOGLOBIN: 12.5 g/dL — AB (ref 13.0–18.0)
MCH: 33.5 pg (ref 26.0–34.0)
MCHC: 34.7 g/dL (ref 32.0–36.0)
MCV: 96.6 fL (ref 80.0–100.0)
Platelets: 260 10*3/uL (ref 150–440)
RBC: 3.74 MIL/uL — AB (ref 4.40–5.90)
RDW: 15.8 % — ABNORMAL HIGH (ref 11.5–14.5)
WBC: 14.9 10*3/uL — AB (ref 3.8–10.6)

## 2017-04-24 LAB — MAGNESIUM: MAGNESIUM: 1.8 mg/dL (ref 1.7–2.4)

## 2017-04-24 LAB — AMMONIA: AMMONIA: 17 umol/L (ref 9–35)

## 2017-04-24 LAB — BRAIN NATRIURETIC PEPTIDE: B NATRIURETIC PEPTIDE 5: 27 pg/mL (ref 0.0–100.0)

## 2017-04-24 LAB — TROPONIN I: TROPONIN I: 0.05 ng/mL — AB (ref <0.03)

## 2017-04-24 LAB — LIPASE, BLOOD: Lipase: 49 U/L (ref 11–51)

## 2017-04-24 LAB — LACTIC ACID, PLASMA
LACTIC ACID, VENOUS: 3.8 mmol/L — AB (ref 0.5–1.9)
Lactic Acid, Venous: 1.9 mmol/L (ref 0.5–1.9)

## 2017-04-24 MED ORDER — VANCOMYCIN HCL 10 G IV SOLR
1250.0000 mg | INTRAVENOUS | Status: DC
  Administered 2017-04-25: 1250 mg via INTRAVENOUS
  Filled 2017-04-24 (×2): qty 1250

## 2017-04-24 MED ORDER — NOREPINEPHRINE BITARTRATE 1 MG/ML IV SOLN
2.0000 ug/min | Freq: Once | INTRAVENOUS | Status: DC
  Filled 2017-04-24: qty 4

## 2017-04-24 MED ORDER — IPRATROPIUM-ALBUTEROL 0.5-2.5 (3) MG/3ML IN SOLN
3.0000 mL | Freq: Once | RESPIRATORY_TRACT | Status: AC
  Administered 2017-04-24: 3 mL via RESPIRATORY_TRACT
  Filled 2017-04-24: qty 3

## 2017-04-24 MED ORDER — SODIUM CHLORIDE 0.9 % IV BOLUS (SEPSIS)
500.0000 mL | Freq: Once | INTRAVENOUS | Status: AC
  Administered 2017-04-24: 500 mL via INTRAVENOUS

## 2017-04-24 MED ORDER — DEXTROSE 5 % IV SOLN
2.0000 g | Freq: Once | INTRAVENOUS | Status: AC
  Administered 2017-04-24: 2 g via INTRAVENOUS
  Filled 2017-04-24: qty 2

## 2017-04-24 MED ORDER — VANCOMYCIN HCL IN DEXTROSE 1-5 GM/200ML-% IV SOLN
1000.0000 mg | Freq: Once | INTRAVENOUS | Status: AC
  Administered 2017-04-24: 1000 mg via INTRAVENOUS
  Filled 2017-04-24: qty 200

## 2017-04-24 MED ORDER — MAGNESIUM SULFATE 2 GM/50ML IV SOLN
2.0000 g | Freq: Once | INTRAVENOUS | Status: AC
  Administered 2017-04-24: 2 g via INTRAVENOUS
  Filled 2017-04-24: qty 50

## 2017-04-24 MED ORDER — SODIUM CHLORIDE 0.9 % IV BOLUS (SEPSIS)
1000.0000 mL | Freq: Once | INTRAVENOUS | Status: AC
  Administered 2017-04-24: 1000 mL via INTRAVENOUS

## 2017-04-24 MED ORDER — PANTOPRAZOLE SODIUM 40 MG IV SOLR
40.0000 mg | Freq: Once | INTRAVENOUS | Status: AC
  Administered 2017-04-24: 40 mg via INTRAVENOUS
  Filled 2017-04-24: qty 40

## 2017-04-24 MED ORDER — METHYLPREDNISOLONE SODIUM SUCC 125 MG IJ SOLR
60.0000 mg | Freq: Once | INTRAMUSCULAR | Status: AC
  Administered 2017-04-24: 60 mg via INTRAVENOUS
  Filled 2017-04-24: qty 2

## 2017-04-24 MED ORDER — SODIUM CHLORIDE 0.9 % IV SOLN: Freq: Once | INTRAVENOUS | Status: DC

## 2017-04-24 MED ORDER — SODIUM CHLORIDE 0.9 % IV SOLN
0.0000 ug/min | INTRAVENOUS | Status: DC
  Filled 2017-04-24: qty 1

## 2017-04-24 NOTE — Progress Notes (Signed)
eLink Physician-Brief Progress Note Patient Name: Florestine AversJoseph L Kinnard DOB: 05/15/1951 MRN: [id number]   Date of Service  04/24/2017  HPI/Events of Note  Severe sepsis and shock state . On septic shock protocol  eICU Interventions  Ordered another 500cc NS bolus Pt has admit orders.  Elink will track sepsis protocol and CCM aware of admission to SDU/ICU     Intervention Category Major Interventions: Sepsis - evaluation and management;Shock - evaluation and management  Shan Levansatrick Carlyle Achenbach 04/24/2017, 10:47 PM

## 2017-04-24 NOTE — ED Triage Notes (Signed)
Pt came to ED via EMS from [facility]. There for rehab after multiple falls. Pt sent out for hypotension, per EMS pressure was 60/40s upon arrival. Given 1500 fluid by EMS. Bp 95/59. Pt alert and oriented, c/o abdominal cramping. HR 110

## 2017-04-24 NOTE — H&P (Signed)
[facility] [place] at [facility]   PATIENT NAME: Arthur BakerJoseph Conner    MR#:  [id number]  DATE OF BIRTH:  [birth year]  DATE OF ADMISSION:  04/24/2017  PRIMARY CARE PHYSICIAN: Steele Sizerrissman, Mark A, MD   REQUESTING/REFERRING PHYSICIAN: Roxan Hockeyobinson, MD  CHIEF COMPLAINT:   Chief Complaint  Patient presents with  . Hypotension    HISTORY OF PRESENT ILLNESS:  Arthur Conner  is a 66 y.o. male who presents with Malaise, cough, fever. Patient was recently discharged from the hospital, and spent a few days in rehabilitation before having to come back tonight with fever. Here he was found to be septic with multifocal pneumonia. He is also been having significant watery diarrhea that is green in color. Hospitalists were called for admission.  PAST MEDICAL HISTORY:   Past Medical History:  Diagnosis Date  . Anxiety   . Asthma   . Depression   . Gout   . Hypertension     PAST SURGICAL HISTORY:   Past Surgical History:  Procedure Laterality Date  . APPENDECTOMY    . TONSILLECTOMY      SOCIAL HISTORY:   Social History  Substance Use Topics  . Smoking status: Current Every Day Smoker    Packs/day: 0.50    Types: Cigarettes    Last attempt to quit: 08/11/1990  . Smokeless tobacco: Never Used  . Alcohol use 0.0 oz/week     Comment: "I'm not even sure how much I drink"    FAMILY HISTORY:   Family History  Problem Relation Age of Onset  . Lung cancer Father   . Aneurysm Mother     DRUG ALLERGIES:   Allergies  Allergen Reactions  . Toprol Xl [Metoprolol Tartrate] Other (See Comments)    Bradycardia    MEDICATIONS AT HOME:   Prior to Admission medications   Medication Sig Start Date End Date Taking? Authorizing Provider  albuterol (PROVENTIL) (2.5 MG/3ML) 0.083% nebulizer solution INHALE CONTENTS OF 1 VIAL BY MOUTH THROUGH NEBULIZER EVERY 4 TO 6 HOURS AS NEEDED FOR SHORTNESS OF BREATH. 09/08/15  Yes Crissman, Redge GainerMark A, MD  allopurinol (ZYLOPRIM) 300  MG tablet TAKE 1 TABLET BY MOUTH ONCE DAILY 10/18/16  Yes Crissman, Mark A, MD  amLODipine (NORVASC) 10 MG tablet TAKE 1 TABLET BY MOUTH ONCE DAILY 12/22/15  Yes Crissman, Redge GainerMark A, MD  budesonide-formoterol (SYMBICORT) 160-4.5 MCG/ACT inhaler Inhale 2 puffs into the lungs 2 (two) times daily.   Yes [provider]  cetirizine (ZYRTEC) 10 MG tablet once daily.   Yes [provider]  doxycycline (VIBRA-TABS) 100 MG tablet Take 1 tablet (100 mg total) by mouth every 12 (twelve) hours. 04/22/17  Yes Delfino LovettShah, Vipul, MD  feeding supplement, ENSURE ENLIVE, (ENSURE ENLIVE) LIQD Take 237 mLs by mouth 2 (two) times daily between meals. 04/22/17  Yes Delfino LovettShah, Vipul, MD  hydrochlorothiazide (HYDRODIURIL) 25 MG tablet Take 1 tablet (25 mg total) by mouth daily. 07/29/15  Yes Johnson, Megan P, DO  levothyroxine (SYNTHROID, LEVOTHROID) 100 MCG tablet TAKE 1 TABLET BY MOUTH ONCE DAILY ON AN EMPTY STOMACH. WAIT 30 MINUTES BEFORE TAKING OTHER MEDS. 08/16/16  Yes Crissman, Redge GainerMark A, MD  PROVENTIL HFA 108 (90 Base) MCG/ACT inhaler INHALE 2 PUFFS BY MOUTH EVERY 6 HOURS AS NEEDED FOR WHEEZING OR SHORTNESS OF BREATH. 01/24/17  Yes Crissman, Redge GainerMark A, MD  valsartan (DIOVAN) 320 MG tablet Take 320 mg by mouth daily.   Yes [provider]  losartan (COZAAR) 100 MG tablet Take 1 tablet (  100 mg total) by mouth daily. Patient not taking: Reported on 04/14/2017 09/10/15   Steele Sizer, MD    REVIEW OF SYSTEMS:  Review of Systems  Constitutional: Positive for fever and malaise/fatigue. Negative for chills and weight loss.  HENT: Negative for ear pain, hearing loss and tinnitus.   Eyes: Negative for blurred vision, double vision, pain and redness.  Respiratory: Positive for cough and shortness of breath. Negative for hemoptysis.   Cardiovascular: Negative for chest pain, palpitations, orthopnea and leg swelling.  Gastrointestinal: Positive for diarrhea. Negative for abdominal pain, constipation, nausea and vomiting.   Genitourinary: Negative for dysuria, frequency and hematuria.  Musculoskeletal: Negative for back pain, joint pain and neck pain.  Skin:       No acne, rash, or lesions  Neurological: Negative for dizziness, tremors, focal weakness and weakness.  Endo/Heme/Allergies: Negative for polydipsia. Does not bruise/bleed easily.  Psychiatric/Behavioral: Negative for depression. The patient is not nervous/anxious and does not have insomnia.      VITAL SIGNS:   Vitals:   04/24/17 2019 04/24/17 2020 04/24/17 2025 04/24/17 2030  BP: (!) 84/48 (!) 84/69 93/61 96/66   Pulse:  96 94 93  Resp:  (!) 26 (!) 22 20  Temp:      TempSrc:      SpO2:  95% 92% 94%  Weight:      Height:       Wt Readings from Last 3 Encounters:  04/24/17 111.1 kg (245 lb)  04/14/17 119.3 kg (263 lb 0.1 oz)  11/06/15 129.5 kg (285 lb 6.4 oz)    PHYSICAL EXAMINATION:  Physical Exam  Vitals reviewed. Constitutional: He is oriented to person, place, and time. He appears well-developed and well-nourished. No distress.  HENT:  Head: Normocephalic and atraumatic.  Mouth/Throat: Oropharynx is clear and moist.  Eyes: Conjunctivae and EOM are normal. Pupils are equal, round, and reactive to light. No scleral icterus.  Neck: Normal range of motion. Neck supple. No JVD present. No thyromegaly present.  Cardiovascular: Normal rate, regular rhythm and intact distal pulses.  Exam reveals no gallop and no friction rub.   No murmur heard. Respiratory: Effort normal. No respiratory distress. He has no wheezes. He has no rales.  Bilateral rhonchi  GI: Soft. Bowel sounds are normal. He exhibits no distension. There is tenderness.  Musculoskeletal: Normal range of motion. He exhibits no edema.  No arthritis, no gout  Lymphadenopathy:    He has no cervical adenopathy.  Neurological: He is alert and oriented to person, place, and time. No cranial nerve deficit.  No dysarthria, no aphasia  Skin: Skin is warm and dry. No rash noted.  No erythema.  Psychiatric: He has a normal mood and affect. His behavior is normal. Judgment and thought content normal.    LABORATORY PANEL:   CBC  Recent Labs Lab 04/24/17 1737  WBC 14.9*  HGB 12.5*  HCT 36.1*  PLT 260   ------------------------------------------------------------------------------------------------------------------  Chemistries   Recent Labs Lab 04/24/17 1737 04/24/17 1857  NA 135  --   K 2.7*  --   CL 101  --   CO2 24  --   GLUCOSE 190*  --   BUN 27*  --   CREATININE 2.68*  --   CALCIUM 8.3*  --   MG  --  1.8  AST 44*  --   ALT 50  --   ALKPHOS 42  --   BILITOT 1.0  --    ------------------------------------------------------------------------------------------------------------------  Cardiac Enzymes  Recent Labs Lab 04/24/17 1737  TROPONINI 0.05*   ------------------------------------------------------------------------------------------------------------------  RADIOLOGY:  Ct Abdomen Pelvis Wo Contrast  Result Date: 04/24/2017 CLINICAL DATA:  Hypertension.  Abdominal cramping. EXAM: CT CHEST, ABDOMEN AND PELVIS WITHOUT CONTRAST TECHNIQUE: Multidetector CT imaging of the chest, abdomen and pelvis was performed following the standard protocol without IV contrast. COMPARISON:  Chest radiograph 04/24/2017 FINDINGS: CT CHEST FINDINGS Cardiovascular: Normal caliber of the great vessels. Minimal calcific atherosclerotic disease of the aorta. Normal heart size. No pericardial effusion. Mediastinum/Nodes: No enlarged mediastinal, hilar, or axillary lymph nodes. Thyroid gland, trachea, and esophagus demonstrate no significant findings. Lungs/Pleura: Upper lobe predominant emphysema. Peripheral linear peribronchial opacities and mild bronchiectasis, likely represent chronic interstitial lung disease patchy ground-glass opacities also and peripheral predominance are present in both lungs. Right middle lobe volume loss . Musculoskeletal: Multilevel  osteoarthritic changes. CT ABDOMEN PELVIS FINDINGS Hepatobiliary: Subcapsular area of hypoattenuation adjacent to the intersegmental fissure in the left lobe of the liver, likely represents focal fatty infiltration. No gallstones, gallbladder wall thickening, or biliary dilatation. Pancreas: Unremarkable. No pancreatic ductal dilatation or surrounding inflammatory changes. Spleen: Normal in size without focal abnormality. Adrenals/Urinary Tract: Adrenal glands are unremarkable. Kidneys are normal, without renal calculi, focal lesion, or hydronephrosis. Bladder is unremarkable. Stomach/Bowel: Stomach is within normal limits. No evidence of bowel wall thickening, distention, or inflammatory changes. Gas fluid levels within the transverse and descending colon, likely due to diarrheal state. Vascular/Lymphatic: Minimal aortic atherosclerosis. No enlarged abdominal or pelvic lymph nodes. Reproductive: Heterogeneous appearance of the prostate gland. Other: No abdominal wall hernia or abnormality. No abdominopelvic ascites. Musculoskeletal: Multilevel osteoarthritic changes of the spine. Bilateral L5 pars articularis defects. Minimal posterior listhesis of L4 on L5. IMPRESSION: Upper lobe predominant emphysema. Chronic interstitial lung disease with bronchiectasis, fibrotic changes and volume loss. Superimposed ground-glass opacities in predominantly peripheral distribution may represent an element of acute infectious/ inflammatory changes on the background of chronic interstitial lung disease. Minimal calcific atherosclerotic disease of the aorta. No acute abnormalities within the abdomen. Heterogeneous appearance of the prostate gland. Please correlate to patient's PSA values. Electronically Signed   By: Ted Mcalpine M.D.   On: 04/24/2017 19:55   Dg Chest 1 View  Result Date: 04/24/2017 CLINICAL DATA:  Acute shortness of breath. EXAM: CHEST 1 VIEW COMPARISON:  April 21, 2017 FINDINGS: Focal infiltrate in the  left mid lung and perhaps the left base. Increasing haziness over the lateral right lung base with tube removal in the interval. The cardiomediastinal silhouette is stable. No other changes. IMPRESSION: 1. Developing infiltrate in the left mid and lower lung. Recommend follow-up to resolution. 2. Possible developing effusion on the right. Electronically Signed   By: Gerome Sam III M.D   On: 04/24/2017 19:14   Ct Chest Wo Contrast  Result Date: 04/24/2017 CLINICAL DATA:  Hypertension.  Abdominal cramping. EXAM: CT CHEST, ABDOMEN AND PELVIS WITHOUT CONTRAST TECHNIQUE: Multidetector CT imaging of the chest, abdomen and pelvis was performed following the standard protocol without IV contrast. COMPARISON:  Chest radiograph 04/24/2017 FINDINGS: CT CHEST FINDINGS Cardiovascular: Normal caliber of the great vessels. Minimal calcific atherosclerotic disease of the aorta. Normal heart size. No pericardial effusion. Mediastinum/Nodes: No enlarged mediastinal, hilar, or axillary lymph nodes. Thyroid gland, trachea, and esophagus demonstrate no significant findings. Lungs/Pleura: Upper lobe predominant emphysema. Peripheral linear peribronchial opacities and mild bronchiectasis, likely represent chronic interstitial lung disease patchy ground-glass opacities also and peripheral predominance are present in both lungs. Right middle lobe volume loss . Musculoskeletal: Multilevel osteoarthritic  changes. CT ABDOMEN PELVIS FINDINGS Hepatobiliary: Subcapsular area of hypoattenuation adjacent to the intersegmental fissure in the left lobe of the liver, likely represents focal fatty infiltration. No gallstones, gallbladder wall thickening, or biliary dilatation. Pancreas: Unremarkable. No pancreatic ductal dilatation or surrounding inflammatory changes. Spleen: Normal in size without focal abnormality. Adrenals/Urinary Tract: Adrenal glands are unremarkable. Kidneys are normal, without renal calculi, focal lesion, or  hydronephrosis. Bladder is unremarkable. Stomach/Bowel: Stomach is within normal limits. No evidence of bowel wall thickening, distention, or inflammatory changes. Gas fluid levels within the transverse and descending colon, likely due to diarrheal state. Vascular/Lymphatic: Minimal aortic atherosclerosis. No enlarged abdominal or pelvic lymph nodes. Reproductive: Heterogeneous appearance of the prostate gland. Other: No abdominal wall hernia or abnormality. No abdominopelvic ascites. Musculoskeletal: Multilevel osteoarthritic changes of the spine. Bilateral L5 pars articularis defects. Minimal posterior listhesis of L4 on L5. IMPRESSION: Upper lobe predominant emphysema. Chronic interstitial lung disease with bronchiectasis, fibrotic changes and volume loss. Superimposed ground-glass opacities in predominantly peripheral distribution may represent an element of acute infectious/ inflammatory changes on the background of chronic interstitial lung disease. Minimal calcific atherosclerotic disease of the aorta. No acute abnormalities within the abdomen. Heterogeneous appearance of the prostate gland. Please correlate to patient's PSA values. Electronically Signed   By: Ted Mcalpine M.D.   On: 04/24/2017 19:55    EKG:   Orders placed or performed during the hospital encounter of 04/24/17  . ED EKG 12-Lead  . ED EKG 12-Lead  . EKG 12-Lead  . EKG 12-Lead    IMPRESSION AND PLAN:  Principal Problem:   Sepsis (HCC) - IV antibiotics started in the ED, blood and urine cultures sent from the ED, lactic acid initially elevated, IV fluids for support and treatment until within normal limits, blood pressure mildly hypotensive, though with a good MAP. Active Problems:   COPD (chronic obstructive pulmonary disease) (HCC) - continue home inhalers   HCAP (healthcare-associated pneumonia) - IV antibiotics with supportive treatment as above   Essential hypertension - hold antihypertensives for now as the  patient's blood pressure is low   Hypothyroidism - home dose thyroid replacement  All the records are reviewed and case discussed with ED provider. Management plans discussed with the patient and/or family.  DVT PROPHYLAXIS: SubQ lovenox  GI PROPHYLAXIS: None  ADMISSION STATUS: Inpatient  CODE STATUS: Full Code Status History    Date Active Date Inactive Code Status Order ID Comments User Context   04/14/2017  7:28 PM 04/22/2017  4:43 PM Full Code [id number]  Ramonita Lab, MD Inpatient    Advance Directive Documentation     Most Recent Value  Type of Advance Directive  Healthcare Power of Attorney, Living will  Pre-existing out of facility DNR order (yellow form or pink MOST form)  -  "MOST" Form in Place?  -      TOTAL TIME TAKING CARE OF THIS PATIENT: 45 minutes.   Canon Gola FIELDING 04/24/2017, 9:17 PM  [facility]  Office  [phone]  CC: Primary care physician; Steele Sizer, MD  Note:  This document was prepared using Dragon voice recognition software and may include unintentional dictation errors.

## 2017-04-24 NOTE — ED Notes (Signed)
X-ray at bedside

## 2017-04-24 NOTE — ED Provider Notes (Signed)
[facility] Emergency Department Provider Note    First MD Initiated Contact with Patient 04/24/17 1739     (approximate)  I have reviewed the triage vital signs and the nursing notes.   HISTORY  Chief Complaint Hypotension    HPI Arthur Conner is a 66 y.o. male presents to the ER from rehab with concern for low blood pressures. Patient with recent complex medical history please see medical record for further details. On arrival patient is ill-appearing. He is mentating well but in no acute distress and is protecting his airway. Denies any recent falls. Is complaining of abdominal pain and multiple episodes of diarrhea. Also has productive cough and does have a history of COPD.  Not on blood thinners  Past Medical History:  Diagnosis Date  . Anxiety   . Asthma   . Depression   . Gout   . Hypertension    Family History  Problem Relation Age of Onset  . Lung cancer Father   . Aneurysm Mother    Past Surgical History:  Procedure Laterality Date  . APPENDECTOMY    . TONSILLECTOMY     Patient Active Problem List   Diagnosis Date Noted  . Sepsis (HCC) 04/24/2017  . HCAP (healthcare-associated pneumonia) 04/24/2017  . Alcohol use 04/24/2017  . Delirium tremens (HCC) 04/19/2017  . Subacute delirium 04/19/2017  . COPD (chronic obstructive pulmonary disease) (HCC) 04/18/2017  . Hyponatremia 04/14/2017  . Asthmatic bronchitis 10/31/2015  . Hypothyroidism 08/12/2015  . Asthma exacerbation 07/29/2015  . Essential hypertension 04/24/2015      Prior to Admission medications   Medication Sig Start Date End Date Taking? Authorizing Provider  albuterol (PROVENTIL) (2.5 MG/3ML) 0.083% nebulizer solution INHALE CONTENTS OF 1 VIAL BY MOUTH THROUGH NEBULIZER EVERY 4 TO 6 HOURS AS NEEDED FOR SHORTNESS OF BREATH. 09/08/15  Yes Crissman, Redge Gainer, MD  allopurinol (ZYLOPRIM) 300 MG tablet TAKE 1 TABLET BY MOUTH ONCE DAILY 10/18/16  Yes Crissman, Mark A, MD    amLODipine (NORVASC) 10 MG tablet TAKE 1 TABLET BY MOUTH ONCE DAILY 12/22/15  Yes Crissman, Redge Gainer, MD  budesonide-formoterol (SYMBICORT) 160-4.5 MCG/ACT inhaler Inhale 2 puffs into the lungs 2 (two) times daily.   Yes [provider]  cetirizine (ZYRTEC) 10 MG tablet once daily.   Yes [provider]  doxycycline (VIBRA-TABS) 100 MG tablet Take 1 tablet (100 mg total) by mouth every 12 (twelve) hours. 04/22/17  Yes Delfino Lovett, MD  feeding supplement, ENSURE ENLIVE, (ENSURE ENLIVE) LIQD Take 237 mLs by mouth 2 (two) times daily between meals. 04/22/17  Yes Delfino Lovett, MD  hydrochlorothiazide (HYDRODIURIL) 25 MG tablet Take 1 tablet (25 mg total) by mouth daily. 07/29/15  Yes Johnson, Megan P, DO  levothyroxine (SYNTHROID, LEVOTHROID) 100 MCG tablet TAKE 1 TABLET BY MOUTH ONCE DAILY ON AN EMPTY STOMACH. WAIT 30 MINUTES BEFORE TAKING OTHER MEDS. 08/16/16  Yes Crissman, Redge Gainer, MD  PROVENTIL HFA 108 (90 Base) MCG/ACT inhaler INHALE 2 PUFFS BY MOUTH EVERY 6 HOURS AS NEEDED FOR WHEEZING OR SHORTNESS OF BREATH. 01/24/17  Yes Crissman, Redge Gainer, MD  valsartan (DIOVAN) 320 MG tablet Take 320 mg by mouth daily.   Yes [provider]  losartan (COZAAR) 100 MG tablet Take 1 tablet (100 mg total) by mouth daily. Patient not taking: Reported on 04/14/2017 09/10/15   Steele Sizer, MD    Allergies Toprol xl [metoprolol tartrate]    Social History Social History  Substance Use Topics  .  Smoking status: Current Every Day Smoker    Packs/day: 0.50    Types: Cigarettes    Last attempt to quit: 08/11/1990  . Smokeless tobacco: Never Used  . Alcohol use 0.0 oz/week     Comment: "I'm not even sure how much I drink"    Review of Systems Patient denies headaches, rhinorrhea, blurry vision, numbness, shortness of breath, chest pain, edema, cough, abdominal pain, nausea, vomiting, diarrhea, dysuria, fevers, rashes or hallucinations unless otherwise stated above in  HPI. ____________________________________________   PHYSICAL EXAM:  VITAL SIGNS: Vitals:   04/24/17 2025 04/24/17 2030  BP: 93/61 96/66  Pulse: 94 93  Resp: (!) 22 20  Temp:      Constitutional: Alert and oriented. Critically ill appearing Eyes: Conjunctivae are normal.  Head: Atraumatic. Nose: No congestion/rhinnorhea. Mouth/Throat: Mucous membranes are moist.   Neck: No stridor. Painless ROM.  Cardiovascular: tachycardic, regular rhythm. Grossly normal heart sounds.  Delayed cap refill Respiratory: mildly tachypnic with coarse rhocnhorus breathsounds throughout Gastrointestinal: Soft and nontender. No distention. No abdominal bruits. No CVA tenderness. Genitourinary:  Musculoskeletal: No lower extremity tenderness nor edema.  No joint effusions. Neurologic:  Normal speech and language. No gross focal neurologic deficits are appreciated. No facial droop Skin:  Skin is warm, dry and intact. Scattered ecchymosis throughout Psychiatric: Mood and affect are normal. Speech and behavior are normal.  ____________________________________________   LABS (all labs ordered are listed, but only abnormal results are displayed)  Results for orders placed or performed during the hospital encounter of 04/24/17 (from the past 24 hour(s))  Lipase, blood     Status: None   Collection Time: 04/24/17  5:37 PM  Result Value Ref Range   Lipase 49 11 - 51 U/L  Comprehensive metabolic panel     Status: Abnormal   Collection Time: 04/24/17  5:37 PM  Result Value Ref Range   Sodium 135 135 - 145 mmol/L   Potassium 2.7 (LL) 3.5 - 5.1 mmol/L   Chloride 101 101 - 111 mmol/L   CO2 24 22 - 32 mmol/L   Glucose, Bld 190 (H) 65 - 99 mg/dL   BUN 27 (H) 6 - 20 mg/dL   Creatinine, Ser 0.98 (H) 0.61 - 1.24 mg/dL   Calcium 8.3 (L) 8.9 - 10.3 mg/dL   Total Protein 5.9 (L) 6.5 - 8.1 g/dL   Albumin 3.1 (L) 3.5 - 5.0 g/dL   AST 44 (H) 15 - 41 U/L   ALT 50 17 - 63 U/L   Alkaline Phosphatase 42 38 - 126  U/L   Total Bilirubin 1.0 0.3 - 1.2 mg/dL   GFR calc non Af Amer 23 (L) >60 mL/min   GFR calc Af Amer 27 (L) >60 mL/min   Anion gap 10 5 - 15  CBC     Status: Abnormal   Collection Time: 04/24/17  5:37 PM  Result Value Ref Range   WBC 14.9 (H) 3.8 - 10.6 K/uL   RBC 3.74 (L) 4.40 - 5.90 MIL/uL   Hemoglobin 12.5 (L) 13.0 - 18.0 g/dL   HCT 11.9 (L) 14.7 - 82.9 %   MCV 96.6 80.0 - 100.0 fL   MCH 33.5 26.0 - 34.0 pg   MCHC 34.7 32.0 - 36.0 g/dL   RDW 56.2 (H) 13.0 - 86.5 %   Platelets 260 150 - 440 K/uL  Troponin I     Status: Abnormal   Collection Time: 04/24/17  5:37 PM  Result Value Ref Range   Troponin I  0.05 (HH) <0.03 ng/mL  Brain natriuretic peptide     Status: None   Collection Time: 04/24/17  5:37 PM  Result Value Ref Range   B Natriuretic Peptide 27.0 0.0 - 100.0 pg/mL  Lactic acid, plasma     Status: Abnormal   Collection Time: 04/24/17  6:20 PM  Result Value Ref Range   Lactic Acid, Venous 3.8 (HH) 0.5 - 1.9 mmol/L  Type and screen [facility]     Status: None   Collection Time: 04/24/17  6:22 PM  Result Value Ref Range   ABO/RH(D) O POS    Antibody Screen NEG    Sample Expiration 04/27/2017   Magnesium     Status: None   Collection Time: 04/24/17  6:57 PM  Result Value Ref Range   Magnesium 1.8 1.7 - 2.4 mg/dL  Ammonia     Status: None   Collection Time: 04/24/17  6:57 PM  Result Value Ref Range   Ammonia 17 9 - 35 umol/L   ____________________________________________  EKG My review and personal interpretation at Time: 17:38   Indication: hypotension  Rate: 110  Rhythm: sinus Axis: normal Other: non specific st changes, no STEMI criteria ____________________________________________  RADIOLOGY  I personally reviewed all radiographic images ordered to evaluate for the above acute complaints and reviewed radiology reports and findings.  These findings were personally discussed with the patient.  Please see medical record for radiology  report.   ____________________________________________   PROCEDURES  Procedure(s) performed:  Procedures    Critical Care performed: yes CRITICAL CARE Performed by: Willy Eddy   Total critical care time: 50 minutes  Critical care time was exclusive of separately billable procedures and treating other patients.  Critical care was necessary to treat or prevent imminent or life-threatening deterioration.  Critical care was time spent personally by me on the following activities: development of treatment plan with patient and/or surrogate as well as nursing, discussions with consultants, evaluation of patient's response to treatment, examination of patient, obtaining history from patient or surrogate, ordering and performing treatments and interventions, ordering and review of laboratory studies, ordering and review of radiographic studies, pulse oximetry and re-evaluation of patient's condition.  ____________________________________________   INITIAL IMPRESSION / ASSESSMENT AND PLAN / ED COURSE  Pertinent labs & imaging results that were available during my care of the patient were reviewed by me and considered in my medical decision making (see chart for details).  DDX: sepsis, bacteremia, hemorrhage, pna, acs  Arthur Conner is a 66 y.o. who presents to the ED with Hypotension with complex recent admission hospital. Patient is currently ill appearing but is protecting his airway.  Based on his presentation hypotension we'll start with IV fluids for resuscitation. I'm concerned for sepsis. Patient is complaining of bloody diarrhea however guaiac is only faintly positive. Possible component of colitis.  The patient will be placed on continuous pulse oximetry and telemetry for monitoring.  Laboratory evaluation will be sent to evaluate for the above complaints.     Clinical Course as of Apr 24 2122  Sun Apr 24, 2017  1913 Patient responded to volume resuscitation.  Bedside ultrasound does show evidence of significant collapsible IVC.  Patient retaken a CT imaging due to concern for intra-abdominal process versus post chest tube pneumonia or effusion.  [PR]  1940 Lactate elevated blood pressure seems to be in stabilizing and his heart rate is improving. Will continue to monitor. Currently awaiting results of CT imaging.  [PR]  2020  Based on her persistent low blood pressure but his maps remained greater than 65. Heart rate is improving with IV fluids. Does seem to be volume responsive. We'll continue IV fluid resuscitation. CT imaging showing evidence of multiple lobar pneumonia. Patient will require admission for further evaluation and management of his acute sepsis.  [PR]  2055 Sepsis - Repeat Assessment  Performed at:    now  Vitals     @VITALSNOTREFRESHABLE @  Heart:     Regular rate and rhythm  Lungs:    Rhonchi  Capillary Refill:   <2 sec  Peripheral Pulse:   Radial pulse palpable  Skin:     Dry  [PR]    Clinical Course User Index [PR] Willy Eddyobinson, Rhilynn Preyer, MD     ____________________________________________   FINAL CLINICAL IMPRESSION(S) / ED DIAGNOSES  Final diagnoses:  Severe sepsis with septic shock (HCC)  HCAP (healthcare-associated pneumonia)  AKI (acute kidney injury) (HCC)      NEW MEDICATIONS STARTED DURING THIS VISIT:  New Prescriptions   No medications on file     Note:  This document was prepared using Dragon voice recognition software and may include unintentional dictation errors.    Willy Eddyobinson, Romualdo Prosise, MD 04/24/17 2124

## 2017-04-25 ENCOUNTER — Encounter: Payer: Self-pay | Admitting: Internal Medicine

## 2017-04-25 DIAGNOSIS — A419 Sepsis, unspecified organism: Secondary | ICD-10-CM

## 2017-04-25 DIAGNOSIS — A0472 Enterocolitis due to Clostridium difficile, not specified as recurrent: Secondary | ICD-10-CM

## 2017-04-25 DIAGNOSIS — R6520 Severe sepsis without septic shock: Secondary | ICD-10-CM

## 2017-04-25 LAB — BASIC METABOLIC PANEL
Anion gap: 5 (ref 5–15)
BUN: 24 mg/dL — AB (ref 6–20)
CALCIUM: 7.4 mg/dL — AB (ref 8.9–10.3)
CO2: 20 mmol/L — AB (ref 22–32)
CREATININE: 1.77 mg/dL — AB (ref 0.61–1.24)
Chloride: 107 mmol/L (ref 101–111)
GFR calc Af Amer: 45 mL/min — ABNORMAL LOW (ref >60)
GFR calc non Af Amer: 39 mL/min — ABNORMAL LOW (ref >60)
GLUCOSE: 195 mg/dL — AB (ref 65–99)
Potassium: 3.4 mmol/L — ABNORMAL LOW (ref 3.5–5.1)
Sodium: 132 mmol/L — ABNORMAL LOW (ref 135–145)

## 2017-04-25 LAB — TROPONIN I
Troponin I: 0.04 ng/mL (ref <0.03)
Troponin I: 0.04 ng/mL (ref <0.03)

## 2017-04-25 LAB — CBC
HCT: 31.1 % — ABNORMAL LOW (ref 40.0–52.0)
Hemoglobin: 10.7 g/dL — ABNORMAL LOW (ref 13.0–18.0)
MCH: 33.1 pg (ref 26.0–34.0)
MCHC: 34.6 g/dL (ref 32.0–36.0)
MCV: 95.8 fL (ref 80.0–100.0)
Platelets: 216 10*3/uL (ref 150–440)
RBC: 3.25 MIL/uL — ABNORMAL LOW (ref 4.40–5.90)
RDW: 16 % — ABNORMAL HIGH (ref 11.5–14.5)
WBC: 11.6 10*3/uL — ABNORMAL HIGH (ref 3.8–10.6)

## 2017-04-25 LAB — MRSA PCR SCREENING: MRSA by PCR: NEGATIVE

## 2017-04-25 LAB — GASTROINTESTINAL PANEL BY PCR, STOOL (REPLACES STOOL CULTURE)

## 2017-04-25 LAB — URINALYSIS, COMPLETE (UACMP) WITH MICROSCOPIC
BILIRUBIN URINE: NEGATIVE
Glucose, UA: 150 mg/dL — AB
Ketones, ur: NEGATIVE mg/dL
LEUKOCYTES UA: NEGATIVE
Nitrite: NEGATIVE
Protein, ur: NEGATIVE mg/dL
SQUAMOUS EPITHELIAL / LPF: NONE SEEN
Specific Gravity, Urine: 1.006 (ref 1.005–1.030)
pH: 5 (ref 5.0–8.0)

## 2017-04-25 LAB — MAGNESIUM: Magnesium: 2.1 mg/dL (ref 1.7–2.4)

## 2017-04-25 LAB — PROCALCITONIN

## 2017-04-25 LAB — GLUCOSE, CAPILLARY: Glucose-Capillary: 206 mg/dL — ABNORMAL HIGH (ref 65–99)

## 2017-04-25 LAB — PHOSPHORUS: Phosphorus: 3.8 mg/dL (ref 2.5–4.6)

## 2017-04-25 LAB — CLOSTRIDIUM DIFFICILE BY PCR: CDIFFPCR: POSITIVE — AB

## 2017-04-25 MED ORDER — VITAMIN B-1 100 MG PO TABS
100.0000 mg | ORAL_TABLET | Freq: Every day | ORAL | Status: DC
  Administered 2017-04-25 – 2017-04-26 (×2): 100 mg via ORAL
  Filled 2017-04-25 (×2): qty 1

## 2017-04-25 MED ORDER — METRONIDAZOLE IN NACL 5-0.79 MG/ML-% IV SOLN
500.0000 mg | Freq: Three times a day (TID) | INTRAVENOUS | Status: DC
  Administered 2017-04-25 (×2): 500 mg via INTRAVENOUS
  Filled 2017-04-25 (×4): qty 100

## 2017-04-25 MED ORDER — IPRATROPIUM BROMIDE 0.02 % IN SOLN: 0.5000 mg | Freq: Four times a day (QID) | RESPIRATORY_TRACT | Status: DC

## 2017-04-25 MED ORDER — IPRATROPIUM-ALBUTEROL 0.5-2.5 (3) MG/3ML IN SOLN
3.0000 mL | Freq: Once | RESPIRATORY_TRACT | Status: AC
  Administered 2017-04-25: 3 mL via RESPIRATORY_TRACT

## 2017-04-25 MED ORDER — LORAZEPAM 2 MG PO TABS
0.0000 mg | ORAL_TABLET | Freq: Four times a day (QID) | ORAL | Status: DC
Start: 2017-04-25 — End: 2017-04-26
  Administered 2017-04-26: 2 mg via ORAL
  Filled 2017-04-25: qty 1

## 2017-04-25 MED ORDER — ONDANSETRON HCL 4 MG/2ML IJ SOLN: 4.0000 mg | Freq: Four times a day (QID) | INTRAMUSCULAR | Status: DC | PRN

## 2017-04-25 MED ORDER — ORAL CARE MOUTH RINSE
15.0000 mL | Freq: Two times a day (BID) | OROMUCOSAL | Status: DC
  Administered 2017-04-25 – 2017-04-26 (×2): 15 mL via OROMUCOSAL

## 2017-04-25 MED ORDER — ADULT MULTIVITAMIN W/MINERALS CH
1.0000 | ORAL_TABLET | Freq: Every day | ORAL | Status: DC
  Administered 2017-04-25 – 2017-04-26 (×2): 1 via ORAL
  Filled 2017-04-25 (×2): qty 1

## 2017-04-25 MED ORDER — SODIUM CHLORIDE 0.9 % IV BOLUS (SEPSIS)
500.0000 mL | INTRAVENOUS | Status: AC
  Administered 2017-04-25 (×2): 500 mL via INTRAVENOUS

## 2017-04-25 MED ORDER — ALBUTEROL SULFATE (2.5 MG/3ML) 0.083% IN NEBU: 2.5000 mg | INHALATION_SOLUTION | Freq: Four times a day (QID) | RESPIRATORY_TRACT | Status: DC

## 2017-04-25 MED ORDER — PREDNISONE 50 MG PO TABS
50.0000 mg | ORAL_TABLET | Freq: Every day | ORAL | Status: DC
  Administered 2017-04-25 – 2017-04-26 (×2): 50 mg via ORAL
  Filled 2017-04-25 (×2): qty 1

## 2017-04-25 MED ORDER — LORAZEPAM 1 MG PO TABS
1.0000 mg | ORAL_TABLET | Freq: Four times a day (QID) | ORAL | Status: DC | PRN
  Administered 2017-04-25: 1 mg via ORAL
  Filled 2017-04-25: qty 1

## 2017-04-25 MED ORDER — BUDESONIDE 0.25 MG/2ML IN SUSP: 0.2500 mg | Freq: Two times a day (BID) | RESPIRATORY_TRACT | Status: DC

## 2017-04-25 MED ORDER — MOMETASONE FURO-FORMOTEROL FUM 200-5 MCG/ACT IN AERO
2.0000 | INHALATION_SPRAY | Freq: Two times a day (BID) | RESPIRATORY_TRACT | Status: DC
  Filled 2017-04-25: qty 8.8

## 2017-04-25 MED ORDER — HEPARIN SODIUM (PORCINE) 5000 UNIT/ML IJ SOLN
5000.0000 [IU] | Freq: Three times a day (TID) | INTRAMUSCULAR | Status: DC
  Administered 2017-04-25 – 2017-04-26 (×4): 5000 [IU] via SUBCUTANEOUS
  Filled 2017-04-25 (×4): qty 1

## 2017-04-25 MED ORDER — THIAMINE HCL 100 MG/ML IJ SOLN: 100.0000 mg | Freq: Every day | INTRAMUSCULAR | Status: DC

## 2017-04-25 MED ORDER — PHENOL 1.4 % MT LIQD
1.0000 | OROMUCOSAL | Status: DC | PRN
  Filled 2017-04-25 (×2): qty 177

## 2017-04-25 MED ORDER — DEXTROSE 5 % IV SOLN
2.0000 g | Freq: Two times a day (BID) | INTRAVENOUS | Status: DC
  Administered 2017-04-25: 2 g via INTRAVENOUS
  Filled 2017-04-25 (×2): qty 2

## 2017-04-25 MED ORDER — ALBUTEROL SULFATE (2.5 MG/3ML) 0.083% IN NEBU
2.5000 mg | INHALATION_SOLUTION | Freq: Four times a day (QID) | RESPIRATORY_TRACT | Status: DC
  Administered 2017-04-25: 2.5 mg via RESPIRATORY_TRACT
  Filled 2017-04-25: qty 3

## 2017-04-25 MED ORDER — ONDANSETRON HCL 4 MG PO TABS: 4.0000 mg | ORAL_TABLET | Freq: Four times a day (QID) | ORAL | Status: DC | PRN

## 2017-04-25 MED ORDER — SODIUM CHLORIDE 0.9 % IV SOLN
INTRAVENOUS | Status: DC
  Administered 2017-04-25: 20:00:00 via INTRAVENOUS
  Administered 2017-04-25: 125 mL/h via INTRAVENOUS

## 2017-04-25 MED ORDER — MORPHINE SULFATE (PF) 2 MG/ML IV SOLN
2.0000 mg | INTRAVENOUS | Status: DC | PRN
  Administered 2017-04-25: 2 mg via INTRAVENOUS
  Filled 2017-04-25: qty 1

## 2017-04-25 MED ORDER — MORPHINE SULFATE (PF) 2 MG/ML IV SOLN
2.0000 mg | INTRAVENOUS | Status: DC | PRN
  Administered 2017-04-25 (×2): 2 mg via INTRAVENOUS
  Filled 2017-04-25 (×2): qty 1

## 2017-04-25 MED ORDER — FOLIC ACID 1 MG PO TABS
1.0000 mg | ORAL_TABLET | Freq: Every day | ORAL | Status: DC
  Administered 2017-04-25: 1 mg via ORAL
  Filled 2017-04-25: qty 1

## 2017-04-25 MED ORDER — BUDESONIDE 0.25 MG/2ML IN SUSP
0.2500 mg | Freq: Two times a day (BID) | RESPIRATORY_TRACT | Status: DC
  Administered 2017-04-25 – 2017-04-26 (×3): 0.25 mg via RESPIRATORY_TRACT
  Filled 2017-04-25 (×3): qty 2

## 2017-04-25 MED ORDER — ENSURE ENLIVE PO LIQD
237.0000 mL | Freq: Two times a day (BID) | ORAL | Status: DC
  Administered 2017-04-25 – 2017-04-26 (×2): 237 mL via ORAL

## 2017-04-25 MED ORDER — VITAMIN B-1 100 MG PO TABS: 100.0000 mg | ORAL_TABLET | Freq: Every day | ORAL | Status: DC

## 2017-04-25 MED ORDER — FOLIC ACID 1 MG PO TABS
1.0000 mg | ORAL_TABLET | Freq: Every day | ORAL | Status: DC
  Administered 2017-04-26: 1 mg via ORAL
  Filled 2017-04-25: qty 1

## 2017-04-25 MED ORDER — ADULT MULTIVITAMIN W/MINERALS CH: 1.0000 | ORAL_TABLET | Freq: Every day | ORAL | Status: DC

## 2017-04-25 MED ORDER — LORAZEPAM 2 MG PO TABS: 0.0000 mg | ORAL_TABLET | Freq: Two times a day (BID) | ORAL | Status: DC

## 2017-04-25 MED ORDER — VANCOMYCIN 50 MG/ML ORAL SOLUTION
250.0000 mg | Freq: Four times a day (QID) | ORAL | Status: DC
  Administered 2017-04-25 – 2017-04-26 (×6): 250 mg via ORAL
  Filled 2017-04-25 (×8): qty 5

## 2017-04-25 MED ORDER — POTASSIUM CHLORIDE 10 MEQ/100ML IV SOLN
10.0000 meq | INTRAVENOUS | Status: AC
  Administered 2017-04-25 (×4): 10 meq via INTRAVENOUS
  Filled 2017-04-25 (×4): qty 100

## 2017-04-25 MED ORDER — IPRATROPIUM-ALBUTEROL 0.5-2.5 (3) MG/3ML IN SOLN
RESPIRATORY_TRACT | Status: AC
  Filled 2017-04-25: qty 3

## 2017-04-25 MED ORDER — LEVOTHYROXINE SODIUM 50 MCG PO TABS
100.0000 ug | ORAL_TABLET | Freq: Every day | ORAL | Status: DC
  Administered 2017-04-25 – 2017-04-26 (×2): 100 ug via ORAL
  Filled 2017-04-25: qty 1
  Filled 2017-04-25: qty 2

## 2017-04-25 MED ORDER — LORAZEPAM 2 MG/ML IJ SOLN: 1.0000 mg | Freq: Four times a day (QID) | INTRAMUSCULAR | Status: DC | PRN

## 2017-04-25 MED ORDER — ACETAMINOPHEN 325 MG PO TABS: 650.0000 mg | ORAL_TABLET | Freq: Four times a day (QID) | ORAL | Status: DC | PRN

## 2017-04-25 MED ORDER — ACETAMINOPHEN 650 MG RE SUPP: 650.0000 mg | Freq: Four times a day (QID) | RECTAL | Status: DC | PRN

## 2017-04-25 MED ORDER — IPRATROPIUM BROMIDE 0.02 % IN SOLN
0.5000 mg | Freq: Four times a day (QID) | RESPIRATORY_TRACT | Status: DC
  Administered 2017-04-25: 0.5 mg via RESPIRATORY_TRACT
  Filled 2017-04-25: qty 2.5

## 2017-04-25 MED ORDER — IPRATROPIUM-ALBUTEROL 0.5-2.5 (3) MG/3ML IN SOLN
3.0000 mL | Freq: Four times a day (QID) | RESPIRATORY_TRACT | Status: DC
  Administered 2017-04-25 – 2017-04-26 (×4): 3 mL via RESPIRATORY_TRACT
  Filled 2017-04-25 (×4): qty 3

## 2017-04-25 NOTE — Progress Notes (Signed)
[facility] NAME: Arthur Conner    MR#:  [id number]  DATE OF BIRTH:  [birth year]  SUBJECTIVE:  CHIEF COMPLAINT:   Chief Complaint  Patient presents with  . Hypotension   Generalized pain. Says he is wheezing.  Denies drinking alcohol or smoking.  Daughter met me outside and told me he smokes 4-5 packs of cigarettes a day and atleast  a pint of Shearon Stalls daily.  Transferred out of ICU. Never started on pressors. Responded to fluids  REVIEW OF SYSTEMS:    Review of Systems  Constitutional: Positive for malaise/fatigue. Negative for chills and fever.  HENT: Negative for sore throat.   Eyes: Negative for blurred vision, double vision and pain.  Respiratory: Positive for cough, sputum production and wheezing. Negative for hemoptysis and shortness of breath.   Cardiovascular: Negative for chest pain, palpitations, orthopnea and leg swelling.  Gastrointestinal: Negative for abdominal pain, constipation, diarrhea, heartburn, nausea and vomiting.  Genitourinary: Negative for dysuria and hematuria.  Musculoskeletal: Positive for myalgias. Negative for back pain and joint pain.  Skin: Negative for rash.  Neurological: Positive for weakness. Negative for sensory change, speech change, focal weakness and headaches.  Endo/Heme/Allergies: Does not bruise/bleed easily.  Psychiatric/Behavioral: Negative for depression. The patient is not nervous/anxious.     DRUG ALLERGIES:   Allergies  Allergen Reactions  . Toprol Xl [Metoprolol Tartrate] Other (See Comments)    Bradycardia    VITALS:  Blood pressure 103/67, pulse 83, temperature 97.6 F (36.4 C), temperature source Oral, resp. rate (!) 22, height _0  (1.854 m), weight 111.1 kg (245 lb), SpO2 92 %.  PHYSICAL EXAMINATION:   Physical Exam  GENERAL:  66 y.o.-year-old patient lying in the bed with no acute distress.  EYES: Pupils equal, round, reactive to light and  accommodation. No scleral icterus. Extraocular muscles intact.  HEENT: Head atraumatic, normocephalic. Oropharynx and nasopharynx clear.  NECK:  Supple, no jugular venous distention. No thyroid enlargement, no tenderness.  LUNGS: Bilateral wheezing CARDIOVASCULAR: S1, S2 normal. No murmurs, rubs, or gallops.  ABDOMEN: Soft, nontender, nondistended. Bowel sounds present. No organomegaly or mass.  EXTREMITIES: No cyanosis, clubbing or edema b/l.    NEUROLOGIC: Cranial nerves II through XII are intact. No focal Motor or sensory deficits b/l.   PSYCHIATRIC: The patient is alert and oriented x 3.  SKIN: Bruising  LABORATORY PANEL:   CBC  Recent Labs Lab 04/25/17 0643  WBC 11.6*  HGB 10.7*  HCT 31.1*  PLT 216   ------------------------------------------------------------------------------------------------------------------ Chemistries   Recent Labs Lab 04/24/17 1737  04/25/17 0643  NA 135  --  132*  K 2.7*  --  3.4*  CL 101  --  107  CO2 24  --  20*  GLUCOSE 190*  --  195*  BUN 27*  --  24*  CREATININE 2.68*  --  1.77*  CALCIUM 8.3*  --  7.4*  MG  --   < > 2.1  AST 44*  --   --   ALT 50  --   --   ALKPHOS 42  --   --   BILITOT 1.0  --   --   < > = values in this interval not displayed. ------------------------------------------------------------------------------------------------------------------  Cardiac Enzymes  Recent Labs Lab 04/25/17 0643  TROPONINI 0.04*   ------------------------------------------------------------------------------------------------------------------  RADIOLOGY:  Ct Abdomen Pelvis Wo Contrast  Result Date: 04/24/2017 CLINICAL DATA:  Hypertension.  Abdominal cramping. EXAM: CT CHEST, ABDOMEN AND PELVIS  WITHOUT CONTRAST TECHNIQUE: Multidetector CT imaging of the chest, abdomen and pelvis was performed following the standard protocol without IV contrast. COMPARISON:  Chest radiograph 04/24/2017 FINDINGS: CT CHEST FINDINGS Cardiovascular:  Normal caliber of the great vessels. Minimal calcific atherosclerotic disease of the aorta. Normal heart size. No pericardial effusion. Mediastinum/Nodes: No enlarged mediastinal, hilar, or axillary lymph nodes. Thyroid gland, trachea, and esophagus demonstrate no significant findings. Lungs/Pleura: Upper lobe predominant emphysema. Peripheral linear peribronchial opacities and mild bronchiectasis, likely represent chronic interstitial lung disease patchy ground-glass opacities also and peripheral predominance are present in both lungs. Right middle lobe volume loss . Musculoskeletal: Multilevel osteoarthritic changes. CT ABDOMEN PELVIS FINDINGS Hepatobiliary: Subcapsular area of hypoattenuation adjacent to the intersegmental fissure in the left lobe of the liver, likely represents focal fatty infiltration. No gallstones, gallbladder wall thickening, or biliary dilatation. Pancreas: Unremarkable. No pancreatic ductal dilatation or surrounding inflammatory changes. Spleen: Normal in size without focal abnormality. Adrenals/Urinary Tract: Adrenal glands are unremarkable. Kidneys are normal, without renal calculi, focal lesion, or hydronephrosis. Bladder is unremarkable. Stomach/Bowel: Stomach is within normal limits. No evidence of bowel wall thickening, distention, or inflammatory changes. Gas fluid levels within the transverse and descending colon, likely due to diarrheal state. Vascular/Lymphatic: Minimal aortic atherosclerosis. No enlarged abdominal or pelvic lymph nodes. Reproductive: Heterogeneous appearance of the prostate gland. Other: No abdominal wall hernia or abnormality. No abdominopelvic ascites. Musculoskeletal: Multilevel osteoarthritic changes of the spine. Bilateral L5 pars articularis defects. Minimal posterior listhesis of L4 on L5. IMPRESSION: Upper lobe predominant emphysema. Chronic interstitial lung disease with bronchiectasis, fibrotic changes and volume loss. Superimposed ground-glass  opacities in predominantly peripheral distribution may represent an element of acute infectious/ inflammatory changes on the background of chronic interstitial lung disease. Minimal calcific atherosclerotic disease of the aorta. No acute abnormalities within the abdomen. Heterogeneous appearance of the prostate gland. Please correlate to patient's PSA values. Electronically Signed   By: Fidela Salisbury M.D.   On: 04/24/2017 19:55   Dg Chest 1 View  Result Date: 04/24/2017 CLINICAL DATA:  Acute shortness of breath. EXAM: CHEST 1 VIEW COMPARISON:  April 21, 2017 FINDINGS: Focal infiltrate in the left mid lung and perhaps the left base. Increasing haziness over the lateral right lung base with tube removal in the interval. The cardiomediastinal silhouette is stable. No other changes. IMPRESSION: 1. Developing infiltrate in the left mid and lower lung. Recommend follow-up to resolution. 2. Possible developing effusion on the right. Electronically Signed   By: Dorise Bullion III M.D   On: 04/24/2017 19:14   Ct Chest Wo Contrast  Result Date: 04/24/2017 CLINICAL DATA:  Hypertension.  Abdominal cramping. EXAM: CT CHEST, ABDOMEN AND PELVIS WITHOUT CONTRAST TECHNIQUE: Multidetector CT imaging of the chest, abdomen and pelvis was performed following the standard protocol without IV contrast. COMPARISON:  Chest radiograph 04/24/2017 FINDINGS: CT CHEST FINDINGS Cardiovascular: Normal caliber of the great vessels. Minimal calcific atherosclerotic disease of the aorta. Normal heart size. No pericardial effusion. Mediastinum/Nodes: No enlarged mediastinal, hilar, or axillary lymph nodes. Thyroid gland, trachea, and esophagus demonstrate no significant findings. Lungs/Pleura: Upper lobe predominant emphysema. Peripheral linear peribronchial opacities and mild bronchiectasis, likely represent chronic interstitial lung disease patchy ground-glass opacities also and peripheral predominance are present in both lungs. Right  middle lobe volume loss . Musculoskeletal: Multilevel osteoarthritic changes. CT ABDOMEN PELVIS FINDINGS Hepatobiliary: Subcapsular area of hypoattenuation adjacent to the intersegmental fissure in the left lobe of the liver, likely represents focal fatty infiltration. No gallstones, gallbladder wall thickening, or biliary dilatation.  Pancreas: Unremarkable. No pancreatic ductal dilatation or surrounding inflammatory changes. Spleen: Normal in size without focal abnormality. Adrenals/Urinary Tract: Adrenal glands are unremarkable. Kidneys are normal, without renal calculi, focal lesion, or hydronephrosis. Bladder is unremarkable. Stomach/Bowel: Stomach is within normal limits. No evidence of bowel wall thickening, distention, or inflammatory changes. Gas fluid levels within the transverse and descending colon, likely due to diarrheal state. Vascular/Lymphatic: Minimal aortic atherosclerosis. No enlarged abdominal or pelvic lymph nodes. Reproductive: Heterogeneous appearance of the prostate gland. Other: No abdominal wall hernia or abnormality. No abdominopelvic ascites. Musculoskeletal: Multilevel osteoarthritic changes of the spine. Bilateral L5 pars articularis defects. Minimal posterior listhesis of L4 on L5. IMPRESSION: Upper lobe predominant emphysema. Chronic interstitial lung disease with bronchiectasis, fibrotic changes and volume loss. Superimposed ground-glass opacities in predominantly peripheral distribution may represent an element of acute infectious/ inflammatory changes on the background of chronic interstitial lung disease. Minimal calcific atherosclerotic disease of the aorta. No acute abnormalities within the abdomen. Heterogeneous appearance of the prostate gland. Please correlate to patient's PSA values. Electronically Signed   By: Fidela Salisbury M.D.   On: 04/24/2017 19:55     ASSESSMENT AND PLAN:    * Sepsis due to C diff On PO vancomycin Stop cefepime and Vancomycin Diarrhea  improving. IVF  * Acute COPD exacerbation -IV steroids - Scheduled Nebulizers - Inhalers -Wean O2 as tolerated Stopped abx as there is no PNA on CT scan    Essential hypertension - hold antihypertensives for now as the patient's blood pressure is low  Acute kidney injury due to dehydration and ATN Improving with IVF    Hypothyroidism - home dose thyroid replacement  All the records are reviewed and case discussed with Care Management/Social Worker Management plans discussed with the patient, family and they are in agreement.  CODE STATUS: FULL CODE  DVT Prophylaxis: SCDs  TOTAL TIME TAKING CARE OF THIS PATIENT: 35 minutes.   POSSIBLE D/C IN 1-2 DAYS, DEPENDING ON CLINICAL CONDITION.  Hillary Bow R M.D on 04/25/2017 at 12:49 PM  Between 7am to 6pm - Pager - [phone]  After 6pm go to [email or web address] - password [email or web address] [facility]  Office  [phone]  CC: Primary care physician; Guadalupe Maple, MD  Note: This dictation was prepared with Dragon dictation along with smaller phrase technology. Any transcriptional errors that result from this process are unintentional.

## 2017-04-25 NOTE — Progress Notes (Signed)
Pharmacy Antibiotic Note  Arthur AversJoseph L Conner is a 66 y.o. male admitted on 04/24/2017 with pneumonia.  Pharmacy has been consulted for vancomycin and cefepime dosing.  Plan: DW 92kg  Vd 64L kei 0.034 hr-1  T1/2 20 hours Vancomycin 1250 mg q 24 hours ordered with stacked dosing. Level before 4th dose due to decreased renal function to monitor for accumulation. Goal trough 15-20.  Cefepime 2 grams q 12 hours ordered for HCAP.    Height: 6\' 1"  (185.4 cm) Weight: 245 lb (111.1 kg) IBW/kg (Calculated) : 79.9  Temp (24hrs), Avg:97.8 F (36.6 C), Min:97.8 F (36.6 C), Max:97.8 F (36.6 C)   Recent Labs Lab 04/18/17 0405 04/19/17 0524 04/21/17 0506 04/24/17 1737 04/24/17 1820 04/24/17 2217  WBC 6.9 8.8 10.1 14.9*  --   --   CREATININE 0.65 0.76 0.61 2.68*  --   --   LATICACIDVEN  --   --   --   --  3.8* 1.9    Estimated Creatinine Clearance: 35.9 mL/min (A) (by C-G formula based on SCr of 2.68 mg/dL (H)).    Allergies  Allergen Reactions  . Toprol Xl [Metoprolol Tartrate] Other (See Comments)    Bradycardia    Antimicrobials this admission: Vancomycin, cefepime, metronidazole 6/17 >>    >>   Dose adjustments this admission:   Microbiology results: 6/17 BCx: pending 6/17 UCx: pending  6/17 MRSA PCR: pending      6/17 CXR: L mid and lower lung infiltrate 6/17 UA: pending Thank you for allowing pharmacy to be a part of this patient's care.  Arthur Conner S 04/25/2017 1:54 AM

## 2017-04-25 NOTE — Plan of Care (Signed)
MD aware of troponin

## 2017-04-25 NOTE — Consult Note (Signed)
PULMONARY / CRITICAL CARE MEDICINE   Name: Arthur Conner MRN: [id number] DOB: [birth year]    ADMISSION DATE:  04/24/2017   CONSULTATION DATE:  04/24/2017  REFERRING MD:  Dr. Jannifer Franklin  REASON: ICU MANAGEMENT OF SEPTIC SHOCK  CHIEF COMPLAINT:  Multiple falls and hypotension  HISTORY OF PRESENT ILLNESS: This is a 66 year old Caucasian male with a past medical history as indicated below, who was recently hospitalized for alcohol withdrawal, hyponatremia, and right sided pneumothorax status post chest tube placement and removal and discharged to rehabilitation, who presented to the ED with complaints of multiple falls and hypertension. History is obtained from ED records, patient's family and from patient. He does not have a good recollection of the events that precipitated this hospitalization. However, he does recall that he had a copious amount of diarrhea yesterday morning, after which he got really sick. The patient's daughter, patient was confused and had multiple falls and large amounts of loose greenish stools. The rehabilitation center indicated that his blood pressure was low and so she decided to call EMS. When EMS arrived, patient's blood pressure was 60/40 mmHg. He was also complaining of abdominal cramping. He was started on IV fluids and transferred to the ED. Patient is also complaining of a productive cough and multiple bruises which he attributes to falls. He is currently alert and complaining of generalized pain and weakness. He denies chest pain, palpitations, nausea, vomiting but reports wheezing, sore throat and sputum production. His ED workup showed severe hypokalemia, acute kidney injury, lactic acidosis and leukocytosis. He was ruled in for sepsis. PCCM was consulted for further management  PAST MEDICAL HISTORY :  He  has a past medical history of Anxiety; Asthma; Depression; Gout; and Hypertension.  PAST SURGICAL HISTORY: He  has a past surgical history that includes  Appendectomy and Tonsillectomy.  Allergies  Allergen Reactions  . Toprol Xl [Metoprolol Tartrate] Other (See Comments)    Bradycardia    No current facility-administered medications on file prior to encounter.    Current Outpatient Prescriptions on File Prior to Encounter  Medication Sig  . albuterol (PROVENTIL) (2.5 MG/3ML) 0.083% nebulizer solution INHALE CONTENTS OF 1 VIAL BY MOUTH THROUGH NEBULIZER EVERY 4 TO 6 HOURS AS NEEDED FOR SHORTNESS OF BREATH.  Marland Kitchen allopurinol (ZYLOPRIM) 300 MG tablet TAKE 1 TABLET BY MOUTH ONCE DAILY  . amLODipine (NORVASC) 10 MG tablet TAKE 1 TABLET BY MOUTH ONCE DAILY  . budesonide-formoterol (SYMBICORT) 160-4.5 MCG/ACT inhaler Inhale 2 puffs into the lungs 2 (two) times daily.  . cetirizine (ZYRTEC) 10 MG tablet once daily.  Marland Kitchen doxycycline (VIBRA-TABS) 100 MG tablet Take 1 tablet (100 mg total) by mouth every 12 (twelve) hours.  . feeding supplement, ENSURE ENLIVE, (ENSURE ENLIVE) LIQD Take 237 mLs by mouth 2 (two) times daily between meals.  . hydrochlorothiazide (HYDRODIURIL) 25 MG tablet Take 1 tablet (25 mg total) by mouth daily.  Marland Kitchen levothyroxine (SYNTHROID, LEVOTHROID) 100 MCG tablet TAKE 1 TABLET BY MOUTH ONCE DAILY ON AN EMPTY STOMACH. WAIT 30 MINUTES BEFORE TAKING OTHER MEDS.  Marland Kitchen PROVENTIL HFA 108 (90 Base) MCG/ACT inhaler INHALE 2 PUFFS BY MOUTH EVERY 6 HOURS AS NEEDED FOR WHEEZING OR SHORTNESS OF BREATH.  . valsartan (DIOVAN) 320 MG tablet Take 320 mg by mouth daily.  Marland Kitchen losartan (COZAAR) 100 MG tablet Take 1 tablet (100 mg total) by mouth daily. (Patient not taking: Reported on 04/14/2017)    FAMILY HISTORY:  His indicated that his mother is deceased. He indicated that his father is  deceased. He indicated that both of his sisters are alive. He indicated that his maternal grandmother is deceased. He indicated that his maternal grandfather is deceased. He indicated that his paternal grandmother is deceased. He indicated that his paternal grandfather is  deceased. He indicated that his daughter is alive. He indicated that his son is alive.    SOCIAL HISTORY: He  reports that he has been smoking Cigarettes.  He has been smoking about 0.50 packs per day. He has never used smokeless tobacco. He reports that he drinks alcohol. He reports that he does not use drugs.  REVIEW OF SYSTEMS:   Constitutional: Positive for fever, chills and generalized malaise.  HENT: Negative for congestion and rhinorrhea.  Eyes: Negative for redness and visual disturbance.  Respiratory: Negative for shortness of breath , but positive for wheezing.  Cardiovascular: Negative for chest pain and palpitations.  Gastrointestinal: Positive  for nausea, abdominal pain and loose stools Genitourinary: Negative for dysuria and urgency.  Endocrine: Denies polyuria, polyphagia and heat intolerance Musculoskeletal: Negative for myalgias and arthralgias.  Skin: Negative for pallor and wound.  Neurological: Negative for headaches but positive for generalized weakness and dizziness  SUBJECTIVE:   VITAL SIGNS: BP 102/70   Pulse 96   Temp 97.8 F (36.6 C) (Oral)   Resp (!) 27   Ht 6' 1"  (1.854 m)   Wt 245 lb (111.1 kg)   SpO2 93%   BMI 32.32 kg/m   HEMODYNAMICS:    VENTILATOR SETTINGS:    INTAKE / OUTPUT: No intake/output data recorded.  PHYSICAL EXAMINATION: General: Acutely ill-looking, older for age Neuro:  Alert and oriented to person, place, time and situation. No focal deficits HEENT:  PERRLA, oral mucosa dry Cardiovascular:  Tachycardic, regular, S1, S2 audible. No murmur, regurg or gallop, +2 pulses bilaterally, no edema Lungs: Bilateral adventitious breath sounds., Diminished in the left lower and middle lobes and right lower lobe, positive Rales in bilateral lower lobe, positive rhonchi, in mild expiratory wheezes Abdomen:  Nondistended, positive bowel sounds in all 4 quadrants, palpation reveals no organomegaly Musculoskeletal:  No visible  deformities, positive range of motion in upper and lower extremities, unable to assess gait Skin: Warm and dry, multiple bruises and ecchymotic areas in lower extremities, arms and torso, mostly in the lower back  LABS:  BMET  Recent Labs Lab 04/19/17 0524 04/21/17 0506 04/24/17 1737  NA 134* 133* 135  K 4.1 4.6 2.7*  CL 97* 95* 101  CO2 28 30 24   BUN 18 20 27*  CREATININE 0.76 0.61 2.68*  GLUCOSE 181* 168* 190*    Electrolytes  Recent Labs Lab 04/19/17 0524 04/21/17 0506 04/24/17 1737 04/24/17 1857  CALCIUM 8.7* 8.7* 8.3*  --   MG 2.2 1.8  --  1.8  PHOS 3.5 4.6  --   --     CBC  Recent Labs Lab 04/19/17 0524 04/21/17 0506 04/24/17 1737  WBC 8.8 10.1 14.9*  HGB 11.9* 12.0* 12.5*  HCT 33.9* 34.4* 36.1*  PLT 209 230 260    Coag's No results for input(s): APTT, INR in the last 168 hours.  Sepsis Markers  Recent Labs Lab 04/19/17 0524 04/21/17 0506 04/24/17 1820 04/24/17 2217  LATICACIDVEN  --   --  3.8* 1.9  PROCALCITON <0.10 <0.10  --   --     ABG  Recent Labs Lab 04/18/17 1030  PHART 7.43  PCO2ART 45  PO2ART 61*    Liver Enzymes  Recent Labs Lab 04/19/17 0524 04/24/17  1737  AST 21 44*  ALT 44 50  ALKPHOS 46 42  BILITOT 0.9 1.0  ALBUMIN 3.0* 3.1*    Cardiac Enzymes  Recent Labs Lab 04/24/17 1737  TROPONINI 0.05*    Glucose  Recent Labs Lab 04/20/17 2146 04/21/17 0736 04/21/17 1144 04/21/17 1713 04/21/17 2150 04/22/17 0732  GLUCAP 161* 178* 175* 198* 180* 184*    Imaging Ct Abdomen Pelvis Wo Contrast  Result Date: 04/24/2017 CLINICAL DATA:  Hypertension.  Abdominal cramping. EXAM: CT CHEST, ABDOMEN AND PELVIS WITHOUT CONTRAST TECHNIQUE: Multidetector CT imaging of the chest, abdomen and pelvis was performed following the standard protocol without IV contrast. COMPARISON:  Chest radiograph 04/24/2017 FINDINGS: CT CHEST FINDINGS Cardiovascular: Normal caliber of the great vessels. Minimal calcific atherosclerotic  disease of the aorta. Normal heart size. No pericardial effusion. Mediastinum/Nodes: No enlarged mediastinal, hilar, or axillary lymph nodes. Thyroid gland, trachea, and esophagus demonstrate no significant findings. Lungs/Pleura: Upper lobe predominant emphysema. Peripheral linear peribronchial opacities and mild bronchiectasis, likely represent chronic interstitial lung disease patchy ground-glass opacities also and peripheral predominance are present in both lungs. Right middle lobe volume loss . Musculoskeletal: Multilevel osteoarthritic changes. CT ABDOMEN PELVIS FINDINGS Hepatobiliary: Subcapsular area of hypoattenuation adjacent to the intersegmental fissure in the left lobe of the liver, likely represents focal fatty infiltration. No gallstones, gallbladder wall thickening, or biliary dilatation. Pancreas: Unremarkable. No pancreatic ductal dilatation or surrounding inflammatory changes. Spleen: Normal in size without focal abnormality. Adrenals/Urinary Tract: Adrenal glands are unremarkable. Kidneys are normal, without renal calculi, focal lesion, or hydronephrosis. Bladder is unremarkable. Stomach/Bowel: Stomach is within normal limits. No evidence of bowel wall thickening, distention, or inflammatory changes. Gas fluid levels within the transverse and descending colon, likely due to diarrheal state. Vascular/Lymphatic: Minimal aortic atherosclerosis. No enlarged abdominal or pelvic lymph nodes. Reproductive: Heterogeneous appearance of the prostate gland. Other: No abdominal wall hernia or abnormality. No abdominopelvic ascites. Musculoskeletal: Multilevel osteoarthritic changes of the spine. Bilateral L5 pars articularis defects. Minimal posterior listhesis of L4 on L5. IMPRESSION: Upper lobe predominant emphysema. Chronic interstitial lung disease with bronchiectasis, fibrotic changes and volume loss. Superimposed ground-glass opacities in predominantly peripheral distribution may represent an element  of acute infectious/ inflammatory changes on the background of chronic interstitial lung disease. Minimal calcific atherosclerotic disease of the aorta. No acute abnormalities within the abdomen. Heterogeneous appearance of the prostate gland. Please correlate to patient's PSA values. Electronically Signed   By: Fidela Salisbury M.D.   On: 04/24/2017 19:55   Dg Chest 1 View  Result Date: 04/24/2017 CLINICAL DATA:  Acute shortness of breath. EXAM: CHEST 1 VIEW COMPARISON:  April 21, 2017 FINDINGS: Focal infiltrate in the left mid lung and perhaps the left base. Increasing haziness over the lateral right lung base with tube removal in the interval. The cardiomediastinal silhouette is stable. No other changes. IMPRESSION: 1. Developing infiltrate in the left mid and lower lung. Recommend follow-up to resolution. 2. Possible developing effusion on the right. Electronically Signed   By: Dorise Bullion III M.D   On: 04/24/2017 19:14   Ct Chest Wo Contrast  Result Date: 04/24/2017 CLINICAL DATA:  Hypertension.  Abdominal cramping. EXAM: CT CHEST, ABDOMEN AND PELVIS WITHOUT CONTRAST TECHNIQUE: Multidetector CT imaging of the chest, abdomen and pelvis was performed following the standard protocol without IV contrast. COMPARISON:  Chest radiograph 04/24/2017 FINDINGS: CT CHEST FINDINGS Cardiovascular: Normal caliber of the great vessels. Minimal calcific atherosclerotic disease of the aorta. Normal heart size. No pericardial effusion. Mediastinum/Nodes: No enlarged mediastinal,  hilar, or axillary lymph nodes. Thyroid gland, trachea, and esophagus demonstrate no significant findings. Lungs/Pleura: Upper lobe predominant emphysema. Peripheral linear peribronchial opacities and mild bronchiectasis, likely represent chronic interstitial lung disease patchy ground-glass opacities also and peripheral predominance are present in both lungs. Right middle lobe volume loss . Musculoskeletal: Multilevel osteoarthritic  changes. CT ABDOMEN PELVIS FINDINGS Hepatobiliary: Subcapsular area of hypoattenuation adjacent to the intersegmental fissure in the left lobe of the liver, likely represents focal fatty infiltration. No gallstones, gallbladder wall thickening, or biliary dilatation. Pancreas: Unremarkable. No pancreatic ductal dilatation or surrounding inflammatory changes. Spleen: Normal in size without focal abnormality. Adrenals/Urinary Tract: Adrenal glands are unremarkable. Kidneys are normal, without renal calculi, focal lesion, or hydronephrosis. Bladder is unremarkable. Stomach/Bowel: Stomach is within normal limits. No evidence of bowel wall thickening, distention, or inflammatory changes. Gas fluid levels within the transverse and descending colon, likely due to diarrheal state. Vascular/Lymphatic: Minimal aortic atherosclerosis. No enlarged abdominal or pelvic lymph nodes. Reproductive: Heterogeneous appearance of the prostate gland. Other: No abdominal wall hernia or abnormality. No abdominopelvic ascites. Musculoskeletal: Multilevel osteoarthritic changes of the spine. Bilateral L5 pars articularis defects. Minimal posterior listhesis of L4 on L5. IMPRESSION: Upper lobe predominant emphysema. Chronic interstitial lung disease with bronchiectasis, fibrotic changes and volume loss. Superimposed ground-glass opacities in predominantly peripheral distribution may represent an element of acute infectious/ inflammatory changes on the background of chronic interstitial lung disease. Minimal calcific atherosclerotic disease of the aorta. No acute abnormalities within the abdomen. Heterogeneous appearance of the prostate gland. Please correlate to patient's PSA values. Electronically Signed   By: Fidela Salisbury M.D.   On: 04/24/2017 19:55    STUDIES:  None  CULTURES: C-diff POSITIVE Blood cultures 2 Throat culture  ANTIBIOTICS: Flagyl 04/24/2017 Oral vancomycin 04/24/2017 IV vancomycin 04/24/2017 Cefepime  04/24/2017  SIGNIFICANT EVENTS: 06/15> discharge to rehabilitation 04/24/2017, readmitted with C. difficile colitis, septic shock and healthcare acquired pneumonia  LINES/TUBES: PIVs  DISCUSSION:  66 year old Caucasian male presenting with septic shock, C. difficile colitis, healthcare acquired pneumonia, and sepsis.  ASSESSMENT  Septic shock Sepsis HCAP C-diff colitis Hypokalemia Acute asthma exacerbation. Multiple falls History of EtOH abuse History of depression and anxiety. History of gout  PLAN Hemodynamic monitoring per ICU protocol IV fluid resuscitation IV antibiotics as above. Follow-up cultures Monitor and replace electrolytes Nebulized steroids and bronchodilators Systemic steroids. Time in folic acid and multivitamin daily. Trend pro-calcitonin Trend lactic acid Chest x-ray and ABG as needed. Pressors to maintain mean arterial blood pressure 65 and above. GI and DVT prophylaxis Further changes in treatment plan pending patient's clinical course and diagnostics FAMILY  - Updates: Plan of care discussed extensively at length with patient, patient's sister and patient's daughter at bedside. Family met with me separately and expressed concerns about patient's prognosis, as well as overall discharge disposition. Patient's current health status reviewed at length with patient's sister and daughter, the concerns regarding patient's alcohol use addressed and all patients onset and support provided  - Inter-disciplinary family meet or Palliative Care meeting due by:  day 7  Magdalene S. [facility] ANP-BC Pulmonary and [place] Pager [phone] or [phone] 04/25/2017, 12:11 AM   PCCM ATTENDING ATTESTATION:  I have evaluated patient with the APP [place], reviewed database in its entirety and discussed care plan in detail. In addition, this patient was discussed on multidisciplinary rounds.   Important exam findings: No distress  on nasal cannula oxygen Cognition intact No wheezes Few bibasilar crackles Regular, no murmurs Abdomen soft, nontender  Extremities without edema   Major problems addressed by PCCM team: Hypotension, resolved Severe sepsis Possible pneumonia - I am unimpressed with the x-ray findings. They could represent mild acute lung injury C. difficile colitis - this is likely his principal infection and cause of sepsis   PLAN/REC: Continue vancomycin and cefepime for now. These can probably be discontinued if respiratory cultures and PCT are negative Continue oral vancomycin for C. difficile colitis Transfer to MedSurg floor  After transfer,PCCM will sign off. Please call if we can be of further assistance   Merton Border, MD PCCM service Mobile [phone] Pager [phone] 04/25/2017 11:57 AM

## 2017-04-25 NOTE — Plan of Care (Signed)
Problem: Pain Managment: Goal: General experience of comfort will improve Outcome: Progressing Pt c/o generalized pain, morphine given once with improvement.  Problem: Physical Regulation: Goal: Ability to maintain clinical measurements within normal limits will improve Outcome: Progressing Pt transferred today from ICU. VSS. Dyspnea with exertion. Continue on 2L O2 per [place].   Problem: Bowel/Gastric: Goal: Will not experience complications related to bowel motility Outcome: Progressing No BM since transferred to the unit.

## 2017-04-25 NOTE — Progress Notes (Signed)
Initial Nutrition Assessment  DOCUMENTATION CODES:   Obesity unspecified  INTERVENTION:  1. Ensure Enlive po BID, each supplement provides 350 kcal and 20 grams of protein 2. Magic cup TID with meals, each supplement provides 290 kcal and 9 grams of protein Continue MVI w/ Minerals  NUTRITION DIAGNOSIS:   Inadequate oral intake related to acute illness (ETOH abuse, PNA) as evidenced by per patient/family report, other (see comment) (skipping meals).  GOAL:   Patient will meet greater than or equal to 90% of their needs  MONITOR:   PO intake, I & O's, Supplement acceptance, Labs, Weight trends  REASON FOR ASSESSMENT:   Malnutrition Screening Tool    ASSESSMENT:   Arthur Conner  is a 66 y.o. male who presents with Malaise, cough, fever. Patient was recently discharged from the hospital, and spent a few days in rehabilitation before having to come back tonight with fever  Mr. Philis KendallSizemore returns from rehab with fever. PO intake is still limited. Patient reports being fearful to eat due to hypotension. Daughter reports poor PO intake x6 weeks with 50# wt loss. Per chart, patient exhibits a 6#/2.2% insignificant wt loss over 1.5 weeks. Patient previously admitted to ICU. Ordered dinner for him. Patient agreeable to consuming magic cup and ensure again.  Nutrition-Focused physical exam completed. Findings are no fat depletion, no muscle depletion, and no edema.  Labs and medications reviewed: Na 132, K 3.4, CBG 206 Prednisone, Thiamine, MVI w/ Minerals, Folic Acid NS @ 4650mL/hr  Diet Order:  Diet Heart Room service appropriate? Yes; Fluid consistency: Thin  Skin:  Wound (see comment) (Closed incision to chest)  Last BM:  04/24/2017  Height:   Ht Readings from Last 1 Encounters:  04/25/17 6\' 1"  (1.854 m)    Weight:   Wt Readings from Last 1 Encounters:  04/25/17 257 lb 8 oz (116.8 kg)    Ideal Body Weight:  83.63 kg  BMI:  Body mass index is 33.97 kg/m.  Estimated  Nutritional Needs:   Kcal:  2400-2700 calories  Protein:  120-145 grams  Fluid:  441ml/kcal/day  EDUCATION NEEDS:   Education needs no appropriate at this time  [name], MS, RD LDN Inpatient Clinical Dietitian Pager [phone]

## 2017-04-25 NOTE — Progress Notes (Signed)
Attempted condom cath on patient and it wouldn't stay on. Instructed patient to call out to staff each time he uses the urinal. Patient currently receiving IV potassium. On second bag the patient stated his arm was burning a bit. Rate turned down and combined with a NS running IV. Patient tolerating well.

## 2017-04-26 LAB — BASIC METABOLIC PANEL
Anion gap: 7 (ref 5–15)
BUN: 20 mg/dL (ref 6–20)
CO2: 22 mmol/L (ref 22–32)
CREATININE: 1.17 mg/dL (ref 0.61–1.24)
Calcium: 8 mg/dL — ABNORMAL LOW (ref 8.9–10.3)
Chloride: 108 mmol/L (ref 101–111)
GFR calc Af Amer: >60 mL/min (ref >60)
GLUCOSE: 176 mg/dL — AB (ref 65–99)
POTASSIUM: 3.7 mmol/L (ref 3.5–5.1)
SODIUM: 137 mmol/L (ref 135–145)

## 2017-04-26 LAB — CBC
HEMATOCRIT: 30.4 % — AB (ref 40.0–52.0)
Hemoglobin: 10.3 g/dL — ABNORMAL LOW (ref 13.0–18.0)
MCH: 33.2 pg (ref 26.0–34.0)
MCHC: 33.9 g/dL (ref 32.0–36.0)
MCV: 97.9 fL (ref 80.0–100.0)
PLATELETS: 207 10*3/uL (ref 150–440)
RBC: 3.1 MIL/uL — ABNORMAL LOW (ref 4.40–5.90)
RDW: 16.6 % — AB (ref 11.5–14.5)
WBC: 14.5 10*3/uL — ABNORMAL HIGH (ref 3.8–10.6)

## 2017-04-26 LAB — PROCALCITONIN

## 2017-04-26 LAB — URINE CULTURE: Culture: NO GROWTH

## 2017-04-26 MED ORDER — MOMETASONE FURO-FORMOTEROL FUM 100-5 MCG/ACT IN AERO
2.0000 | INHALATION_SPRAY | Freq: Two times a day (BID) | RESPIRATORY_TRACT | Status: DC
  Filled 2017-04-26: qty 8.8

## 2017-04-26 MED ORDER — PREDNISONE 10 MG PO TABS: ORAL_TABLET | ORAL | 0 refills | Status: DC

## 2017-04-26 MED ORDER — GUAIFENESIN-DM 100-10 MG/5ML PO SYRP
5.0000 mL | ORAL_SOLUTION | ORAL | Status: DC | PRN
  Administered 2017-04-26: 02:00:00 5 mL via ORAL
  Filled 2017-04-26: qty 5

## 2017-04-26 MED ORDER — VANCOMYCIN 50 MG/ML ORAL SOLUTION: 250.0000 mg | Freq: Four times a day (QID) | ORAL | 0 refills | Status: AC

## 2017-04-26 MED ORDER — ALBUTEROL SULFATE HFA 108 (90 BASE) MCG/ACT IN AERS: 1.0000 | INHALATION_SPRAY | Freq: Four times a day (QID) | RESPIRATORY_TRACT | Status: DC | PRN

## 2017-04-26 MED ORDER — VALSARTAN 160 MG PO TABS: 160.0000 mg | ORAL_TABLET | Freq: Every day | ORAL | 0 refills | Status: AC

## 2017-04-26 MED ORDER — ADULT MULTIVITAMIN W/MINERALS CH: 1.0000 | ORAL_TABLET | Freq: Every day | ORAL | 0 refills | Status: AC

## 2017-04-26 MED ORDER — LORAZEPAM 0.5 MG PO TABS: 0.5000 mg | ORAL_TABLET | Freq: Three times a day (TID) | ORAL | 0 refills | Status: AC | PRN

## 2017-04-26 NOTE — Progress Notes (Addendum)
Please see full assessment from recent admission and discharge.  Patient quick readmission 24-28 hours. Return to [facility] today. Call placed to admissions and agreeable for return.   All discharge paperwork sent to facility.  Daughter in room and upset as patient is refusing to go to rehab and wanting to go home. She reports he is going through DTs, however he has been at a nursing facility thus he should not have been drinking and was detox last week when he was here.   Daughter reports she cannot take him home at this time in his condition and is fearful he will die if he does go home because of his chronic alcohol dependence.  Daughter reports her family has left her to make the decision and she does not know what to do. Daughter educated patient can return to SNF, however it is illegal to force him and he has a right to leave if he chooses as these facilties are not locked.  MD is aware of situation and working to speak to daughter. Daughter is hopeful patient will discharge to SNF in time to get house prepared to come home. Patient is his own guardian and able to make own decisions. He is impulsive and angry at this time with being made to go to rehab when he wants to go home. Daughter is asking to speak to MD in which has been completed and have PRN ativan at discharge. This has been completed. No barriers at this time.  Deretha EmoryHannah Tiajuana Leppanen LCSW, MSW Clinical Social Work: Optician, dispensingystem [employer] Coverage for :  [phone]

## 2017-04-26 NOTE — Progress Notes (Signed)
Pt is being discharged to [facility]. Report [facility] to [facility] RN. AVS given to pt and 2nd copy placed in discharge packet. Discharge instructions,meds and appointments reviewed with pt and daughter,both verbalized understanding. Awaiting EMS.

## 2017-04-26 NOTE — [place] FL2 (Signed)
[place] MEDICAID FL2 LEVEL OF CARE SCREENING TOOL     IDENTIFICATION  Patient Name: Arthur Conner Birthdate: September 05, 1951 Sex: male Admission Date (Current Location): 04/24/2017  [facility] [place]   Facility and Address:  [facility], [address], [place], [place] [address]      Provider Number: [id number]  Attending Physician Name and Address:  Enedina FinnerPatel, Sona, MD  Relative Name and Phone Number:       Current Level of Care: Hospital Recommended Level of Care: Skilled Nursing Facility Prior Approval Number:    Date Approved/Denied:   PASRR Number:    Discharge Plan: SNF    Current Diagnoses: Patient Active Problem List   Diagnosis Date Noted  . Sepsis (HCC) 04/24/2017  . HCAP (healthcare-associated pneumonia) 04/24/2017  . Alcohol use 04/24/2017  . Delirium tremens (HCC) 04/19/2017  . Subacute delirium 04/19/2017  . COPD (chronic obstructive pulmonary disease) (HCC) 04/18/2017  . Hyponatremia 04/14/2017  . Asthmatic bronchitis 10/31/2015  . Hypothyroidism 08/12/2015  . Asthma exacerbation 07/29/2015  . Essential hypertension 04/24/2015    Orientation RESPIRATION BLADDER Height & Weight     Self, Time, Situation, Place  O2 (3L) Incontinent Weight: 257 lb 8 oz (116.8 kg) Height:  6\' 1"  (185.4 cm)  BEHAVIORAL SYMPTOMS/MOOD NEUROLOGICAL BOWEL NUTRITION STATUS      Incontinent Diet (See Summary)  AMBULATORY STATUS COMMUNICATION OF NEEDS Skin   Extensive Assist Verbally Normal                       Personal Care Assistance Level of Assistance  Bathing, Feeding, Dressing Bathing Assistance: Limited assistance Feeding assistance: Limited assistance Dressing Assistance: Limited assistance     Functional Limitations Info  Sight, Hearing, Speech Sight Info: Adequate Hearing Info: Adequate Speech Info: Adequate    SPECIAL CARE FACTORS FREQUENCY  PT (By licensed PT), OT (By licensed OT)     PT  Frequency: 5x OT Frequency: 5x            Contractures Contractures Info: Not present    Additional Factors Info  Code Status, Allergies, Isolation Precautions Code Status Info: Full Code Allergies Info: Toprol Xl Metoprolol Tartrate     Isolation Precautions Info: Enteric pre     Current Medications (04/26/2017):  This is the current hospital active medication list Current Facility-Administered Medications  Medication Dose Route Frequency Provider Last Rate Last Dose  . acetaminophen (TYLENOL) tablet 650 mg  650 mg Oral Q6H PRN Oralia ManisWillis, David, MD       Or  . acetaminophen (TYLENOL) suppository 650 mg  650 mg Rectal Q6H PRN Oralia ManisWillis, David, MD      . budesonide (PULMICORT) nebulizer solution 0.25 mg  0.25 mg Nebulization Q12H Tukov, Magadalene S, NP   0.25 mg at 04/26/17 0725  . feeding supplement (ENSURE ENLIVE) (ENSURE ENLIVE) liquid 237 mL  237 mL Oral BID BM Sudini, Wardell HeathSrikar, MD   237 mL at 04/25/17 1523  . folic acid (FOLVITE) tablet 1 mg  1 mg Oral Daily Oralia ManisWillis, David, MD   1 mg at 04/26/17 [id number]  . guaiFENesin-dextromethorphan (ROBITUSSIN DM) 100-10 MG/5ML syrup 5 mL  5 mL Oral Q4H PRN Milagros LollSudini, Srikar, MD   5 mL at 04/26/17 0132  . heparin injection 5,000 Units  5,000 Units Subcutaneous Q8H Oralia ManisWillis, David, MD   5,000 Units at 04/26/17 0422  . ipratropium-albuterol (DUONEB) 0.5-2.5 (3) MG/3ML nebulizer solution 3 mL  3 mL Nebulization Q6H Christy, Scott D,  RPH   3 mL at 04/26/17 0725  . levothyroxine (SYNTHROID, LEVOTHROID) tablet 100 mcg  100 mcg Oral QAC breakfast Oralia Manis, MD   100 mcg at 04/26/17 1610  . LORazepam (ATIVAN) tablet 1 mg  1 mg Oral Q6H PRN Oralia Manis, MD   1 mg at 04/25/17 2338   Or  . LORazepam (ATIVAN) injection 1 mg  1 mg Intravenous Q6H PRN Oralia Manis, MD      . LORazepam (ATIVAN) tablet 0-4 mg  0-4 mg Oral Q6H Oralia Manis, MD       Followed by  . [START ON 04/27/2017] LORazepam (ATIVAN) tablet 0-4 mg  0-4 mg Oral Q12H Oralia Manis, MD      .  MEDLINE mouth rinse  15 mL Mouth Rinse BID Milagros Loll, MD   15 mL at 04/26/17 0825  . multivitamin with minerals tablet 1 tablet  1 tablet Oral Daily Marylou Flesher S, NP   1 tablet at 04/26/17 9604  . ondansetron (ZOFRAN) tablet 4 mg  4 mg Oral Q6H PRN Oralia Manis, MD       Or  . ondansetron [facility]) injection 4 mg  4 mg Intravenous Q6H PRN Oralia Manis, MD      . phenol (CHLORASEPTIC) mouth spray 1 spray  1 spray Mouth/Throat PRN Marylou Flesher S, NP      . predniSONE (DELTASONE) tablet 50 mg  50 mg Oral Q breakfast Milagros Loll, MD   50 mg at 04/26/17 0821  . thiamine (B-1) injection 100 mg  100 mg Intravenous Daily Oralia Manis, MD      . thiamine (VITAMIN B-1) tablet 100 mg  100 mg Oral Daily Marylou Flesher S, NP   100 mg at 04/26/17 5409  . vancomycin (VANCOCIN) 50 mg/mL oral solution 250 mg  250 mg Oral Q6H Oralia Manis, MD   250 mg at 04/26/17 8119     Discharge Medications: Please see discharge summary for a list of discharge medications.  Relevant Imaging Results:  Relevant Lab Results:   Additional Information SSN:  [id number]  Raye Sorrow, [place]

## 2017-04-26 NOTE — Discharge Summary (Addendum)
[facility] [place] [facility]   PATIENT NAME: Arthur Conner    MR#:  [id number]  DATE OF BIRTH:  21-Nov-1950  DATE OF ADMISSION:  04/24/2017 ADMITTING PHYSICIAN: Oralia Manis, MD  DATE OF DISCHARGE: 04/26/2017  PRIMARY CARE PHYSICIAN: Steele Sizer, MD    ADMISSION DIAGNOSIS:  Severe sepsis with septic shock (HCC) [A41.9, R65.21] AKI (acute kidney injury) (HCC) [N17.9] HCAP (healthcare-associated pneumonia) [J18.9]  DISCHARGE DIAGNOSIS:   Sepsis present on admission-resolved Acute renal failure resolved C difficile colitis COPD  SECONDARY DIAGNOSIS:   Past Medical History:  Diagnosis Date  . Anxiety   . Asthma   . Depression   . Gout   . Hypertension     HOSPITAL COURSE:  Arthur Conner  is a 66 y.o. male who presents with Malaise, cough, fever. Patient was recently discharged from the hospital, and spent a few days in rehabilitation before having to come back tonight with fever  *Sepsis due to C diff On PO vancomycin Diarrhea improving. Received IVF  * Acute COPD exacerbation -IV steroids - Scheduled Nebulizers - Inhalers -Wean O2 as tolerated. sats 90-92% on RA. Prn oxygen at rehab Stopped abx as there is no PNA on CT scan  *Essential hypertension  -resumed amlodipine and valsartan (low dose) and d/ed HCTZ  *Acute kidney injury due to dehydration and ATN---from GI losses Improved with IVF  *Hypothyroidism - home dose thyroid replacement  *h/o chronic alcoholism with anxiety -prn ativan (only 15 tabs rxed) d/w dter who is requesting ativan to help him stay calm.  Overall improving and ok to d/c to rehab today D/w son Arthur Conner  CONSULTS OBTAINED:    DRUG ALLERGIES:   Allergies  Allergen Reactions  . Toprol Xl [Metoprolol Tartrate] Other (See Comments)    Bradycardia    DISCHARGE MEDICATIONS:   Current Discharge Medication List    START taking these medications   Details  LORazepam (ATIVAN) 0.5 MG  tablet Take 1 tablet (0.5 mg total) by mouth every 8 (eight) hours as needed (CIWA-AR > 8-OR-withdrawal symptoms:anxiety, agitation, insomnia, diaphoresis, nausea, vomiting, tremors, tachycardia, or hypertension.). Qty: 15 tablet, Refills: 0    Multiple Vitamin (MULTIVITAMIN WITH MINERALS) TABS tablet Take 1 tablet by mouth daily. Qty: 30 tablet, Refills: 0    predniSONE (DELTASONE) 10 MG tablet Take 50 mg daily taper by 10 mg daily then stop Qty: 15 tablet, Refills: 0    vancomycin (VANCOCIN) 50 mg/mL oral solution Take 5 mLs (250 mg total) by mouth every 6 (six) hours. Qty: 160 mL, Refills: 0      CONTINUE these medications which have CHANGED   Details  valsartan (DIOVAN) 160 MG tablet Take 1 tablet (160 mg total) by mouth daily. Qty: 30 tablet, Refills: 0      CONTINUE these medications which have NOT CHANGED   Details  albuterol (PROVENTIL) (2.5 MG/3ML) 0.083% nebulizer solution INHALE CONTENTS OF 1 VIAL BY MOUTH THROUGH NEBULIZER EVERY 4 TO 6 HOURS AS NEEDED FOR SHORTNESS OF BREATH. Qty: 300 mL, Refills: 12    allopurinol (ZYLOPRIM) 300 MG tablet TAKE 1 TABLET BY MOUTH ONCE DAILY Qty: 30 tablet, Refills: 2    amLODipine (NORVASC) 10 MG tablet TAKE 1 TABLET BY MOUTH ONCE DAILY Qty: 30 tablet, Refills: 2    budesonide-formoterol (SYMBICORT) 160-4.5 MCG/ACT inhaler Inhale 2 puffs into the lungs 2 (two) times daily.    cetirizine (ZYRTEC) 10 MG tablet once daily.    doxycycline (VIBRA-TABS) 100 MG tablet Take 1  tablet (100 mg total) by mouth every 12 (twelve) hours. Qty: 6 tablet, Refills: 0    feeding supplement, ENSURE ENLIVE, (ENSURE ENLIVE) LIQD Take 237 mLs by mouth 2 (two) times daily between meals. Qty: 237 mL, Refills: 12    levothyroxine (SYNTHROID, LEVOTHROID) 100 MCG tablet TAKE 1 TABLET BY MOUTH ONCE DAILY ON AN EMPTY STOMACH. WAIT 30 MINUTES BEFORE TAKING OTHER MEDS. Qty: 30 tablet, Refills: 1    PROVENTIL HFA 108 (90 Base) MCG/ACT inhaler INHALE 2 PUFFS  BY MOUTH EVERY 6 HOURS AS NEEDED FOR WHEEZING OR SHORTNESS OF BREATH. Qty: 6.7 g, Refills: 1      STOP taking these medications     hydrochlorothiazide (HYDRODIURIL) 25 MG tablet      losartan (COZAAR) 100 MG tablet         If you experience worsening of your admission symptoms, develop shortness of breath, life threatening emergency, suicidal or homicidal thoughts you must seek medical attention immediately by calling 911 or calling your MD immediately  if symptoms less severe.  You Must read complete instructions/literature along with all the possible adverse reactions/side effects for all the Medicines you take and that have been prescribed to you. Take any new Medicines after you have completely understood and accept all the possible adverse reactions/side effects.   Please note  You were cared for by a hospitalist during your hospital stay. If you have any questions about your discharge medications or the care you received while you were in the hospital after you are discharged, you can call the unit and asked to speak with the hospitalist on call if the hospitalist that took care of you is not available. Once you are discharged, your primary care physician will handle any further medical issues. Please note that NO REFILLS for any discharge medications will be authorized once you are discharged, as it is imperative that you return to your primary care physician (or establish a relationship with a primary care physician if you do not have one) for your aftercare needs so that they can reassess your need for medications and monitor your lab values. Today   SUBJECTIVE   Diarrhea improving Tolerating diet  VITAL SIGNS:  Blood pressure 137/79, pulse 96, temperature 98.4 F (36.9 C), temperature source Oral, resp. rate 20, height 6\' 1"  (1.854 m), weight 116.8 kg (257 lb 8 oz), SpO2 92 %.  I/O:    Intake/Output Summary (Last 24 hours) at 04/26/17 1054 Last data filed at 04/26/17  0813  Gross per 24 hour  Intake                0 ml  Output             1025 ml  Net            -1025 ml    PHYSICAL EXAMINATION:  GENERAL:  66 y.o.-year-old patient lying in the bed with no acute distress.  EYES: Pupils equal, round, reactive to light and accommodation. No scleral icterus. Extraocular muscles intact.  HEENT: Head atraumatic, normocephalic. Oropharynx and nasopharynx clear.  NECK:  Supple, no jugular venous distention. No thyroid enlargement, no tenderness.  LUNGS: Normal breath sounds bilaterally, no wheezing, rales,rhonchi or crepitation. No use of accessory muscles of respiration.  CARDIOVASCULAR: S1, S2 normal. No murmurs, rubs, or gallops.  ABDOMEN: Soft, non-tender, non-distended. Bowel sounds present. No organomegaly or mass.  EXTREMITIES: No pedal edema, cyanosis, or clubbing.  NEUROLOGIC: Cranial nerves II through XII are intact. Muscle strength 5/5  in all extremities. Sensation intact. Gait not checked.  PSYCHIATRIC: The patient is alert and oriented x 3.  SKIN: No obvious rash, lesion, or ulcer. Bruises from fall in the past  DATA REVIEW:   CBC   Recent Labs Lab 04/26/17 0400  WBC 14.5*  HGB 10.3*  HCT 30.4*  PLT 207    Chemistries   Recent Labs Lab 04/24/17 1737  04/25/17 0643 04/26/17 0400  NA 135  --  132* 137  K 2.7*  --  3.4* 3.7  CL 101  --  107 108  CO2 24  --  20* 22  GLUCOSE 190*  --  195* 176*  BUN 27*  --  24* 20  CREATININE 2.68*  --  1.77* 1.17  CALCIUM 8.3*  --  7.4* 8.0*  MG  --   < > 2.1  --   AST 44*  --   --   --   ALT 50  --   --   --   ALKPHOS 42  --   --   --   BILITOT 1.0  --   --   --   < > = values in this interval not displayed.  Microbiology Results   Recent Results (from the past 240 hour(s))  Blood Culture (routine x 2)     Status: None (Preliminary result)   Collection Time: 04/24/17  6:20 PM  Result Value Ref Range Status   Specimen Description BLOOD BLOOD LEFT FOREARM  Final   Special Requests    Final    BOTTLES DRAWN AEROBIC AND ANAEROBIC Blood Culture results may not be optimal due to an inadequate volume of blood received in culture bottles   Culture NO GROWTH 2 DAYS  Final   Report Status PENDING  Incomplete  Blood Culture (routine x 2)     Status: None (Preliminary result)   Collection Time: 04/24/17  6:20 PM  Result Value Ref Range Status   Specimen Description BLOOD BLOOD LEFT HAND  Final   Special Requests   Final    BOTTLES DRAWN AEROBIC AND ANAEROBIC Blood Culture adequate volume   Culture NO GROWTH 2 DAYS  Final   Report Status PENDING  Incomplete  C difficile quick scan w PCR reflex     Status: Abnormal   Collection Time: 04/24/17 10:10 PM  Result Value Ref Range Status   C Diff antigen POSITIVE (A) NEGATIVE Final   C Diff toxin NEGATIVE NEGATIVE Final   C Diff interpretation Results are indeterminate. See PCR results.  Final  Clostridium Difficile by PCR     Status: Abnormal   Collection Time: 04/24/17 10:10 PM  Result Value Ref Range Status   Toxigenic C Difficile by pcr POSITIVE (A) NEGATIVE Final    Comment: Positive for toxigenic C. difficile with little to no toxin production. Only treat if clinical presentation suggests symptomatic illness.  Gastrointestinal Panel by PCR , Stool     Status: None   Collection Time: 04/24/17 10:17 PM  Result Value Ref Range Status   Campylobacter species NOT DETECTED NOT DETECTED Final   Plesimonas shigelloides NOT DETECTED NOT DETECTED Final   Salmonella species NOT DETECTED NOT DETECTED Final   Yersinia enterocolitica NOT DETECTED NOT DETECTED Final   Vibrio species NOT DETECTED NOT DETECTED Final   Vibrio cholerae NOT DETECTED NOT DETECTED Final   Enteroaggregative E coli (EAEC) NOT DETECTED NOT DETECTED Final   Enteropathogenic E coli (EPEC) NOT DETECTED NOT DETECTED Final   Enterotoxigenic  E coli (ETEC) NOT DETECTED NOT DETECTED Final   Shiga like toxin producing E coli (STEC) NOT DETECTED NOT DETECTED Final    Shigella/Enteroinvasive E coli (EIEC) NOT DETECTED NOT DETECTED Final   Cryptosporidium NOT DETECTED NOT DETECTED Final   Cyclospora cayetanensis NOT DETECTED NOT DETECTED Final   Entamoeba histolytica NOT DETECTED NOT DETECTED Final   Giardia lamblia NOT DETECTED NOT DETECTED Final   Adenovirus F40/41 NOT DETECTED NOT DETECTED Final   Astrovirus NOT DETECTED NOT DETECTED Final   Norovirus GI/GII NOT DETECTED NOT DETECTED Final   Rotavirus A NOT DETECTED NOT DETECTED Final   Sapovirus (I, II, IV, and V) NOT DETECTED NOT DETECTED Final  MRSA PCR Screening     Status: None   Collection Time: 04/25/17  1:13 AM  Result Value Ref Range Status   MRSA by PCR NEGATIVE NEGATIVE Final    Comment:        The GeneXpert MRSA Assay (FDA approved for NASAL specimens only), is one component of a comprehensive MRSA colonization surveillance program. It is not intended to diagnose MRSA infection nor to guide or monitor treatment for MRSA infections.   Urine culture     Status: None   Collection Time: 04/25/17  5:05 AM  Result Value Ref Range Status   Specimen Description URINE, RANDOM  Final   Special Requests NONE  Final   Culture   Final    NO GROWTH Performed at [facility], [address] [place], [place] [address]    Report Status 04/26/2017 FINAL  Final    RADIOLOGY:  Ct Abdomen Pelvis Wo Contrast  Result Date: 04/24/2017 CLINICAL DATA:  Hypertension.  Abdominal cramping. EXAM: CT CHEST, ABDOMEN AND PELVIS WITHOUT CONTRAST TECHNIQUE: Multidetector CT imaging of the chest, abdomen and pelvis was performed following the standard protocol without IV contrast. COMPARISON:  Chest radiograph 04/24/2017 FINDINGS: CT CHEST FINDINGS Cardiovascular: Normal caliber of the great vessels. Minimal calcific atherosclerotic disease of the aorta. Normal heart size. No pericardial effusion. Mediastinum/Nodes: No enlarged mediastinal, hilar, or axillary lymph nodes. Thyroid gland, trachea, and  esophagus demonstrate no significant findings. Lungs/Pleura: Upper lobe predominant emphysema. Peripheral linear peribronchial opacities and mild bronchiectasis, likely represent chronic interstitial lung disease patchy ground-glass opacities also and peripheral predominance are present in both lungs. Right middle lobe volume loss . Musculoskeletal: Multilevel osteoarthritic changes. CT ABDOMEN PELVIS FINDINGS Hepatobiliary: Subcapsular area of hypoattenuation adjacent to the intersegmental fissure in the left lobe of the liver, likely represents focal fatty infiltration. No gallstones, gallbladder wall thickening, or biliary dilatation. Pancreas: Unremarkable. No pancreatic ductal dilatation or surrounding inflammatory changes. Spleen: Normal in size without focal abnormality. Adrenals/Urinary Tract: Adrenal glands are unremarkable. Kidneys are normal, without renal calculi, focal lesion, or hydronephrosis. Bladder is unremarkable. Stomach/Bowel: Stomach is within normal limits. No evidence of bowel wall thickening, distention, or inflammatory changes. Gas fluid levels within the transverse and descending colon, likely due to diarrheal state. Vascular/Lymphatic: Minimal aortic atherosclerosis. No enlarged abdominal or pelvic lymph nodes. Reproductive: Heterogeneous appearance of the prostate gland. Other: No abdominal wall hernia or abnormality. No abdominopelvic ascites. Musculoskeletal: Multilevel osteoarthritic changes of the spine. Bilateral L5 pars articularis defects. Minimal posterior listhesis of L4 on L5. IMPRESSION: Upper lobe predominant emphysema. Chronic interstitial lung disease with bronchiectasis, fibrotic changes and volume loss. Superimposed ground-glass opacities in predominantly peripheral distribution may represent an element of acute infectious/ inflammatory changes on the background of chronic interstitial lung disease. Minimal calcific atherosclerotic disease of the aorta. No  acute  abnormalities within the abdomen. Heterogeneous appearance of the prostate gland. Please correlate to patient's PSA values. Electronically Signed   By: Ted Mcalpine M.D.   On: 04/24/2017 19:55   Dg Chest 1 View  Result Date: 04/24/2017 CLINICAL DATA:  Acute shortness of breath. EXAM: CHEST 1 VIEW COMPARISON:  April 21, 2017 FINDINGS: Focal infiltrate in the left mid lung and perhaps the left base. Increasing haziness over the lateral right lung base with tube removal in the interval. The cardiomediastinal silhouette is stable. No other changes. IMPRESSION: 1. Developing infiltrate in the left mid and lower lung. Recommend follow-up to resolution. 2. Possible developing effusion on the right. Electronically Signed   By: Gerome Sam III M.D   On: 04/24/2017 19:14   Ct Chest Wo Contrast  Result Date: 04/24/2017 CLINICAL DATA:  Hypertension.  Abdominal cramping. EXAM: CT CHEST, ABDOMEN AND PELVIS WITHOUT CONTRAST TECHNIQUE: Multidetector CT imaging of the chest, abdomen and pelvis was performed following the standard protocol without IV contrast. COMPARISON:  Chest radiograph 04/24/2017 FINDINGS: CT CHEST FINDINGS Cardiovascular: Normal caliber of the great vessels. Minimal calcific atherosclerotic disease of the aorta. Normal heart size. No pericardial effusion. Mediastinum/Nodes: No enlarged mediastinal, hilar, or axillary lymph nodes. Thyroid gland, trachea, and esophagus demonstrate no significant findings. Lungs/Pleura: Upper lobe predominant emphysema. Peripheral linear peribronchial opacities and mild bronchiectasis, likely represent chronic interstitial lung disease patchy ground-glass opacities also and peripheral predominance are present in both lungs. Right middle lobe volume loss . Musculoskeletal: Multilevel osteoarthritic changes. CT ABDOMEN PELVIS FINDINGS Hepatobiliary: Subcapsular area of hypoattenuation adjacent to the intersegmental fissure in the left lobe of the liver, likely  represents focal fatty infiltration. No gallstones, gallbladder wall thickening, or biliary dilatation. Pancreas: Unremarkable. No pancreatic ductal dilatation or surrounding inflammatory changes. Spleen: Normal in size without focal abnormality. Adrenals/Urinary Tract: Adrenal glands are unremarkable. Kidneys are normal, without renal calculi, focal lesion, or hydronephrosis. Bladder is unremarkable. Stomach/Bowel: Stomach is within normal limits. No evidence of bowel wall thickening, distention, or inflammatory changes. Gas fluid levels within the transverse and descending colon, likely due to diarrheal state. Vascular/Lymphatic: Minimal aortic atherosclerosis. No enlarged abdominal or pelvic lymph nodes. Reproductive: Heterogeneous appearance of the prostate gland. Other: No abdominal wall hernia or abnormality. No abdominopelvic ascites. Musculoskeletal: Multilevel osteoarthritic changes of the spine. Bilateral L5 pars articularis defects. Minimal posterior listhesis of L4 on L5. IMPRESSION: Upper lobe predominant emphysema. Chronic interstitial lung disease with bronchiectasis, fibrotic changes and volume loss. Superimposed ground-glass opacities in predominantly peripheral distribution may represent an element of acute infectious/ inflammatory changes on the background of chronic interstitial lung disease. Minimal calcific atherosclerotic disease of the aorta. No acute abnormalities within the abdomen. Heterogeneous appearance of the prostate gland. Please correlate to patient's PSA values. Electronically Signed   By: Ted Mcalpine M.D.   On: 04/24/2017 19:55     Management plans discussed with the patient, family and they are in agreement.  CODE STATUS:     Code Status Orders        Start     Ordered   04/25/17 0051  Full code  Continuous     04/25/17 0050    Code Status History    Date Active Date Inactive Code Status Order ID Comments User Context   04/14/2017  7:28 PM 04/22/2017  4:43  PM Full Code [id number]  Ramonita Lab, MD Inpatient    Advance Directive Documentation     Most Recent Value  Type of Advance Directive  Healthcare Power of Attorney, Living will  Pre-existing out of facility DNR order (yellow form or pink MOST form)  -  "MOST" Form in Place?  -      TOTAL TIME TAKING CARE OF THIS PATIENT: 40 minutes.    Happy Begeman M.D on 04/26/2017 at 10:54 AM  Between 7am to 6pm - Pager - [phone] After 6pm go to [email or web address] - Social research officer, government  [facility]  Office  [phone]  CC: Primary care physician; Steele Sizer, MD

## 2017-04-27 LAB — CULTURE, GROUP A STREP (THRC)

## 2017-04-27 NOTE — Telephone Encounter (Signed)
2-4 weeks

## 2017-04-29 ENCOUNTER — Inpatient Hospital Stay: Payer: Medicare Other | Admitting: Family Medicine

## 2017-04-29 ENCOUNTER — Telehealth: Payer: Self-pay | Admitting: Family Medicine

## 2017-04-29 LAB — CULTURE, BLOOD (ROUTINE X 2)
CULTURE: NO GROWTH
Culture: NO GROWTH
Special Requests: ADEQUATE

## 2017-04-29 NOTE — Telephone Encounter (Signed)
Patient scheduled for Hospital follow up today. Did not keep appointment as he is still in rehab. Information reviewed as below. Placed in chart for when he gets out of rehab for review. FYI to PCP.  HOSPITAL FOLLOW UP Time since discharge: 3 days Hospital/facility: [facility] Diagnosis:  ADMISSION DIAGNOSIS:  Severe sepsis with septic shock (HCC) [A41.9, R65.21] AKI (acute kidney injury) (HCC) [N17.9] HCAP (healthcare-associated pneumonia) [J18.9]  DISCHARGE DIAGNOSIS:   Sepsis present on admission-resolved Acute renal failure resolved C difficile colitis COPD  Procedures/tests:   HOSPITAL COURSE:  [name] a 66 y.o.malewho presents with Malaise, cough, fever. Patient was recently discharged from the hospital, and spent a few days in rehabilitation before having to come back tonight with fever  *Sepsis due to C diff On PO vancomycin Diarrhea improving. Received IVF  * Acute COPD exacerbation -IV steroids - Scheduled Nebulizers - Inhalers -Wean O2 as tolerated. sats 90-92% on RA. Prn oxygen at rehab Stopped abx as there is no PNA on CT scan  *Essential hypertension  -resumed amlodipine and valsartan (low dose) and d/ed HCTZ  *Acute kidney injury due to dehydration and ATN---from GI losses Improved with IVF  *Hypothyroidism - home dose thyroid replacement  *h/o chronic alcoholism with anxiety -prn ativan (only 15 tabs rxed) d/w dter who is requesting ativan to help him stay calm. Consultants: None New medications:  START taking these medications   Details  LORazepam (ATIVAN) 0.5 MG tablet Take 1 tablet (0.5 mg total) by mouth every 8 (eight) hours as needed (CIWA-AR > 8-OR-withdrawal symptoms:anxiety, agitation, insomnia, diaphoresis, nausea, vomiting, tremors, tachycardia, or hypertension.). Qty: 15 tablet, Refills: 0    Multiple Vitamin (MULTIVITAMIN WITH MINERALS) TABS tablet Take 1 tablet by mouth daily. Qty: 30 tablet, Refills: 0     predniSONE (DELTASONE) 10 MG tablet Take 50 mg daily taper by 10 mg daily then stop Qty: 15 tablet, Refills: 0    vancomycin (VANCOCIN) 50 mg/mL oral solution Take 5 mLs (250 mg total) by mouth every 6 (six) hours. Qty: 160 mL, Refills: 0          CONTINUE these medications which have CHANGED   Details  valsartan (DIOVAN) 160 MG tablet Take 1 tablet (160 mg total) by mouth daily. Qty: 30 tablet, Refills: 0          CONTINUE these medications which have NOT CHANGED   Details  albuterol (PROVENTIL) (2.5 MG/3ML) 0.083% nebulizer solution INHALE CONTENTS OF 1 VIAL BY MOUTH THROUGH NEBULIZER EVERY 4 TO 6 HOURS AS NEEDED FOR SHORTNESS OF BREATH. Qty: 300 mL, Refills: 12    allopurinol (ZYLOPRIM) 300 MG tablet TAKE 1 TABLET BY MOUTH ONCE DAILY Qty: 30 tablet, Refills: 2    amLODipine (NORVASC) 10 MG tablet TAKE 1 TABLET BY MOUTH ONCE DAILY Qty: 30 tablet, Refills: 2    budesonide-formoterol (SYMBICORT) 160-4.5 MCG/ACT inhaler Inhale 2 puffs into the lungs 2 (two) times daily.    cetirizine (ZYRTEC) 10 MG tablet once daily.    doxycycline (VIBRA-TABS) 100 MG tablet Take 1 tablet (100 mg total) by mouth every 12 (twelve) hours. Qty: 6 tablet, Refills: 0    feeding supplement, ENSURE ENLIVE, (ENSURE ENLIVE) LIQD Take 237 mLs by mouth 2 (two) times daily between meals. Qty: 237 mL, Refills: 12    levothyroxine (SYNTHROID, LEVOTHROID) 100 MCG tablet TAKE 1 TABLET BY MOUTH ONCE DAILY ON AN EMPTY STOMACH. WAIT 30 MINUTES BEFORE TAKING OTHER MEDS. Qty: 30 tablet, Refills: 1    PROVENTIL HFA 108 (90 Base)  MCG/ACT inhaler INHALE 2 PUFFS BY MOUTH EVERY 6 HOURS AS NEEDED FOR WHEEZING OR SHORTNESS OF BREATH. Qty: 6.7 g, Refills: 1         STOP taking these medications     hydrochlorothiazide (HYDRODIURIL) 25 MG tablet      losartan (COZAAR) 100 MG tablet         Discharge instructions:  To go to rehab, but he refused and wanted to go home. Daughter stated  that she couldn't take care of him and was very concerned about bringing him home because of his chronic alcohol dependence. There was a big issue with him going to rehab vs home. Family was not happy about him refusing rehab. He was given a Rx for PRN ativan given agitation. He was quite angry with this situation. Per last note from the hospital, was to go to rehab.

## 2017-04-29 NOTE — Progress Notes (Deleted)
There were no vitals taken for this visit.   Subjective:    Patient ID: Arthur Conner, male    DOB: January 07, 1951, 66 y.o.   MRN: [id number]  HPI: Arthur Conner is a 66 y.o. male  No chief complaint on file.  HOSPITAL FOLLOW UP Time since discharge: 3 days Hospital/facility: [facility] Diagnosis:  ADMISSION DIAGNOSIS:  Severe sepsis with septic shock (HCC) [A41.9, R65.21] AKI (acute kidney injury) (HCC) [N17.9] HCAP (healthcare-associated pneumonia) [J18.9]  DISCHARGE DIAGNOSIS:   Sepsis present on admission-resolved Acute renal failure resolved C difficile colitis COPD  Procedures/tests:   HOSPITAL COURSE:  [name] a 66 y.o.malewho presents with Malaise, cough, fever. Patient was recently discharged from the hospital, and spent a few days in rehabilitation before having to come back tonight with fever  *Sepsis due to C diff On PO vancomycin Diarrhea improving. Received IVF  * Acute COPD exacerbation -IV steroids - Scheduled Nebulizers - Inhalers -Wean O2 as tolerated. sats 90-92% on RA. Prn oxygen at rehab Stopped abx as there is no PNA on CT scan  *Essential hypertension  -resumed amlodipine and valsartan (low dose) and d/ed HCTZ  *Acute kidney injury due to dehydration and ATN---from GI losses Improved with IVF  *Hypothyroidism - home dose thyroid replacement  *h/o chronic alcoholism with anxiety -prn ativan (only 15 tabs rxed) d/w dter who is requesting ativan to help him stay calm. Consultants: None New medications:  START taking these medications   Details  LORazepam (ATIVAN) 0.5 MG tablet Take 1 tablet (0.5 mg total) by mouth every 8 (eight) hours as needed (CIWA-AR > 8-OR-withdrawal symptoms:anxiety, agitation, insomnia, diaphoresis, nausea, vomiting, tremors, tachycardia, or hypertension.). Qty: 15 tablet, Refills: 0    Multiple Vitamin (MULTIVITAMIN WITH MINERALS) TABS tablet Take 1 tablet by mouth daily. Qty:  30 tablet, Refills: 0    predniSONE (DELTASONE) 10 MG tablet Take 50 mg daily taper by 10 mg daily then stop Qty: 15 tablet, Refills: 0    vancomycin (VANCOCIN) 50 mg/mL oral solution Take 5 mLs (250 mg total) by mouth every 6 (six) hours. Qty: 160 mL, Refills: 0          CONTINUE these medications which have CHANGED   Details  valsartan (DIOVAN) 160 MG tablet Take 1 tablet (160 mg total) by mouth daily. Qty: 30 tablet, Refills: 0          CONTINUE these medications which have NOT CHANGED   Details  albuterol (PROVENTIL) (2.5 MG/3ML) 0.083% nebulizer solution INHALE CONTENTS OF 1 VIAL BY MOUTH THROUGH NEBULIZER EVERY 4 TO 6 HOURS AS NEEDED FOR SHORTNESS OF BREATH. Qty: 300 mL, Refills: 12    allopurinol (ZYLOPRIM) 300 MG tablet TAKE 1 TABLET BY MOUTH ONCE DAILY Qty: 30 tablet, Refills: 2    amLODipine (NORVASC) 10 MG tablet TAKE 1 TABLET BY MOUTH ONCE DAILY Qty: 30 tablet, Refills: 2    budesonide-formoterol (SYMBICORT) 160-4.5 MCG/ACT inhaler Inhale 2 puffs into the lungs 2 (two) times daily.    cetirizine (ZYRTEC) 10 MG tablet once daily.    doxycycline (VIBRA-TABS) 100 MG tablet Take 1 tablet (100 mg total) by mouth every 12 (twelve) hours. Qty: 6 tablet, Refills: 0    feeding supplement, ENSURE ENLIVE, (ENSURE ENLIVE) LIQD Take 237 mLs by mouth 2 (two) times daily between meals. Qty: 237 mL, Refills: 12    levothyroxine (SYNTHROID, LEVOTHROID) 100 MCG tablet TAKE 1 TABLET BY MOUTH ONCE DAILY ON AN EMPTY STOMACH. WAIT 30 MINUTES BEFORE TAKING OTHER MEDS. Qty:  30 tablet, Refills: 1    PROVENTIL HFA 108 (90 Base) MCG/ACT inhaler INHALE 2 PUFFS BY MOUTH EVERY 6 HOURS AS NEEDED FOR WHEEZING OR SHORTNESS OF BREATH. Qty: 6.7 g, Refills: 1         STOP taking these medications     hydrochlorothiazide (HYDRODIURIL) 25 MG tablet      losartan (COZAAR) 100 MG tablet         Discharge instructions:  To go to rehab, but he refused and wanted to  go home. Daughter stated that she couldn't take care of him and was very concerned about bringing him home because of his chronic alcohol dependence. There was a big issue with him going to rehab vs home. Family was not happy about him refusing rehab. He was given a Rx for PRN ativan given agitation. He was quite angry with this situation. Per last note from the hospital, was to go to rehab. Status: {Blank multiple:19196::"better","worse","stable","fluctuating"}    Relevant past medical, surgical, family and social history reviewed and updated as indicated. Interim medical history since our last visit reviewed. Allergies and medications reviewed and updated.  Review of Systems  Per HPI unless specifically indicated above     Objective:    There were no vitals taken for this visit.  Wt Readings from Last 3 Encounters:  04/25/17 257 lb 8 oz (116.8 kg)  04/14/17 263 lb 0.1 oz (119.3 kg)  11/06/15 285 lb 6.4 oz (129.5 kg)    Physical Exam  Results for orders placed or performed during the hospital encounter of 04/24/17  Blood Culture (routine x 2)  Result Value Ref Range   Specimen Description BLOOD BLOOD LEFT FOREARM    Special Requests      BOTTLES DRAWN AEROBIC AND ANAEROBIC Blood Culture results may not be optimal due to an inadequate volume of blood received in culture bottles   Culture NO GROWTH 5 DAYS    Report Status 04/29/2017 FINAL   Blood Culture (routine x 2)  Result Value Ref Range   Specimen Description BLOOD BLOOD LEFT HAND    Special Requests      BOTTLES DRAWN AEROBIC AND ANAEROBIC Blood Culture adequate volume   Culture NO GROWTH 5 DAYS    Report Status 04/29/2017 FINAL   Urine culture  Result Value Ref Range   Specimen Description URINE, RANDOM    Special Requests NONE    Culture      NO GROWTH Performed at [facility], [address], [place], [place] [address]    Report Status 04/26/2017 FINAL   C difficile quick scan w PCR reflex  Result Value  Ref Range   C Diff antigen POSITIVE (A) NEGATIVE   C Diff toxin NEGATIVE NEGATIVE   C Diff interpretation Results are indeterminate. See PCR results.   Gastrointestinal Panel by PCR , Stool  Result Value Ref Range   Campylobacter species NOT DETECTED NOT DETECTED   Plesimonas shigelloides NOT DETECTED NOT DETECTED   Salmonella species NOT DETECTED NOT DETECTED   Yersinia enterocolitica NOT DETECTED NOT DETECTED   Vibrio species NOT DETECTED NOT DETECTED   Vibrio cholerae NOT DETECTED NOT DETECTED   Enteroaggregative E coli (EAEC) NOT DETECTED NOT DETECTED   Enteropathogenic E coli (EPEC) NOT DETECTED NOT DETECTED   Enterotoxigenic E coli (ETEC) NOT DETECTED NOT DETECTED   Shiga like toxin producing E coli (STEC) NOT DETECTED NOT DETECTED   Shigella/Enteroinvasive E coli (EIEC) NOT DETECTED NOT DETECTED   Cryptosporidium NOT  DETECTED NOT DETECTED   Cyclospora cayetanensis NOT DETECTED NOT DETECTED   Entamoeba histolytica NOT DETECTED NOT DETECTED   Giardia lamblia NOT DETECTED NOT DETECTED   Adenovirus F40/41 NOT DETECTED NOT DETECTED   Astrovirus NOT DETECTED NOT DETECTED   Norovirus GI/GII NOT DETECTED NOT DETECTED   Rotavirus A NOT DETECTED NOT DETECTED   Sapovirus (I, II, IV, and V) NOT DETECTED NOT DETECTED  Clostridium Difficile by PCR  Result Value Ref Range   Toxigenic C Difficile by pcr POSITIVE (A) NEGATIVE  MRSA PCR Screening  Result Value Ref Range   MRSA by PCR NEGATIVE NEGATIVE  Culture, group A strep  Result Value Ref Range   Specimen Description THROAT    Special Requests NONE    Culture      NO GROUP A STREP (S.PYOGENES) ISOLATED Performed at [facility], [address], [place], [place] [address]    Report Status 04/27/2017 FINAL   Lipase, blood  Result Value Ref Range   Lipase 49 11 - 51 U/L  Comprehensive metabolic panel  Result Value Ref Range   Sodium 135 135 - 145 mmol/L   Potassium 2.7 (LL) 3.5 - 5.1 mmol/L   Chloride 101 101 - 111 mmol/L     CO2 24 22 - 32 mmol/L   Glucose, Bld 190 (H) 65 - 99 mg/dL   BUN 27 (H) 6 - 20 mg/dL   Creatinine, Ser 5.78 (H) 0.61 - 1.24 mg/dL   Calcium 8.3 (L) 8.9 - 10.3 mg/dL   Total Protein 5.9 (L) 6.5 - 8.1 g/dL   Albumin 3.1 (L) 3.5 - 5.0 g/dL   AST 44 (H) 15 - 41 U/L   ALT 50 17 - 63 U/L   Alkaline Phosphatase 42 38 - 126 U/L   Total Bilirubin 1.0 0.3 - 1.2 mg/dL   GFR calc non Af Amer 23 (L) >60 mL/min   GFR calc Af Amer 27 (L) >60 mL/min   Anion gap 10 5 - 15  CBC  Result Value Ref Range   WBC 14.9 (H) 3.8 - 10.6 K/uL   RBC 3.74 (L) 4.40 - 5.90 MIL/uL   Hemoglobin 12.5 (L) 13.0 - 18.0 g/dL   HCT 46.9 (L) 62.9 - 52.8 %   MCV 96.6 80.0 - 100.0 fL   MCH 33.5 26.0 - 34.0 pg   MCHC 34.7 32.0 - 36.0 g/dL   RDW 41.3 (H) 24.4 - 01.0 %   Platelets 260 150 - 440 K/uL  Urinalysis, Complete w Microscopic  Result Value Ref Range   Color, Urine YELLOW (A) YELLOW   APPearance CLEAR (A) CLEAR   Specific Gravity, Urine 1.006 1.005 - 1.030   pH 5.0 5.0 - 8.0   Glucose, UA 150 (A) NEGATIVE mg/dL   Hgb urine dipstick SMALL (A) NEGATIVE   Bilirubin Urine NEGATIVE NEGATIVE   Ketones, ur NEGATIVE NEGATIVE mg/dL   Protein, ur NEGATIVE NEGATIVE mg/dL   Nitrite NEGATIVE NEGATIVE   Leukocytes, UA NEGATIVE NEGATIVE   RBC / HPF 0-5 0 - 5 RBC/hpf   WBC, UA 0-5 0 - 5 WBC/hpf   Bacteria, UA RARE (A) NONE SEEN   Squamous Epithelial / LPF NONE SEEN NONE SEEN   Mucous PRESENT   Troponin I  Result Value Ref Range   Troponin I 0.05 (HH) <0.03 ng/mL  Lactic acid, plasma  Result Value Ref Range   Lactic Acid, Venous 3.8 (HH) 0.5 - 1.9 mmol/L  Lactic acid, plasma  Result Value Ref  Range   Lactic Acid, Venous 1.9 0.5 - 1.9 mmol/L  Brain natriuretic peptide  Result Value Ref Range   B Natriuretic Peptide 27.0 0.0 - 100.0 pg/mL  Magnesium  Result Value Ref Range   Magnesium 1.8 1.7 - 2.4 mg/dL  Ammonia  Result Value Ref Range   Ammonia 17 9 - 35 umol/L  Troponin I  Result Value Ref Range    Troponin I 0.04 (HH) <0.03 ng/mL  Troponin I  Result Value Ref Range   Troponin I 0.04 (HH) <0.03 ng/mL  Basic metabolic panel  Result Value Ref Range   Sodium 132 (L) 135 - 145 mmol/L   Potassium 3.4 (L) 3.5 - 5.1 mmol/L   Chloride 107 101 - 111 mmol/L   CO2 20 (L) 22 - 32 mmol/L   Glucose, Bld 195 (H) 65 - 99 mg/dL   BUN 24 (H) 6 - 20 mg/dL   Creatinine, Ser 5.781.77 (H) 0.61 - 1.24 mg/dL   Calcium 7.4 (L) 8.9 - 10.3 mg/dL   GFR calc non Af Amer 39 (L) >60 mL/min   GFR calc Af Amer 45 (L) >60 mL/min   Anion gap 5 5 - 15  CBC  Result Value Ref Range   WBC 11.6 (H) 3.8 - 10.6 K/uL   RBC 3.25 (L) 4.40 - 5.90 MIL/uL   Hemoglobin 10.7 (L) 13.0 - 18.0 g/dL   HCT 46.931.1 (L) 62.940.0 - 52.852.0 %   MCV 95.8 80.0 - 100.0 fL   MCH 33.1 26.0 - 34.0 pg   MCHC 34.6 32.0 - 36.0 g/dL   RDW 41.316.0 (H) 24.411.5 - 01.014.5 %   Platelets 216 150 - 440 K/uL  Glucose, capillary  Result Value Ref Range   Glucose-Capillary 206 (H) 65 - 99 mg/dL  Magnesium  Result Value Ref Range   Magnesium 2.1 1.7 - 2.4 mg/dL  Phosphorus  Result Value Ref Range   Phosphorus 3.8 2.5 - 4.6 mg/dL  Procalcitonin - Baseline  Result Value Ref Range   Procalcitonin <0.10 ng/mL  Procalcitonin  Result Value Ref Range   Procalcitonin <0.10 ng/mL  Basic metabolic panel  Result Value Ref Range   Sodium 137 135 - 145 mmol/L   Potassium 3.7 3.5 - 5.1 mmol/L   Chloride 108 101 - 111 mmol/L   CO2 22 22 - 32 mmol/L   Glucose, Bld 176 (H) 65 - 99 mg/dL   BUN 20 6 - 20 mg/dL   Creatinine, Ser 2.721.17 0.61 - 1.24 mg/dL   Calcium 8.0 (L) 8.9 - 10.3 mg/dL   GFR calc non Af Amer >60 >60 mL/min   GFR calc Af Amer >60 >60 mL/min   Anion gap 7 5 - 15  CBC  Result Value Ref Range   WBC 14.5 (H) 3.8 - 10.6 K/uL   RBC 3.10 (L) 4.40 - 5.90 MIL/uL   Hemoglobin 10.3 (L) 13.0 - 18.0 g/dL   HCT 53.630.4 (L) 64.440.0 - 03.452.0 %   MCV 97.9 80.0 - 100.0 fL   MCH 33.2 26.0 - 34.0 pg   MCHC 33.9 32.0 - 36.0 g/dL   RDW 74.216.6 (H) 59.511.5 - 63.814.5 %   Platelets 207 150 -  440 K/uL  Type and screen [facility]  Result Value Ref Range   ABO/RH(D) O POS    Antibody Screen NEG    Sample Expiration 04/27/2017       Assessment & Plan:   Problem List Items Addressed This Visit  None       Follow up plan: No Follow-up on file.

## 2017-05-03 ENCOUNTER — Inpatient Hospital Stay: Payer: Medicare Other

## 2017-05-03 ENCOUNTER — Inpatient Hospital Stay
Admission: EM | Admit: 2017-05-03 | Discharge: 2017-05-08 | DRG: 871 | Disposition: E | Payer: Medicare Other | Attending: Internal Medicine | Admitting: Internal Medicine

## 2017-05-03 ENCOUNTER — Encounter: Payer: Self-pay | Admitting: Emergency Medicine

## 2017-05-03 ENCOUNTER — Emergency Department: Payer: Medicare Other

## 2017-05-03 DIAGNOSIS — R0603 Acute respiratory distress: Secondary | ICD-10-CM | POA: Diagnosis not present

## 2017-05-03 DIAGNOSIS — F17210 Nicotine dependence, cigarettes, uncomplicated: Secondary | ICD-10-CM | POA: Diagnosis present

## 2017-05-03 DIAGNOSIS — Z66 Do not resuscitate: Secondary | ICD-10-CM | POA: Diagnosis present

## 2017-05-03 DIAGNOSIS — Z888 Allergy status to other drugs, medicaments and biological substances status: Secondary | ICD-10-CM

## 2017-05-03 DIAGNOSIS — J45909 Unspecified asthma, uncomplicated: Secondary | ICD-10-CM | POA: Diagnosis present

## 2017-05-03 DIAGNOSIS — J90 Pleural effusion, not elsewhere classified: Secondary | ICD-10-CM | POA: Diagnosis present

## 2017-05-03 DIAGNOSIS — F419 Anxiety disorder, unspecified: Secondary | ICD-10-CM | POA: Diagnosis present

## 2017-05-03 DIAGNOSIS — R079 Chest pain, unspecified: Secondary | ICD-10-CM

## 2017-05-03 DIAGNOSIS — J189 Pneumonia, unspecified organism: Secondary | ICD-10-CM | POA: Diagnosis present

## 2017-05-03 DIAGNOSIS — M79662 Pain in left lower leg: Secondary | ICD-10-CM

## 2017-05-03 DIAGNOSIS — Y95 Nosocomial condition: Secondary | ICD-10-CM | POA: Diagnosis present

## 2017-05-03 DIAGNOSIS — I2699 Other pulmonary embolism without acute cor pulmonale: Secondary | ICD-10-CM | POA: Diagnosis present

## 2017-05-03 DIAGNOSIS — E039 Hypothyroidism, unspecified: Secondary | ICD-10-CM | POA: Diagnosis present

## 2017-05-03 DIAGNOSIS — Z515 Encounter for palliative care: Secondary | ICD-10-CM | POA: Diagnosis present

## 2017-05-03 DIAGNOSIS — F329 Major depressive disorder, single episode, unspecified: Secondary | ICD-10-CM | POA: Diagnosis present

## 2017-05-03 DIAGNOSIS — E876 Hypokalemia: Secondary | ICD-10-CM | POA: Diagnosis present

## 2017-05-03 DIAGNOSIS — E872 Acidosis: Secondary | ICD-10-CM | POA: Diagnosis present

## 2017-05-03 DIAGNOSIS — F1010 Alcohol abuse, uncomplicated: Secondary | ICD-10-CM | POA: Diagnosis present

## 2017-05-03 DIAGNOSIS — M109 Gout, unspecified: Secondary | ICD-10-CM | POA: Diagnosis present

## 2017-05-03 DIAGNOSIS — R0602 Shortness of breath: Secondary | ICD-10-CM | POA: Diagnosis present

## 2017-05-03 DIAGNOSIS — R296 Repeated falls: Secondary | ICD-10-CM | POA: Diagnosis present

## 2017-05-03 DIAGNOSIS — J9601 Acute respiratory failure with hypoxia: Secondary | ICD-10-CM

## 2017-05-03 DIAGNOSIS — R600 Localized edema: Secondary | ICD-10-CM | POA: Diagnosis not present

## 2017-05-03 DIAGNOSIS — I10 Essential (primary) hypertension: Secondary | ICD-10-CM | POA: Diagnosis present

## 2017-05-03 DIAGNOSIS — Z79899 Other long term (current) drug therapy: Secondary | ICD-10-CM

## 2017-05-03 DIAGNOSIS — A419 Sepsis, unspecified organism: Secondary | ICD-10-CM | POA: Diagnosis present

## 2017-05-03 DIAGNOSIS — Z7951 Long term (current) use of inhaled steroids: Secondary | ICD-10-CM

## 2017-05-03 DIAGNOSIS — I482 Chronic atrial fibrillation: Secondary | ICD-10-CM | POA: Diagnosis present

## 2017-05-03 DIAGNOSIS — I480 Paroxysmal atrial fibrillation: Secondary | ICD-10-CM | POA: Diagnosis not present

## 2017-05-03 DIAGNOSIS — I4891 Unspecified atrial fibrillation: Secondary | ICD-10-CM

## 2017-05-03 DIAGNOSIS — Z7189 Other specified counseling: Secondary | ICD-10-CM

## 2017-05-03 DIAGNOSIS — J9690 Respiratory failure, unspecified, unspecified whether with hypoxia or hypercapnia: Secondary | ICD-10-CM

## 2017-05-03 LAB — URINALYSIS, COMPLETE (UACMP) WITH MICROSCOPIC
Bacteria, UA: NONE SEEN
Bilirubin Urine: NEGATIVE
Glucose, UA: NEGATIVE mg/dL
HGB URINE DIPSTICK: NEGATIVE
Ketones, ur: NEGATIVE mg/dL
Leukocytes, UA: NEGATIVE
Nitrite: NEGATIVE
PROTEIN: NEGATIVE mg/dL
RBC / HPF: NONE SEEN RBC/hpf (ref 0–5)
SPECIFIC GRAVITY, URINE: 1.005 (ref 1.005–1.030)
SQUAMOUS EPITHELIAL / LPF: NONE SEEN
pH: 6 (ref 5.0–8.0)

## 2017-05-03 LAB — EXPECTORATED SPUTUM ASSESSMENT W GRAM STAIN, RFLX TO RESP C

## 2017-05-03 LAB — CBC WITH DIFFERENTIAL/PLATELET
BASOS ABS: 0 10*3/uL (ref 0–0.1)
BASOS PCT: 0 %
Eosinophils Absolute: 0.1 10*3/uL (ref 0–0.7)
Eosinophils Relative: 1 %
HEMATOCRIT: 35.2 % — AB (ref 40.0–52.0)
Hemoglobin: 12.2 g/dL — ABNORMAL LOW (ref 13.0–18.0)
Lymphocytes Relative: 10 %
Lymphs Abs: 1.3 10*3/uL (ref 1.0–3.6)
MCH: 33.9 pg (ref 26.0–34.0)
MCHC: 34.8 g/dL (ref 32.0–36.0)
MCV: 97.5 fL (ref 80.0–100.0)
Monocytes Absolute: 0.8 10*3/uL (ref 0.2–1.0)
Monocytes Relative: 6 %
NEUTROS ABS: 10.3 10*3/uL — AB (ref 1.4–6.5)
NEUTROS PCT: 83 %
Platelets: 171 10*3/uL (ref 150–440)
RBC: 3.61 MIL/uL — AB (ref 4.40–5.90)
RDW: 16.2 % — ABNORMAL HIGH (ref 11.5–14.5)
WBC: 12.5 10*3/uL — AB (ref 3.8–10.6)

## 2017-05-03 LAB — COMPREHENSIVE METABOLIC PANEL
ALBUMIN: 3 g/dL — AB (ref 3.5–5.0)
ALT: 32 U/L (ref 17–63)
AST: 27 U/L (ref 15–41)
Alkaline Phosphatase: 41 U/L (ref 38–126)
Anion gap: 9 (ref 5–15)
BUN: 8 mg/dL (ref 6–20)
CHLORIDE: 110 mmol/L (ref 101–111)
CO2: 19 mmol/L — ABNORMAL LOW (ref 22–32)
CREATININE: 0.98 mg/dL (ref 0.61–1.24)
Calcium: 7.8 mg/dL — ABNORMAL LOW (ref 8.9–10.3)
GFR calc Af Amer: >60 mL/min (ref >60)
GLUCOSE: 90 mg/dL (ref 65–99)
Potassium: 2.9 mmol/L — ABNORMAL LOW (ref 3.5–5.1)
Sodium: 138 mmol/L (ref 135–145)
Total Bilirubin: 1.2 mg/dL (ref 0.3–1.2)
Total Protein: 5.5 g/dL — ABNORMAL LOW (ref 6.5–8.1)

## 2017-05-03 LAB — BLOOD GAS, VENOUS
ACID-BASE DEFICIT: 3.9 mmol/L — AB (ref 0.0–2.0)
Bicarbonate: 17.9 mmol/L — ABNORMAL LOW (ref 20.0–28.0)
FIO2: 0.28
O2 SAT: 93.1 %
PATIENT TEMPERATURE: 37
PO2 VEN: 62 mmHg — AB (ref 32.0–45.0)
pCO2, Ven: 24 mmHg — ABNORMAL LOW (ref 44.0–60.0)
pH, Ven: 7.48 — ABNORMAL HIGH (ref 7.250–7.430)

## 2017-05-03 LAB — CBC
HEMATOCRIT: 36.8 % — AB (ref 40.0–52.0)
HEMOGLOBIN: 12.9 g/dL — AB (ref 13.0–18.0)
MCH: 33.2 pg (ref 26.0–34.0)
MCHC: 35.1 g/dL (ref 32.0–36.0)
MCV: 94.6 fL (ref 80.0–100.0)
Platelets: 198 10*3/uL (ref 150–440)
RBC: 3.88 MIL/uL — ABNORMAL LOW (ref 4.40–5.90)
RDW: 16.1 % — ABNORMAL HIGH (ref 11.5–14.5)
WBC: 12.1 10*3/uL — AB (ref 3.8–10.6)

## 2017-05-03 LAB — GLUCOSE, CAPILLARY: Glucose-Capillary: 114 mg/dL — ABNORMAL HIGH (ref 65–99)

## 2017-05-03 LAB — HEPATIC FUNCTION PANEL
ALBUMIN: 3.5 g/dL (ref 3.5–5.0)
ALT: 38 U/L (ref 17–63)
AST: 29 U/L (ref 15–41)
Alkaline Phosphatase: 51 U/L (ref 38–126)
BILIRUBIN TOTAL: 1.3 mg/dL — AB (ref 0.3–1.2)
Bilirubin, Direct: 0.2 mg/dL (ref 0.1–0.5)
Indirect Bilirubin: 1.1 mg/dL — ABNORMAL HIGH (ref 0.3–0.9)
TOTAL PROTEIN: 6.3 g/dL — AB (ref 6.5–8.1)

## 2017-05-03 LAB — BASIC METABOLIC PANEL
ANION GAP: 11 (ref 5–15)
BUN: 9 mg/dL (ref 6–20)
CALCIUM: 8.4 mg/dL — AB (ref 8.9–10.3)
CO2: 18 mmol/L — ABNORMAL LOW (ref 22–32)
Chloride: 106 mmol/L (ref 101–111)
Creatinine, Ser: 1.1 mg/dL (ref 0.61–1.24)
Glucose, Bld: 160 mg/dL — ABNORMAL HIGH (ref 65–99)
POTASSIUM: 3.1 mmol/L — AB (ref 3.5–5.1)
SODIUM: 135 mmol/L (ref 135–145)

## 2017-05-03 LAB — LACTIC ACID, PLASMA
LACTIC ACID, VENOUS: 2.8 mmol/L — AB (ref 0.5–1.9)
LACTIC ACID, VENOUS: 3.6 mmol/L — AB (ref 0.5–1.9)
LACTIC ACID, VENOUS: 2.9 mmol/L — AB (ref 0.5–1.9)

## 2017-05-03 LAB — PROTIME-INR
INR: 1.15
INR: 1.4
PROTHROMBIN TIME: 14.8 s (ref 11.4–15.2)
Prothrombin Time: 17.3 seconds — ABNORMAL HIGH (ref 11.4–15.2)

## 2017-05-03 LAB — PROCALCITONIN: Procalcitonin: <0.1 ng/mL

## 2017-05-03 LAB — BRAIN NATRIURETIC PEPTIDE: B NATRIURETIC PEPTIDE 5: 434 pg/mL — AB (ref 0.0–100.0)

## 2017-05-03 LAB — EXPECTORATED SPUTUM ASSESSMENT W REFEX TO RESP CULTURE

## 2017-05-03 LAB — HEPARIN LEVEL (UNFRACTIONATED): Heparin Unfractionated: 0.47 IU/mL (ref 0.30–0.70)

## 2017-05-03 LAB — TROPONIN I

## 2017-05-03 LAB — APTT: aPTT: 148 seconds — ABNORMAL HIGH (ref 24–36)

## 2017-05-03 LAB — TSH: TSH: 0.554 u[IU]/mL (ref 0.350–4.500)

## 2017-05-03 LAB — MRSA PCR SCREENING: MRSA by PCR: NEGATIVE

## 2017-05-03 MED ORDER — LORAZEPAM 2 MG/ML IJ SOLN
1.0000 mg | Freq: Once | INTRAMUSCULAR | Status: AC
  Administered 2017-05-03: 1 mg via INTRAVENOUS

## 2017-05-03 MED ORDER — ALLOPURINOL 300 MG PO TABS
300.0000 mg | ORAL_TABLET | Freq: Every day | ORAL | Status: DC
  Filled 2017-05-03: qty 1

## 2017-05-03 MED ORDER — ENSURE ENLIVE PO LIQD: 237.0000 mL | Freq: Two times a day (BID) | ORAL | Status: DC

## 2017-05-03 MED ORDER — IPRATROPIUM-ALBUTEROL 0.5-2.5 (3) MG/3ML IN SOLN
3.0000 mL | Freq: Once | RESPIRATORY_TRACT | Status: AC
  Administered 2017-05-03: 3 mL via RESPIRATORY_TRACT

## 2017-05-03 MED ORDER — DEXTROSE 5 % IV SOLN: 2.0000 g | Freq: Once | INTRAVENOUS | Status: DC

## 2017-05-03 MED ORDER — DEXTROSE 5 % IV SOLN
2.0000 g | Freq: Three times a day (TID) | INTRAVENOUS | Status: DC
  Administered 2017-05-03: 2 g via INTRAVENOUS
  Filled 2017-05-03 (×4): qty 2

## 2017-05-03 MED ORDER — LORAZEPAM 2 MG/ML IJ SOLN
2.0000 mg | INTRAMUSCULAR | Status: DC | PRN
  Administered 2017-05-03 – 2017-05-04 (×3): 2 mg via INTRAVENOUS
  Filled 2017-05-03 (×3): qty 1

## 2017-05-03 MED ORDER — LEVOTHYROXINE SODIUM 100 MCG PO TABS
100.0000 ug | ORAL_TABLET | Freq: Every day | ORAL | Status: DC
  Administered 2017-05-04: 100 ug via ORAL
  Filled 2017-05-03: qty 1

## 2017-05-03 MED ORDER — LORAZEPAM 2 MG/ML IJ SOLN
1.0000 mg | Freq: Once | INTRAMUSCULAR | Status: AC
  Administered 2017-05-03: 1 mg via INTRAVENOUS
  Filled 2017-05-03: qty 1

## 2017-05-03 MED ORDER — DILTIAZEM HCL 100 MG IV SOLR
5.0000 mg/h | Freq: Once | INTRAVENOUS | Status: AC
  Administered 2017-05-03: 5 mg/h via INTRAVENOUS
  Filled 2017-05-03: qty 100

## 2017-05-03 MED ORDER — AMIODARONE HCL 200 MG PO TABS
400.0000 mg | ORAL_TABLET | Freq: Every day | ORAL | Status: DC
Start: 2017-05-11 — End: 2017-05-04

## 2017-05-03 MED ORDER — IPRATROPIUM-ALBUTEROL 0.5-2.5 (3) MG/3ML IN SOLN
3.0000 mL | RESPIRATORY_TRACT | Status: DC | PRN
  Administered 2017-05-03 – 2017-05-04 (×2): 3 mL via RESPIRATORY_TRACT
  Filled 2017-05-03 (×2): qty 3

## 2017-05-03 MED ORDER — LORAZEPAM 0.5 MG PO TABS: 0.5000 mg | ORAL_TABLET | Freq: Three times a day (TID) | ORAL | Status: DC | PRN

## 2017-05-03 MED ORDER — DILTIAZEM HCL 100 MG IV SOLR
0.0000 mg/h | Freq: Once | INTRAVENOUS | Status: AC
  Administered 2017-05-03: 10 mg/h via INTRAVENOUS
  Filled 2017-05-03: qty 100

## 2017-05-03 MED ORDER — HEPARIN BOLUS VIA INFUSION
6300.0000 [IU] | Freq: Once | INTRAVENOUS | Status: AC
  Administered 2017-05-03: 6300 [IU] via INTRAVENOUS
  Filled 2017-05-03: qty 6300

## 2017-05-03 MED ORDER — AMIODARONE IV BOLUS ONLY 150 MG/100ML
150.0000 mg | Freq: Once | INTRAVENOUS | Status: AC
Start: 2017-05-03 — End: 2017-05-03
  Administered 2017-05-03: 150 mg via INTRAVENOUS
  Filled 2017-05-03: qty 100

## 2017-05-03 MED ORDER — DEXTROSE 5 % IV SOLN
2.0000 g | Freq: Three times a day (TID) | INTRAVENOUS | Status: DC
  Administered 2017-05-04: 2 g via INTRAVENOUS
  Filled 2017-05-03 (×3): qty 2

## 2017-05-03 MED ORDER — ADULT MULTIVITAMIN W/MINERALS CH
1.0000 | ORAL_TABLET | Freq: Every day | ORAL | Status: DC
  Administered 2017-05-03: 1 via ORAL
  Filled 2017-05-03 (×2): qty 1

## 2017-05-03 MED ORDER — DILTIAZEM HCL 100 MG IV SOLR
5.0000 mg/h | INTRAVENOUS | Status: DC
  Administered 2017-05-03: 10 mg/h via INTRAVENOUS
  Administered 2017-05-04 (×2): 15 mg/h via INTRAVENOUS
  Filled 2017-05-03 (×2): qty 100

## 2017-05-03 MED ORDER — AMIODARONE HCL 200 MG PO TABS
400.0000 mg | ORAL_TABLET | Freq: Once | ORAL | Status: AC
  Administered 2017-05-03: 400 mg via ORAL
  Filled 2017-05-03: qty 2

## 2017-05-03 MED ORDER — VANCOMYCIN HCL IN DEXTROSE 1-5 GM/200ML-% IV SOLN: 1000.0000 mg | Freq: Once | INTRAVENOUS | Status: DC

## 2017-05-03 MED ORDER — LEVOFLOXACIN IN D5W 750 MG/150ML IV SOLN
750.0000 mg | Freq: Once | INTRAVENOUS | Status: AC
  Administered 2017-05-03: 750 mg via INTRAVENOUS
  Filled 2017-05-03: qty 150

## 2017-05-03 MED ORDER — IOPAMIDOL (ISOVUE-370) INJECTION 76%
75.0000 mL | Freq: Once | INTRAVENOUS | Status: AC | PRN
  Administered 2017-05-03: 75 mL via INTRAVENOUS

## 2017-05-03 MED ORDER — DILTIAZEM HCL 25 MG/5ML IV SOLN: 10.0000 mg | Freq: Once | INTRAVENOUS | Status: DC

## 2017-05-03 MED ORDER — HYDROCOD POLST-CPM POLST ER 10-8 MG/5ML PO SUER
5.0000 mL | Freq: Two times a day (BID) | ORAL | Status: DC | PRN
  Administered 2017-05-03: 5 mL via ORAL
  Filled 2017-05-03: qty 5

## 2017-05-03 MED ORDER — DILTIAZEM LOAD VIA INFUSION
10.0000 mg | Freq: Once | INTRAVENOUS | Status: AC
  Administered 2017-05-03: 10 mg via INTRAVENOUS
  Filled 2017-05-03: qty 10

## 2017-05-03 MED ORDER — ALBUTEROL SULFATE (2.5 MG/3ML) 0.083% IN NEBU: 2.5000 mg | INHALATION_SOLUTION | RESPIRATORY_TRACT | Status: DC | PRN

## 2017-05-03 MED ORDER — PIPERACILLIN-TAZOBACTAM 3.375 G IVPB 30 MIN
3.3750 g | Freq: Once | INTRAVENOUS | Status: AC
  Administered 2017-05-03: 3.375 g via INTRAVENOUS
  Filled 2017-05-03: qty 50

## 2017-05-03 MED ORDER — IPRATROPIUM-ALBUTEROL 0.5-2.5 (3) MG/3ML IN SOLN
RESPIRATORY_TRACT | Status: AC
  Administered 2017-05-03: 3 mL via RESPIRATORY_TRACT
  Filled 2017-05-03: qty 3

## 2017-05-03 MED ORDER — VANCOMYCIN HCL 10 G IV SOLR
1500.0000 mg | Freq: Two times a day (BID) | INTRAVENOUS | Status: DC
  Administered 2017-05-03: 1500 mg via INTRAVENOUS
  Filled 2017-05-03 (×2): qty 1500

## 2017-05-03 MED ORDER — DILTIAZEM LOAD VIA INFUSION
10.0000 mg | Freq: Once | INTRAVENOUS | Status: AC
  Administered 2017-05-03: 10 mg via INTRAVENOUS

## 2017-05-03 MED ORDER — HEPARIN (PORCINE) IN NACL 100-0.45 UNIT/ML-% IJ SOLN
1900.0000 [IU]/h | INTRAMUSCULAR | Status: DC
  Administered 2017-05-03 – 2017-05-04 (×2): 1700 [IU]/h via INTRAVENOUS
  Filled 2017-05-03 (×3): qty 250

## 2017-05-03 MED ORDER — SODIUM CHLORIDE 0.9 % IV SOLN
Freq: Once | INTRAVENOUS | Status: AC
  Administered 2017-05-03: 12:00:00 via INTRAVENOUS

## 2017-05-03 MED ORDER — VANCOMYCIN 50 MG/ML ORAL SOLUTION
250.0000 mg | Freq: Four times a day (QID) | ORAL | Status: DC
  Administered 2017-05-03 – 2017-05-04 (×4): 250 mg via ORAL
  Filled 2017-05-03 (×5): qty 5

## 2017-05-03 MED ORDER — VANCOMYCIN HCL IN DEXTROSE 1-5 GM/200ML-% IV SOLN
1000.0000 mg | Freq: Once | INTRAVENOUS | Status: AC
  Administered 2017-05-03: 1000 mg via INTRAVENOUS
  Filled 2017-05-03: qty 200

## 2017-05-03 MED ORDER — PIPERACILLIN-TAZOBACTAM 3.375 G IVPB
INTRAVENOUS | Status: AC
  Filled 2017-05-03: qty 50

## 2017-05-03 MED ORDER — POTASSIUM CHLORIDE 10 MEQ/100ML IV SOLN
10.0000 meq | INTRAVENOUS | Status: AC
  Administered 2017-05-03 – 2017-05-04 (×4): 10 meq via INTRAVENOUS
  Filled 2017-05-03 (×4): qty 100

## 2017-05-03 MED ORDER — SODIUM CHLORIDE 0.9 % IV BOLUS (SEPSIS): 1000.0000 mL | Freq: Once | INTRAVENOUS | Status: DC

## 2017-05-03 MED ORDER — MOMETASONE FURO-FORMOTEROL FUM 200-5 MCG/ACT IN AERO: 2.0000 | INHALATION_SPRAY | Freq: Two times a day (BID) | RESPIRATORY_TRACT | Status: DC

## 2017-05-03 MED ORDER — AMIODARONE IV BOLUS ONLY 150 MG/100ML
150.0000 mg | Freq: Once | INTRAVENOUS | Status: AC
  Administered 2017-05-03: 150 mg via INTRAVENOUS
  Filled 2017-05-03 (×2): qty 100

## 2017-05-03 MED ORDER — AMLODIPINE BESYLATE 10 MG PO TABS
10.0000 mg | ORAL_TABLET | Freq: Every day | ORAL | Status: DC
  Filled 2017-05-03: qty 1

## 2017-05-03 MED ORDER — LORAZEPAM 1 MG PO TABS
1.0000 mg | ORAL_TABLET | ORAL | Status: DC | PRN
  Administered 2017-05-03: 1 mg via ORAL
  Filled 2017-05-03: qty 1

## 2017-05-03 MED ORDER — POTASSIUM CHLORIDE CRYS ER 20 MEQ PO TBCR: 40.0000 meq | EXTENDED_RELEASE_TABLET | Freq: Once | ORAL | Status: DC

## 2017-05-03 MED ORDER — LORATADINE 10 MG PO TABS: 10.0000 mg | ORAL_TABLET | Freq: Every day | ORAL | Status: DC

## 2017-05-03 MED ORDER — AMIODARONE HCL 200 MG PO TABS
400.0000 mg | ORAL_TABLET | Freq: Two times a day (BID) | ORAL | Status: DC
  Administered 2017-05-04: 400 mg via ORAL
  Filled 2017-05-03: qty 2

## 2017-05-03 NOTE — ED Triage Notes (Signed)
Pt here for SHOB/CP/generalized pain after fall 2 weeks ago.  Noted tachypnea in 30s in triage. Sent for r/o pneumothorax.  Pt has multiple bruises all over.  On abx per pt for blood infection

## 2017-05-03 NOTE — ED Notes (Signed)
Pt BP 99/64, Dr. Mayford KnifeWilliams made aware, Amio bolus given per Dr. Mayford KnifeWIlliams

## 2017-05-03 NOTE — Progress Notes (Signed)
Patient's family calling for updates on patient. Patient refusing all visitors this shift and refusing this RN to update patient's family via phone. Patient states that his family "doesn't give a sh*t about me" and that "no one understand the pain and suffering that I am experiencing". Patient refusing to allow any nursing staff to view, verify or follow proper protocol regarding his medication. Patient states he has been taking Symbicort and Albuterol for years and know how to take them and will not allow staff to handle them. Per patient, he had an issue with his previous admission and had to go two weeks without his inhalers and nearly died without them.   CT scan completed- patient taken to and from scan with nurse and tech.  Lab called sputum culture rejected, will attempt to re-collect new specimen.

## 2017-05-03 NOTE — Progress Notes (Signed)
eLink Physician-Brief Progress Note Patient Name: Arthur AversJoseph L Conner DOB: 1951/01/07 MRN: [id number]   Date of Service  02/26/2017  HPI/Events of Note  66 yo man, MMP including recent C diff colitis, admitted with lactic acidosis and suspected sepsis, A fib + RVR. He remains in A fib, HR 140's, diltiazem being titrated. BP 91/73. Will continue to follow, ? Whether he will need alternative rate control. Current abx cefepime + vanco.   eICU Interventions       Intervention Category Evaluation Type: New Patient Evaluation  Arthur Thorstenson S. 02/26/2017, 4:39 PM

## 2017-05-03 NOTE — Progress Notes (Signed)
Pharmacy Antibiotic Note  Arthur AversJoseph L Conner is a 66 y.o. male admitted on 04/15/2017 with pneumonia.  Pharmacy has been consulted for vancomycin and cefepime dosing.  Plan: Cefepime 2 g IV q8h  Vancomycin 1000 mg given in ED.  Will order vancomycin 1500 mg IV q12h (6 hour stack).  Goal VT 15-20 mcg/mL VT prior to 5th dose once at steady state  Kinetics: Adjusted body weight = 94 kg, CrCl = 89 mL/min Ke: 0.078 Half-life: 9 hrs Vd: 66 L Cmin (estimated) 17 mcg/mL  Of note, patient is at risk of accumulation due to obesity.  Height: 6\' 1"  (185.4 cm) Weight: 255 lb 15.3 oz (116.1 kg) IBW/kg (Calculated) : 79.9  Temp (24hrs), Avg:97.7 F (36.5 C), Min:97.5 F (36.4 C), Max:97.9 F (36.6 C)   Recent Labs Lab 04/22/2017 1136 04/24/2017 1145 05/01/2017 1445  WBC 12.1*  --   --   CREATININE 1.10  --   --   LATICACIDVEN  --  2.8* 3.6*    Estimated Creatinine Clearance: 89.4 mL/min (by C-G formula based on SCr of 1.1 mg/dL).    Allergies  Allergen Reactions  . Toprol Xl [Metoprolol Tartrate] Other (See Comments)    Bradycardia    Antimicrobials this admission: vancomycin 6/26 >>  cefepime 6/26 >>   Dose adjustments this admission:  Microbiology results: 6/26 BCx: Sent 6/26 UCx: Sent   Thank you for allowing pharmacy to be a part of this patient's care.  Cindi CarbonMary M Gracious Renken, PharmD Clinical Pharmacist 05/07/2017 5:07 PM

## 2017-05-03 NOTE — ED Notes (Signed)
Dr. Mayford KnifeWilliams notified of critical lactic acid

## 2017-05-03 NOTE — ED Provider Notes (Signed)
[facility] Emergency Department Provider Note       Time seen: ----------------------------------------- 11:47 AM on 2017-04-15 -----------------------------------------     I have reviewed the triage vital signs and the nursing notes.   HISTORY   Chief Complaint Fall    HPI Arthur AversJoseph L Conner is a 66 y.o. male who presents to the ED for shortness of breath and generalized pain after a fall 2 weeks ago. Patient was also noted to be tachypnea can tachycardic. [facility] and was sent here for further evaluation. Recently had pneumothorax and then subsequent pneumonia with sepsis and ICU admission. He is on antibiotics according to him for infection that he had recently. His only complaint currently is shortness of breath and weakness.   Past Medical History:  Diagnosis Date  . Anxiety   . Asthma   . Depression   . Gout   . Hypertension     Patient Active Problem List   Diagnosis Date Noted  . Sepsis (HCC) 04/24/2017  . HCAP (healthcare-associated pneumonia) 04/24/2017  . Alcohol use 04/24/2017  . Delirium tremens (HCC) 04/19/2017  . Subacute delirium 04/19/2017  . COPD (chronic obstructive pulmonary disease) (HCC) 04/18/2017  . Hyponatremia 04/14/2017  . Asthmatic bronchitis 10/31/2015  . Hypothyroidism 08/12/2015  . Asthma exacerbation 07/29/2015  . Essential hypertension 04/24/2015    Past Surgical History:  Procedure Laterality Date  . APPENDECTOMY    . TONSILLECTOMY      Allergies Toprol xl [metoprolol tartrate]  Social History Social History  Substance Use Topics  . Smoking status: Current Every Day Smoker    Packs/day: 0.50    Types: Cigarettes    Last attempt to quit: 08/11/1990  . Smokeless tobacco: Never Used  . Alcohol use 0.0 oz/week     Comment: "I'm not even sure how much I drink"    Review of Systems Constitutional: Negative for fever. Eyes: Negative for vision changes ENT:  Negative for  congestion, sore throat Cardiovascular: Negative for chest pain. Respiratory: Positive for shortness of breath Gastrointestinal: Negative for abdominal pain, vomiting and diarrhea. Genitourinary: Negative for dysuria. Musculoskeletal: Negative for back pain. Skin: Negative for rash. Neurological: Negative for headaches, positive for generalized weakness  All systems negative/normal/unremarkable except as stated in the HPI  ____________________________________________   PHYSICAL EXAM:  VITAL SIGNS: ED Triage Vitals  Enc Vitals Group     BP June 07, 2017 1132 102/72     Pulse Rate June 07, 2017 1132 80     Resp June 07, 2017 1132 (!) 36     Temp June 07, 2017 1132 97.5 F (36.4 C)     Temp Source June 07, 2017 1132 Oral     SpO2 June 07, 2017 1132 96 %     Weight --      Height --      Head Circumference --      Peak Flow --      Pain Score June 07, 2017 1133 10     Pain Loc --      Pain Edu? --      Excl. in GC? --     Constitutional: Alert and oriented. Moderate distress Eyes: Conjunctivae are normal. Normal extraocular movements. ENT   Head: Normocephalic and atraumatic.   Nose: No congestion/rhinnorhea.   Mouth/Throat: Mucous membranes are moist.   Neck: No stridor. Cardiovascular: Rapid rate, irregular rhythm No murmurs, rubs, or gallops. Respiratory: Tachypnea with grossly clear breath sounds Gastrointestinal: Soft and nontender. Normal bowel sounds Musculoskeletal: Nontender with normal range of motion in extremities. No  lower extremity tenderness nor edema. Neurologic:  Normal speech and language. No gross focal neurologic deficits are appreciated.  Skin:  Skin is warm, dry and intact. Pallor is noted Psychiatric: Mood and affect are normal. Speech and behavior are normal.  ____________________________________________  EKG: Interpreted by me. Indeterminate rhythm with a rate of 170 bpm, likely atrial fibrillation with a rapid ventricular response, normal QRS, long QT, baseline  artifact  ____________________________________________  ED COURSE:  Pertinent labs & imaging results that were available during my care of the patient were reviewed by me and considered in my medical decision making (see chart for details). Patient presents for dyspnea and tachycardia, we will assess with labs and imaging as indicated. Patient appears likely critically ill, we will initiate sepsis protocols.   Procedures ____________________________________________   LABS (pertinent positives/negatives)  Labs Reviewed  BASIC METABOLIC PANEL - Abnormal; Notable for the following:       Result Value   Potassium 3.1 (*)    CO2 18 (*)    Glucose, Bld 160 (*)    Calcium 8.4 (*)    All other components within normal limits  CBC - Abnormal; Notable for the following:    WBC 12.1 (*)    RBC 3.88 (*)    Hemoglobin 12.9 (*)    HCT 36.8 (*)    RDW 16.1 (*)    All other components within normal limits  BLOOD GAS, VENOUS - Abnormal; Notable for the following:    pH, Ven 7.48 (*)    pCO2, Ven 24 (*)    pO2, Ven 62.0 (*)    Bicarbonate 17.9 (*)    Acid-base deficit 3.9 (*)    All other components within normal limits  HEPATIC FUNCTION PANEL - Abnormal; Notable for the following:    Total Protein 6.3 (*)    Total Bilirubin 1.3 (*)    Indirect Bilirubin 1.1 (*)    All other components within normal limits  CULTURE, BLOOD (ROUTINE X 2)  CULTURE, BLOOD (ROUTINE X 2)  URINE CULTURE  TROPONIN I  PROTIME-INR  LACTIC ACID, PLASMA  LACTIC ACID, PLASMA  URINALYSIS, COMPLETE (UACMP) WITH MICROSCOPIC  BRAIN NATRIURETIC PEPTIDE   CRITICAL CARE Performed by: Emily Filbert   Total critical care time: 30 minutes  Critical care time was exclusive of separately billable procedures and treating other patients.  Critical care was necessary to treat or prevent imminent or life-threatening deterioration.  Critical care was time spent personally by me on the following activities:  development of treatment plan with patient and/or surrogate as well as nursing, discussions with consultants, evaluation of patient's response to treatment, examination of patient, obtaining history from patient or surrogate, ordering and performing treatments and interventions, ordering and review of laboratory studies, ordering and review of radiographic studies, pulse oximetry and re-evaluation of patient's condition.  RADIOLOGY Images were viewed by me  Chest x-ray  IMPRESSION: Progressive bilateral pulmonary infiltrates, left greater than right. Emphysema. ____________________________________________  FINAL ASSESSMENT AND PLAN  Pneumonia, atrial fibrillation with a rapid ventricular response  Plan: Patient's labs and imaging were dictated above. Patient had presented for respiratory distress and is improved distal nasal cannula oxygen. His also found to have atrial fibrillation with rapid ventricular response. Blood pressure is improved with saline infusion. We have ordered broad-spectrum antibiotics he will need readmission to the hospital. Currently is on a diltiazem drip and slowly improving.   Emily Filbert, MD   Note: This note was generated in part or whole with  voice recognition software. Voice recognition is usually quite accurate but there are transcription errors that can and very often do occur. I apologize for any typographical errors that were not detected and corrected.     Emily Filbert, MD May 12, 2017 1247

## 2017-05-03 NOTE — Progress Notes (Signed)
ANTICOAGULATION CONSULT NOTE - Initial Consult  Pharmacy Consult for heparin Indication: pulmonary embolus  Allergies  Allergen Reactions  . Toprol Xl [Metoprolol Tartrate] Other (See Comments)    Bradycardia    Patient Measurements: Height: 6\' 1"  (185.4 cm) Weight: 255 lb 15.3 oz (116.1 kg) IBW/kg (Calculated) : 79.9 Heparin Dosing Weight: 105 kg  Vital Signs: Temp: 98.7 F (37.1 C) (06/26 2200) Temp Source: Oral (06/26 2200) BP: 99/73 (06/26 2200) Pulse Rate: 140 (06/26 2200)  Labs:  Recent Labs  05/15/2017 1136 05-15-2017 1853 2017-05-15 2227  HGB 12.9* 12.2*  --   HCT 36.8* 35.2*  --   PLT 198 171  --   APTT  --  148*  --   LABPROT 14.8 17.3*  --   INR 1.15 1.40  --   HEPARINUNFRC  --   --  0.47  CREATININE 1.10 0.98  --   TROPONINI <0.03  --   --     Estimated Creatinine Clearance: 100.3 mL/min (by C-G formula based on SCr of 0.98 mg/dL).   Medical History: Past Medical History:  Diagnosis Date  . Anxiety   . Asthma   . Depression   . Gout   . Hypertension     Medications:  Prescriptions Prior to Admission  Medication Sig Dispense Refill Last Dose  . albuterol (PROVENTIL) (2.5 MG/3ML) 0.083% nebulizer solution INHALE CONTENTS OF 1 VIAL BY MOUTH THROUGH NEBULIZER EVERY 4 TO 6 HOURS AS NEEDED FOR SHORTNESS OF BREATH. 300 mL 12 prn at prn  . allopurinol (ZYLOPRIM) 300 MG tablet TAKE 1 TABLET BY MOUTH ONCE DAILY 30 tablet 2 May 15, 2017 at am  . amLODipine (NORVASC) 10 MG tablet TAKE 1 TABLET BY MOUTH ONCE DAILY 30 tablet 2 05/02/2017 at am  . budesonide-formoterol (SYMBICORT) 160-4.5 MCG/ACT inhaler Inhale 2 puffs into the lungs 2 (two) times daily.   05/15/17 at am  . cetirizine (ZYRTEC) 10 MG tablet once daily.   05-15-2017 at am  . levothyroxine (SYNTHROID, LEVOTHROID) 100 MCG tablet TAKE 1 TABLET BY MOUTH ONCE DAILY ON AN EMPTY STOMACH. WAIT 30 MINUTES BEFORE TAKING OTHER MEDS. 30 tablet 1 05/15/2017 at am  . LORazepam (ATIVAN) 0.5 MG tablet Take 1 tablet  (0.5 mg total) by mouth every 8 (eight) hours as needed (CIWA-AR > 8-OR-withdrawal symptoms:anxiety, agitation, insomnia, diaphoresis, nausea, vomiting, tremors, tachycardia, or hypertension.). 15 tablet 0 prn at prn  . Multiple Vitamin (MULTIVITAMIN WITH MINERALS) TABS tablet Take 1 tablet by mouth daily. 30 tablet 0 05-15-2017 at am  . PROVENTIL HFA 108 (90 Base) MCG/ACT inhaler INHALE 2 PUFFS BY MOUTH EVERY 6 HOURS AS NEEDED FOR WHEEZING OR SHORTNESS OF BREATH. 6.7 g 1 prn at prn  . valsartan (DIOVAN) 160 MG tablet Take 1 tablet (160 mg total) by mouth daily. 30 tablet 0 05/02/2017 at am  . vancomycin (VANCOCIN) 50 mg/mL oral solution Take 5 mLs (250 mg total) by mouth every 6 (six) hours. 160 mL 0 05-15-17 at am  . feeding supplement, ENSURE ENLIVE, (ENSURE ENLIVE) LIQD Take 237 mLs by mouth 2 (two) times daily between meals. 237 mL 12    Scheduled:  . [START ON 05/04/2017] allopurinol  300 mg Oral Daily  . [START ON 05/04/2017] amiodarone  400 mg Oral Q12H   Followed by  . [START ON 05/11/2017] amiodarone  400 mg Oral Daily  . [START ON 05/04/2017] amLODipine  10 mg Oral Daily  . feeding supplement (ENSURE ENLIVE)  237 mL Oral BID BM  . Melene Muller  ON 05/04/2017] levothyroxine  100 mcg Oral Q0600  . multivitamin with minerals  1 tablet Oral Daily  . potassium chloride  40 mEq Oral Once  . vancomycin  250 mg Oral Q6H   Infusions:  . [START ON 05/04/2017] ceFEPime (MAXIPIME) IV    . diltiazem (CARDIZEM) infusion 10 mg/hr (2016/12/21 2048)  . heparin 1,700 Units/hr (2016/12/21 1621)  . piperacillin-tazobactam    . potassium chloride 10 mEq (2016/12/21 2307)  . sodium chloride      Assessment: Pharmacy consulted to dose and monitor heparin drip in this 66 year old male for a PE. Patient was not taking anticoagulants prior to admission and baseline labs were obtained.   Goal of Therapy:  Heparin level 0.3-0.7 units/ml Monitor platelets by anticoagulation protocol: Yes   Plan:  Give 6300 units  bolus x 1 Start heparin infusion at 1700 units/hr Check anti-Xa level in 6 hours and daily while on heparin Continue to monitor H&H and platelets  6/26 2230 heparin level 0.47. Continue current regimen. Recheck heparin level and CBC with tomorrow AM labs.  Erich MontaneMcBane,Derreon Consalvo S, PharmD Clinical Pharmacist 07-08-17,11:08 PM

## 2017-05-03 NOTE — ED Notes (Signed)
Pt very agitated, cussing and yelling, asking to sit on edge of bed, pt assisted to dangle legs, pt yelling at family

## 2017-05-03 NOTE — ED Notes (Signed)
Pt's family member to desk requesting some gingerale for patient. Informed family member that pt was going to ICU and could have something to drink up there. Pt's family member states "well, he needs something to drink so he can take something". This RN informed family member that patient could not take any home medications. Family then requesting gingerale "maybe I'm going to take some medications". Family rude to nursing staff at desk, patient yelling curse words from inside room.

## 2017-05-03 NOTE — ED Notes (Signed)
Pt states feeling "bad" for 1 week, states productive cough with SOB and chest tightness, states recent pneumonia and pneumothorax with chest tube placement, pt awake and alert upon arrival, tachypenic, pale, SOB, placed on o2

## 2017-05-03 NOTE — ED Notes (Signed)
Pt states increased SOB, pt wheezing in bilateral lung fields upon assessment, EDP made aware, duoneb ordered

## 2017-05-03 NOTE — ED Notes (Addendum)
[facility] and patient's family members [facility] ICU. Pt still cursing at [employer].

## 2017-05-03 NOTE — Progress Notes (Signed)
ANTICOAGULATION CONSULT NOTE - Initial Consult  Pharmacy Consult for heparin Indication: pulmonary embolus  Allergies  Allergen Reactions  . Toprol Xl [Metoprolol Tartrate] Other (See Comments)    Bradycardia    Patient Measurements: Height: 6\' 1"  (185.4 cm) Weight: 255 lb 15.3 oz (116.1 kg) IBW/kg (Calculated) : 79.9 Heparin Dosing Weight: 105 kg  Vital Signs: Temp: 97.9 F (36.6 C) (06/26 1555) Temp Source: Oral (06/26 1555) BP: 101/59 (06/26 1430) Pulse Rate: 152 (06/26 1430)  Labs:  Recent Labs  05/10/17 1136  HGB 12.9*  HCT 36.8*  PLT 198  LABPROT 14.8  INR 1.15  CREATININE 1.10  TROPONINI <0.03    Estimated Creatinine Clearance: 89.4 mL/min (by C-G formula based on SCr of 1.1 mg/dL).   Medical History: Past Medical History:  Diagnosis Date  . Anxiety   . Asthma   . Depression   . Gout   . Hypertension     Medications:  Prescriptions Prior to Admission  Medication Sig Dispense Refill Last Dose  . albuterol (PROVENTIL) (2.5 MG/3ML) 0.083% nebulizer solution INHALE CONTENTS OF 1 VIAL BY MOUTH THROUGH NEBULIZER EVERY 4 TO 6 HOURS AS NEEDED FOR SHORTNESS OF BREATH. 300 mL 12 prn at prn  . allopurinol (ZYLOPRIM) 300 MG tablet TAKE 1 TABLET BY MOUTH ONCE DAILY 30 tablet 2 May 10, 2017 at am  . amLODipine (NORVASC) 10 MG tablet TAKE 1 TABLET BY MOUTH ONCE DAILY 30 tablet 2 05/02/2017 at am  . budesonide-formoterol (SYMBICORT) 160-4.5 MCG/ACT inhaler Inhale 2 puffs into the lungs 2 (two) times daily.   05-10-2017 at am  . cetirizine (ZYRTEC) 10 MG tablet once daily.   May 10, 2017 at am  . levothyroxine (SYNTHROID, LEVOTHROID) 100 MCG tablet TAKE 1 TABLET BY MOUTH ONCE DAILY ON AN EMPTY STOMACH. WAIT 30 MINUTES BEFORE TAKING OTHER MEDS. 30 tablet 1 2017/05/10 at am  . LORazepam (ATIVAN) 0.5 MG tablet Take 1 tablet (0.5 mg total) by mouth every 8 (eight) hours as needed (CIWA-AR > 8-OR-withdrawal symptoms:anxiety, agitation, insomnia, diaphoresis, nausea,  vomiting, tremors, tachycardia, or hypertension.). 15 tablet 0 prn at prn  . Multiple Vitamin (MULTIVITAMIN WITH MINERALS) TABS tablet Take 1 tablet by mouth daily. 30 tablet 0 2017-05-10 at am  . PROVENTIL HFA 108 (90 Base) MCG/ACT inhaler INHALE 2 PUFFS BY MOUTH EVERY 6 HOURS AS NEEDED FOR WHEEZING OR SHORTNESS OF BREATH. 6.7 g 1 prn at prn  . valsartan (DIOVAN) 160 MG tablet Take 1 tablet (160 mg total) by mouth daily. 30 tablet 0 05/02/2017 at am  . vancomycin (VANCOCIN) 50 mg/mL oral solution Take 5 mLs (250 mg total) by mouth every 6 (six) hours. 160 mL 0 2017/05/10 at am  . feeding supplement, ENSURE ENLIVE, (ENSURE ENLIVE) LIQD Take 237 mLs by mouth 2 (two) times daily between meals. 237 mL 12    Scheduled:  . [START ON 05/04/2017] allopurinol  300 mg Oral Daily  . [START ON 05/04/2017] amiodarone  400 mg Oral Q12H   Followed by  . [START ON 05/11/2017] amiodarone  400 mg Oral Daily  . amLODipine  10 mg Oral Daily  . feeding supplement (ENSURE ENLIVE)  237 mL Oral BID BM  . [START ON 05/04/2017] levothyroxine  100 mcg Oral Q0600  . multivitamin with minerals  1 tablet Oral Daily  . potassium chloride  40 mEq Oral Once  . vancomycin  250 mg Oral Q6H   Infusions:  . amiodarone 150 mg (May 10, 2017 1636)  . ceFEPime (MAXIPIME) IV    . diltiazem (  CARDIZEM) infusion    . heparin 1,700 Units/hr (05/01/2017 1621)  . piperacillin-tazobactam    . sodium chloride      Assessment: Pharmacy consulted to dose and monitor heparin drip in this 66 year old male for a PE. Patient was not taking anticoagulants prior to admission and baseline labs were obtained.   Goal of Therapy:  Heparin level 0.3-0.7 units/ml Monitor platelets by anticoagulation protocol: Yes   Plan:  Give 6300 units bolus x 1 Start heparin infusion at 1700 units/hr Check anti-Xa level in 6 hours and daily while on heparin Continue to monitor H&H and platelets  Cindi CarbonMary M Marketa Midkiff, PharmD Clinical Pharmacist 04/23/2017,4:40 PM

## 2017-05-03 NOTE — ED Notes (Signed)
Pt insistent to get out of bed to get to toilet despite being education about his high HR. Pt assisted to bedside commode by this RN. Sample obtained with urinal. Pt back in bed at this time, on all monitors. Cardizem not available from pharmacy at this time.

## 2017-05-03 NOTE — Progress Notes (Signed)
Spoke with Pharmacy until to send PO Amiodarone. E-Link RN made aware, states will speak with MD and clarify orders.

## 2017-05-03 NOTE — ED Notes (Signed)
Pt HR 150s, pt tachypenic and labored, orders for cardizem bolus received

## 2017-05-03 NOTE — Consult Note (Signed)
PULMONARY / CRITICAL CARE MEDICINE   Name: Arthur Conner MRN: [id number] DOB: January 12, 1951    ADMISSION DATE:  04/14/2017  REFERRING MD:  Amado Coe  PT PROFILE: 22 M with 2 prior hospitalizations, most recently discharged 06/19 to a rehab SNF presented to the emergency department with acute dyspnea of less than 24 hours duration. Also found to have new onset atrial fibrillation with rapid ventricular response. Code sepsis activated. Left lower extremity edema noted. Concern for pulmonary embolism raised.  HISTORY OF PRESENT ILLNESS:   As above. He denies pleuritic and anginal chest pain. He has had cough productive of minimal clear to white mucus. He denies hemoptysis. During the onset of this shortness of breath, the evening prior to presentation, he received several treatments with nebulized bronchodilators. This did not relieve his shortness of breath. The lower extremity edema was first noted on the day prior to presentation. His first hospitalization this month was for severe hyponatremia, frequent falls and alcohol withdrawal. His second hospitalization this month was for severe sepsis due to C. difficile colitis. During that hospitalization he suffered a spontaneous right pneumothorax.  PAST MEDICAL HISTORY :  He  has a past medical history of Anxiety; Asthma; Depression; Gout; and Hypertension.  PAST SURGICAL HISTORY: He  has a past surgical history that includes Appendectomy and Tonsillectomy.  Allergies  Allergen Reactions  . Toprol Xl [Metoprolol Tartrate] Other (See Comments)    Bradycardia    No current facility-administered medications on file prior to encounter.    Current Outpatient Prescriptions on File Prior to Encounter  Medication Sig  . albuterol (PROVENTIL) (2.5 MG/3ML) 0.083% nebulizer solution INHALE CONTENTS OF 1 VIAL BY MOUTH THROUGH NEBULIZER EVERY 4 TO 6 HOURS AS NEEDED FOR SHORTNESS OF BREATH.  Marland Kitchen allopurinol (ZYLOPRIM) 300 MG tablet TAKE 1 TABLET BY MOUTH  ONCE DAILY  . amLODipine (NORVASC) 10 MG tablet TAKE 1 TABLET BY MOUTH ONCE DAILY  . budesonide-formoterol (SYMBICORT) 160-4.5 MCG/ACT inhaler Inhale 2 puffs into the lungs 2 (two) times daily.  . cetirizine (ZYRTEC) 10 MG tablet once daily.  Marland Kitchen levothyroxine (SYNTHROID, LEVOTHROID) 100 MCG tablet TAKE 1 TABLET BY MOUTH ONCE DAILY ON AN EMPTY STOMACH. WAIT 30 MINUTES BEFORE TAKING OTHER MEDS.  Marland Kitchen LORazepam (ATIVAN) 0.5 MG tablet Take 1 tablet (0.5 mg total) by mouth every 8 (eight) hours as needed (CIWA-AR > 8-OR-withdrawal symptoms:anxiety, agitation, insomnia, diaphoresis, nausea, vomiting, tremors, tachycardia, or hypertension.).  Marland Kitchen Multiple Vitamin (MULTIVITAMIN WITH MINERALS) TABS tablet Take 1 tablet by mouth daily.  Marland Kitchen PROVENTIL HFA 108 (90 Base) MCG/ACT inhaler INHALE 2 PUFFS BY MOUTH EVERY 6 HOURS AS NEEDED FOR WHEEZING OR SHORTNESS OF BREATH.  . valsartan (DIOVAN) 160 MG tablet Take 1 tablet (160 mg total) by mouth daily.  . vancomycin (VANCOCIN) 50 mg/mL oral solution Take 5 mLs (250 mg total) by mouth every 6 (six) hours.  . feeding supplement, ENSURE ENLIVE, (ENSURE ENLIVE) LIQD Take 237 mLs by mouth 2 (two) times daily between meals.    FAMILY HISTORY:  His indicated that his mother is deceased. He indicated that his father is deceased. He indicated that both of his sisters are alive. He indicated that his maternal grandmother is deceased. He indicated that his maternal grandfather is deceased. He indicated that his paternal grandmother is deceased. He indicated that his paternal grandfather is deceased. He indicated that his daughter is alive. He indicated that his son is alive.    SOCIAL HISTORY: He  reports that he has been smoking Cigarettes.  He has been smoking about 0.50 packs per day. He has never used smokeless tobacco. He reports that he drinks alcohol. He reports that he does not use drugs.  REVIEW OF SYSTEMS:   No change in cognition. No head and neck symptoms. No  nausea vomiting diarrhea. No dysuria.  SUBJECTIVE:    VITAL SIGNS: BP (!) 101/59   Pulse (!) 152   Temp 97.9 F (36.6 C) (Oral)   Resp 18   Ht 6\' 1"  (1.854 m)   Wt 255 lb 15.3 oz (116.1 kg)   SpO2 100%   BMI 33.77 kg/m   HEMODYNAMICS:    VENTILATOR SETTINGS:    INTAKE / OUTPUT: No intake/output data recorded.  PHYSICAL EXAMINATION: General: Chronically ill-appearing, mildly dyspneic at rest, anxious appearing. Neuro: Cranial nerves intact, motor and sensory intact, deep tendon reflexes symmetric HEENT: NCAT, sclerae white, oropharynx benign Cardiovascular: Tachy, IRIR, no murmur noted Lungs: Breath sounds slightly diminished in left base, no wheezes or other adventitious sounds Abdomen: Soft, nontender, bowel sounds present Extremities: Multiple healing areas of bruising, asymmetric L > R pretibial and ankle edema Skin: Multiple ecchymoses in various stages of healing  LABS:  BMET  Recent Labs Lab 04/19/2017 1136  NA 135  K 3.1*  CL 106  CO2 18*  BUN 9  CREATININE 1.10  GLUCOSE 160*    Electrolytes  Recent Labs Lab 04/24/2017 1136  CALCIUM 8.4*    CBC  Recent Labs Lab 04/22/2017 1136  WBC 12.1*  HGB 12.9*  HCT 36.8*  PLT 198    Coag's  Recent Labs Lab 04/24/2017 1136  INR 1.15    Sepsis Markers  Recent Labs Lab 04/20/2017 1145 05/02/2017 1445  LATICACIDVEN 2.8* 3.6*    ABG No results for input(s): PHART, PCO2ART, PO2ART in the last 168 hours.  Liver Enzymes  Recent Labs Lab 04/20/2017 1136  AST 29  ALT 38  ALKPHOS 51  BILITOT 1.3*  ALBUMIN 3.5    Cardiac Enzymes  Recent Labs Lab 05/04/2017 1136  TROPONINI <0.03    Glucose  Recent Labs Lab 04/29/2017 1549  GLUCAP 114*    Imaging Dg Chest Port 1 View  Result Date: 04/26/2017 CLINICAL DATA:  Chest pain and shortness of breath since a fall 2 weeks ago. EXAM: PORTABLE CHEST 1 VIEW COMPARISON:  CT scan and chest x-ray dated 04/24/2017 FINDINGS: There has been  progression of patchy bilateral pulmonary infiltrates since the prior CT scan. Infiltrates more prominent on the left than the right. Heart size and pulmonary vascularity appear normal. Emphysema was present on the prior CT scan. IMPRESSION: Progressive bilateral pulmonary infiltrates, left greater than right. Emphysema. Electronically Signed   By: Francene BoyersJames  Maxwell M.D.   On: 04/13/2017 12:05      ASSESSMENT / PLAN: 2 recent hospitalizations as noted above Recent C. difficile colitis - has nearly completed treatment course Multiple recent falls Acute respiratory distress Nonspecific bilateral pulmonary infiltrates, left greater than right  Concern for possible HCAP New onset AF RVR Asymmetric lower extremity edema   The differential diagnosis includes severe sepsis but I am more concerned about the possibility of pulmonary embolism. He has asymmetric lower extremity edema. He has had recent hospitalizations and lower extremity trauma (frequent falls).   1) heparin bolus and infusion ordered 2) continue current antibiotics for now 3) pro-calcitonin ordered. If not elevated, we consider discontinuation of antibiotics 4) repeat amiodarone bolus and amiodarone load (by mouth) ordered 5) diltiazem infusion to maintain heart rate 75-115/min 6) CTA  chest ordered 7) lower extremity venous ultrasound ordered   Billy Fischer, MD PCCM service Mobile [phone] Pager [phone] 05-18-2017 4:41 PM

## 2017-05-03 NOTE — ED Notes (Signed)
Lactic acid 2.8 verbal readback to Dr. Mayford KnifeWilliams in person. No further orders received. Awaiting Cardizem from pharmacy.

## 2017-05-03 NOTE — Progress Notes (Signed)
Lactic acid of 2.9 called to [place] NP. No new orders received.

## 2017-05-03 NOTE — ED Notes (Signed)
Pt placed on 2L [place]

## 2017-05-03 NOTE — Progress Notes (Signed)
Chaplain received a page to come and talk with the patient For comfort care / prayer. Once Chaplain arrived patient was Not in the room, patient had gone to CT scan.

## 2017-05-03 NOTE — H&P (Signed)
[facility] [place] [facility]   PATIENT NAME: Arthur Conner    MR#:  [id number]  DATE OF BIRTH:  08-05-1951  DATE OF ADMISSION:  2017-10-25  PRIMARY CARE PHYSICIAN: Steele Sizerrissman, Mark A, MD   REQUESTING/REFERRING PHYSICIAN: williams  CHIEF COMPLAINT:   sob, Tachycardia HISTORY OF PRESENT ILLNESS:  Arthur Conner  is a 66 y.o. male with a known history of Anxiety, asthma, gout, hypertension, chronic atrial fibrillation and multiple other medical problems went to see his primary care physician Dr. Graciela HusbandsKlein. Patient was admitted to [facility] 2 times in the past month once it pneumothorax and the second time with sepsis with C. difficile colitis and was discharged with by mouth vancomycin the course will be completed tomorrow,at doctor's office patient was short of breath and tachycardic. Patient was found to be septic with elevated white blood cell count and lactic acid and also went into atrial fibrillation with RVR. Started on Cardizem drip and transferred to IV antibiotics  PAST MEDICAL HISTORY:   Past Medical History:  Diagnosis Date  . Anxiety   . Asthma   . Depression   . Gout   . Hypertension     PAST SURGICAL HISTOIRY:   Past Surgical History:  Procedure Laterality Date  . APPENDECTOMY    . TONSILLECTOMY      SOCIAL HISTORY:   Social History  Substance Use Topics  . Smoking status: Current Every Day Smoker    Packs/day: 0.50    Types: Cigarettes    Last attempt to quit: 08/11/1990  . Smokeless tobacco: Never Used  . Alcohol use 0.0 oz/week     Comment: "I'm not even sure how much I drink"    FAMILY HISTORY:   Family History  Problem Relation Age of Onset  . Lung cancer Father   . Aneurysm Mother     DRUG ALLERGIES:   Allergies  Allergen Reactions  . Toprol Xl [Metoprolol Tartrate] Other (See Comments)    Bradycardia    REVIEW OF SYSTEMS:  CONSTITUTIONAL: No fever, fatigue or weakness.  EYES: No  blurred or double vision.  EARS, NOSE, AND THROAT: No tinnitus or ear pain.  RESPIRATORY: Reporting cough, shortness of breath, no wheezing or hemoptysis.  CARDIOVASCULAR: Reporting palpitations No chest pain, orthopnea, edema.  GASTROINTESTINAL: No nausea, vomiting, diarrhea or abdominal pain.  GENITOURINARY: No dysuria, hematuria.  ENDOCRINE: No polyuria, nocturia,  HEMATOLOGY: No anemia, easy bruising or bleeding SKIN: No rash or lesion. MUSCULOSKELETAL: Reports lower lumbar extremity pain on the left side No joint pain or arthritis.   NEUROLOGIC: No tingling, numbness, weakness.  PSYCHIATRY: No anxiety or depression.   MEDICATIONS AT HOME:   Prior to Admission medications   Medication Sig Start Date End Date Taking? Authorizing Provider  albuterol (PROVENTIL) (2.5 MG/3ML) 0.083% nebulizer solution INHALE CONTENTS OF 1 VIAL BY MOUTH THROUGH NEBULIZER EVERY 4 TO 6 HOURS AS NEEDED FOR SHORTNESS OF BREATH. 09/08/15  Yes Crissman, Redge GainerMark A, MD  allopurinol (ZYLOPRIM) 300 MG tablet TAKE 1 TABLET BY MOUTH ONCE DAILY 10/18/16  Yes Crissman, Mark A, MD  amLODipine (NORVASC) 10 MG tablet TAKE 1 TABLET BY MOUTH ONCE DAILY 12/22/15  Yes Crissman, Redge GainerMark A, MD  budesonide-formoterol (SYMBICORT) 160-4.5 MCG/ACT inhaler Inhale 2 puffs into the lungs 2 (two) times daily.   Yes [provider]  cetirizine (ZYRTEC) 10 MG tablet once daily.   Yes [provider]  levothyroxine (SYNTHROID, LEVOTHROID) 100 MCG tablet TAKE 1 TABLET BY MOUTH  ONCE DAILY ON AN EMPTY STOMACH. WAIT 30 MINUTES BEFORE TAKING OTHER MEDS. 08/16/16  Yes Crissman, Redge Gainer, MD  LORazepam (ATIVAN) 0.5 MG tablet Take 1 tablet (0.5 mg total) by mouth every 8 (eight) hours as needed (CIWA-AR > 8-OR-withdrawal symptoms:anxiety, agitation, insomnia, diaphoresis, nausea, vomiting, tremors, tachycardia, or hypertension.). 04/26/17  Yes Enedina Finner, MD  Multiple Vitamin (MULTIVITAMIN WITH MINERALS) TABS tablet Take 1 tablet by  mouth daily. 04/27/17  Yes Enedina Finner, MD  PROVENTIL HFA 108 [phone] Base) MCG/ACT inhaler INHALE 2 PUFFS BY MOUTH EVERY 6 HOURS AS NEEDED FOR WHEEZING OR SHORTNESS OF BREATH. 01/24/17  Yes Crissman, Redge Gainer, MD  valsartan (DIOVAN) 160 MG tablet Take 1 tablet (160 mg total) by mouth daily. 04/26/17  Yes Enedina Finner, MD  vancomycin (VANCOCIN) 50 mg/mL oral solution Take 5 mLs (250 mg total) by mouth every 6 (six) hours. 04/26/17 05/04/17 Yes Enedina Finner, MD  feeding supplement, ENSURE ENLIVE, (ENSURE ENLIVE) LIQD Take 237 mLs by mouth 2 (two) times daily between meals. 04/22/17   Delfino Lovett, MD      VITAL SIGNS:  Blood pressure (!) 130/96, pulse (!) 139, temperature 97.5 F (36.4 C), temperature source Oral, resp. rate (!) 34, SpO2 96 %.  PHYSICAL EXAMINATION:  GENERAL:  66 y.o.-year-old patient lying in the bed with no acute distress.  EYES: Pupils equal, round, reactive to light and accommodation. No scleral icterus. Extraocular muscles intact.  HEENT: Head atraumatic, normocephalic. Oropharynx and nasopharynx clear.  NECK:  Supple, no jugular venous distention. No thyroid enlargement, no tenderness.  LUNGS: Moderately diminished breath sounds bilaterally, no wheezing, rales,rhonchi . Positive crepitation No use of accessory muscles of respiration.  CARDIOVASCULAR: Irregularly irregular. No murmurs, rubs, or gallops.  ABDOMEN: Soft, nontender, nondistended. Bowel sounds present. No organomegaly or mass.  EXTREMITIES: No pedal edema, cyanosis, or clubbing.  NEUROLOGIC: Cranial nerves II through XII are intact. Muscle strength 5/5 in all extremities. Sensation intact. Gait not checked.  PSYCHIATRIC: The patient is alert and oriented x 3.  SKIN: No obvious rash, lesion, or ulcer.   LABORATORY PANEL:   CBC  Recent Labs Lab 05/10/17 1136  WBC 12.1*  HGB 12.9*  HCT 36.8*  PLT 198    ------------------------------------------------------------------------------------------------------------------  Chemistries   Recent Labs Lab 2017-05-10 1136  NA 135  K 3.1*  CL 106  CO2 18*  GLUCOSE 160*  BUN 9  CREATININE 1.10  CALCIUM 8.4*  AST 29  ALT 38  ALKPHOS 51  BILITOT 1.3*   ------------------------------------------------------------------------------------------------------------------  Cardiac Enzymes  Recent Labs Lab May 10, 2017 1136  TROPONINI <0.03   ------------------------------------------------------------------------------------------------------------------  RADIOLOGY:  Dg Chest Port 1 View  Result Date: 2017/05/10 CLINICAL DATA:  Chest pain and shortness of breath since a fall 2 weeks ago. EXAM: PORTABLE CHEST 1 VIEW COMPARISON:  CT scan and chest x-ray dated 04/24/2017 FINDINGS: There has been progression of patchy bilateral pulmonary infiltrates since the prior CT scan. Infiltrates more prominent on the left than the right. Heart size and pulmonary vascularity appear normal. Emphysema was present on the prior CT scan. IMPRESSION: Progressive bilateral pulmonary infiltrates, left greater than right. Emphysema. Electronically Signed   By: Francene Boyers M.D.   On: 05/10/17 12:05    EKG:   Orders placed or performed during the hospital encounter of 05/10/17  . ED EKG within 10 minutes  . ED EKG within 10 minutes    IMPRESSION AND PLAN:   Arthur Conner  is a 66 y.o. male with a known history  of Anxiety, asthma, gout, hypertension, chronic atrial fibrillation and multiple other medical problems went to see his primary care physician Dr. Graciela Husbands. Patient was admitted to [facility] 2 times in the past month once it pneumothorax and the second time with sepsis with C. difficile colitis and was discharged with by mouth vancomycin the course will be completed tomorrow,at doctor's office patient was short of breath and  tachycardic  #Sepsis secondary to healthcare associated pneumonia Patient will be admitted to intensive care unit-stepdown unit Meets septic criteria with leukocytosis, elevated lactic acid, tachycardia and hypotension We will get sputum culture and sensitivity IV fluids Broad-spectrum IV antibiotics cefepime and vancomycin Consults placed to pulmonology discussed with Dr. Darrol Angel  #A. fib with RVR Currently on Cardizem drip titrate for heart rate 100 Patient has received 1 dose of amiodarone Cardiac consult placed  #Hypokalemia Replete potassium repeat BMP check magnesium  #Hypothyroidism continue Synthroid  #Left lower extremity pain Left lower extremity venous Dopplers   All the records are reviewed and case discussed with ED provider. Management plans discussed with the patient, family and they are in agreement.  CODE STATUS:fc ,son HCPOA  TOTAL CRITICAL TIME TAKING CARE OF THIS PATIENT: 45 minutes.   Note: This dictation was prepared with Dragon dictation along with smaller phrase technology. Any transcriptional errors that result from this process are unintentional.  Ramonita Lab M.D on 05/04/17 at 2:30 PM  Between 7am to 6pm - Pager - [phone]  After 6pm go to [email or web address] - password [email or web address]  [place] [facility]  Office  [phone]  CC: Primary care physician; Steele Sizer, MD

## 2017-05-03 NOTE — Progress Notes (Signed)
Radiology called with CT scan results. Results given to [facility], [facility] NP. Patient to be treated with thromobolytics per NP.

## 2017-05-04 ENCOUNTER — Inpatient Hospital Stay: Payer: Medicare Other

## 2017-05-04 DIAGNOSIS — Z66 Do not resuscitate: Secondary | ICD-10-CM

## 2017-05-04 DIAGNOSIS — Z515 Encounter for palliative care: Secondary | ICD-10-CM

## 2017-05-04 DIAGNOSIS — I2699 Other pulmonary embolism without acute cor pulmonale: Secondary | ICD-10-CM

## 2017-05-04 DIAGNOSIS — Z7189 Other specified counseling: Secondary | ICD-10-CM

## 2017-05-04 LAB — COMPREHENSIVE METABOLIC PANEL
ALT: 28 U/L (ref 17–63)
AST: 22 U/L (ref 15–41)
Albumin: 2.8 g/dL — ABNORMAL LOW (ref 3.5–5.0)
Alkaline Phosphatase: 41 U/L (ref 38–126)
Anion gap: 6 (ref 5–15)
BILIRUBIN TOTAL: 1 mg/dL (ref 0.3–1.2)
BUN: 8 mg/dL (ref 6–20)
CHLORIDE: 112 mmol/L — AB (ref 101–111)
CO2: 22 mmol/L (ref 22–32)
CREATININE: 0.97 mg/dL (ref 0.61–1.24)
Calcium: 7.8 mg/dL — ABNORMAL LOW (ref 8.9–10.3)
GFR calc Af Amer: >60 mL/min (ref >60)
GLUCOSE: 101 mg/dL — AB (ref 65–99)
Potassium: 2.8 mmol/L — ABNORMAL LOW (ref 3.5–5.1)
Sodium: 140 mmol/L (ref 135–145)
Total Protein: 5.4 g/dL — ABNORMAL LOW (ref 6.5–8.1)

## 2017-05-04 LAB — CBC
HEMATOCRIT: 34.4 % — AB (ref 40.0–52.0)
Hemoglobin: 11.9 g/dL — ABNORMAL LOW (ref 13.0–18.0)
MCH: 33.6 pg (ref 26.0–34.0)
MCHC: 34.7 g/dL (ref 32.0–36.0)
MCV: 96.9 fL (ref 80.0–100.0)
PLATELETS: 136 10*3/uL — AB (ref 150–440)
RBC: 3.55 MIL/uL — AB (ref 4.40–5.90)
RDW: 16.2 % — ABNORMAL HIGH (ref 11.5–14.5)
WBC: 9.2 10*3/uL (ref 3.8–10.6)

## 2017-05-04 LAB — HEPARIN LEVEL (UNFRACTIONATED)
HEPARIN UNFRACTIONATED: 0.57 [IU]/mL (ref 0.30–0.70)
Heparin Unfractionated: 0.21 IU/mL — ABNORMAL LOW (ref 0.30–0.70)

## 2017-05-04 LAB — URINE CULTURE: Culture: <10000 — AB

## 2017-05-04 LAB — MAGNESIUM: MAGNESIUM: 1.8 mg/dL (ref 1.7–2.4)

## 2017-05-04 LAB — APTT: aPTT: 76 seconds — ABNORMAL HIGH (ref 24–36)

## 2017-05-04 LAB — PROCALCITONIN: Procalcitonin: <0.1 ng/mL

## 2017-05-04 MED ORDER — GUAIFENESIN-DM 100-10 MG/5ML PO SYRP
10.0000 mL | ORAL_SOLUTION | ORAL | Status: DC | PRN
  Administered 2017-05-04: 10 mL via ORAL
  Filled 2017-05-04 (×2): qty 10

## 2017-05-04 MED ORDER — RISAQUAD PO CAPS
1.0000 | ORAL_CAPSULE | Freq: Two times a day (BID) | ORAL | Status: DC
  Filled 2017-05-04 (×2): qty 1

## 2017-05-04 MED ORDER — FENTANYL CITRATE (PF) 100 MCG/2ML IJ SOLN
INTRAMUSCULAR | Status: AC
  Filled 2017-05-04: qty 4

## 2017-05-04 MED ORDER — MORPHINE SULFATE (PF) 2 MG/ML IV SOLN
2.0000 mg | INTRAVENOUS | Status: DC | PRN
  Administered 2017-05-05: 5 mg via INTRAVENOUS

## 2017-05-04 MED ORDER — STERILE WATER FOR INJECTION IJ SOLN
INTRAMUSCULAR | Status: AC
  Filled 2017-05-04: qty 10

## 2017-05-04 MED ORDER — LORAZEPAM 2 MG/ML IJ SOLN
4.0000 mg | INTRAMUSCULAR | Status: DC | PRN
  Administered 2017-05-04 – 2017-05-06 (×8): 4 mg via INTRAVENOUS
  Filled 2017-05-04 (×7): qty 2

## 2017-05-04 MED ORDER — BUDESONIDE 0.5 MG/2ML IN SUSP
0.5000 mg | Freq: Two times a day (BID) | RESPIRATORY_TRACT | Status: DC
  Administered 2017-05-04: 0.5 mg via RESPIRATORY_TRACT
  Filled 2017-05-04: qty 2

## 2017-05-04 MED ORDER — LORAZEPAM 2 MG/ML IJ SOLN
1.0000 mg | INTRAMUSCULAR | Status: DC | PRN
  Administered 2017-05-04: 2 mg via INTRAVENOUS
  Filled 2017-05-04: qty 1

## 2017-05-04 MED ORDER — POTASSIUM CHLORIDE 10 MEQ/100ML IV SOLN
10.0000 meq | INTRAVENOUS | Status: AC
  Administered 2017-05-04 (×5): 10 meq via INTRAVENOUS
  Filled 2017-05-04 (×5): qty 100

## 2017-05-04 MED ORDER — MORPHINE SULFATE (PF) 2 MG/ML IV SOLN
2.0000 mg | Freq: Once | INTRAVENOUS | Status: AC
  Administered 2017-05-04: 2 mg via INTRAVENOUS
  Filled 2017-05-04: qty 1

## 2017-05-04 MED ORDER — SODIUM CHLORIDE 0.9 % IV SOLN
0.4000 ug/kg/h | INTRAVENOUS | Status: DC
  Administered 2017-05-04: 0.4 ug/kg/h via INTRAVENOUS
  Filled 2017-05-04 (×3): qty 2

## 2017-05-04 MED ORDER — MIDAZOLAM HCL 2 MG/2ML IJ SOLN
INTRAMUSCULAR | Status: AC
  Filled 2017-05-04: qty 4

## 2017-05-04 MED ORDER — IPRATROPIUM-ALBUTEROL 0.5-2.5 (3) MG/3ML IN SOLN
3.0000 mL | RESPIRATORY_TRACT | Status: DC
  Administered 2017-05-04: 3 mL via RESPIRATORY_TRACT
  Filled 2017-05-04: qty 3

## 2017-05-04 MED ORDER — VECURONIUM BROMIDE 10 MG IV SOLR
INTRAVENOUS | Status: AC
  Filled 2017-05-04: qty 10

## 2017-05-04 MED ORDER — LORAZEPAM 2 MG/ML IJ SOLN
2.0000 mg | INTRAMUSCULAR | Status: DC | PRN
  Filled 2017-05-04: qty 2

## 2017-05-04 MED ORDER — HALOPERIDOL LACTATE 5 MG/ML IJ SOLN
5.0000 mg | Freq: Once | INTRAMUSCULAR | Status: AC
  Administered 2017-05-04: 5 mg via INTRAVENOUS
  Filled 2017-05-04: qty 1

## 2017-05-04 MED ORDER — ORAL CARE MOUTH RINSE: 15.0000 mL | Freq: Two times a day (BID) | OROMUCOSAL | Status: DC

## 2017-05-04 MED ORDER — HEPARIN BOLUS VIA INFUSION
1600.0000 [IU] | Freq: Once | INTRAVENOUS | Status: AC
  Administered 2017-05-04: 1600 [IU] via INTRAVENOUS
  Filled 2017-05-04: qty 1600

## 2017-05-04 MED ORDER — SODIUM CHLORIDE 0.9 % IV SOLN
2.0000 mg/h | INTRAVENOUS | Status: DC
  Administered 2017-05-04: 2 mg/h via INTRAVENOUS
  Administered 2017-05-05: 20 mg/h via INTRAVENOUS
  Administered 2017-05-05: 12 mg/h via INTRAVENOUS
  Administered 2017-05-05: 9 mg/h via INTRAVENOUS
  Administered 2017-05-06: 20 mg/h via INTRAVENOUS
  Filled 2017-05-04 (×7): qty 4

## 2017-05-04 MED ORDER — MORPHINE BOLUS VIA INFUSION
5.0000 mg | INTRAVENOUS | Status: DC | PRN
  Administered 2017-05-04: 2.5 mg via INTRAVENOUS
  Administered 2017-05-05: 4 mg via INTRAVENOUS
  Administered 2017-05-05: 6 mg via INTRAVENOUS
  Administered 2017-05-05: 5 mg via INTRAVENOUS
  Administered 2017-05-05: 2 mg via INTRAVENOUS
  Administered 2017-05-05: 20 mg via INTRAVENOUS
  Administered 2017-05-05: 10 mg via INTRAVENOUS
  Administered 2017-05-05 (×2): 20 mg via INTRAVENOUS
  Administered 2017-05-05: 4 mg via INTRAVENOUS
  Administered 2017-05-05: 20 mg via INTRAVENOUS
  Administered 2017-05-05: 4 mg via INTRAVENOUS
  Administered 2017-05-05: 17 mg via INTRAVENOUS
  Administered 2017-05-05 – 2017-05-06 (×5): 20 mg via INTRAVENOUS
  Filled 2017-05-04: qty 20

## 2017-05-04 MED ORDER — MAGNESIUM SULFATE 2 GM/50ML IV SOLN
2.0000 g | Freq: Once | INTRAVENOUS | Status: AC
  Administered 2017-05-04: 2 g via INTRAVENOUS
  Filled 2017-05-04: qty 50

## 2017-05-04 NOTE — Progress Notes (Signed)
Pt's morphine drip is ordered to run at 10mg /hr, I spoke to Dr. Belia HemanKasa and he stated to start it low and titirate up to comfort. Will start morphine drip at 2mg /hr and titrate to comfort per Dr. Belia HemanKasa.

## 2017-05-04 NOTE — Care Management (Signed)
RNCM to follow this case. Three admissions over last 6 months. Currently in ICU being placed on BiPAP for respiratory support.

## 2017-05-04 NOTE — Consult Note (Signed)
PULMONARY / CRITICAL CARE MEDICINE   Name: Arthur Conner MRN: [id number] DOB: 29-Nov-1950    ADMISSION DATE:  05/16/17  REFERRING MD:  Amado Coe  PT PROFILE: 19 M with 2 prior hospitalizations, most recently discharged 06/19 to a rehab SNF presented to the emergency department with acute dyspnea of less than 24 hours duration. Also found to have new onset atrial fibrillation with rapid ventricular response. Code sepsis activated. Left lower extremity edema noted. Concern for pulmonary embolism raised.  Synopsis As above. He denies pleuritic and anginal chest pain. He has had cough productive of minimal clear to white mucus. He denies hemoptysis. During the onset of this shortness of breath, the evening prior to presentation, he received several treatments with nebulized bronchodilators. This did not relieve his shortness of breath. The lower extremity edema was first noted on the day prior to presentation. His first hospitalization this month was for severe hyponatremia, frequent falls and alcohol withdrawal. His second hospitalization this month was for severe sepsis due to C. difficile colitis. During that hospitalization he suffered a spontaneous right pneumothorax.  History of present illness CT chest positive for PE On heparin infusion Slight shortness of breath this a.m. Alert and awake following commands   REVIEW OF SYSTEMS:   No change in cognition. No head and neck symptoms. No nausea vomiting diarrhea. No dysuria.   VITAL SIGNS: BP 123/79   Pulse (!) 134   Temp (!) 96.8 F (36 C) (Axillary)   Resp (!) 46   Ht 6\' 1"  (1.854 m)   Wt 255 lb 15.3 oz (116.1 kg)   SpO2 (!) 87%   BMI 33.77 kg/m    INTAKE / OUTPUT: I/O last 3 completed shifts: In: 1310.1 [I.V.:910.1; IV Piggyback:400] Out: 700 [Urine:700]  PHYSICAL EXAMINATION: General: Chronically ill-appearing, mildly dyspneic at rest, anxious appearing. Neuro: Cranial nerves intact, motor and sensory intact, deep  tendon reflexes symmetric HEENT: NCAT, sclerae white, oropharynx benign Cardiovascular: Tachy, IRIR, no murmur noted Lungs: Breath sounds slightly diminished in left base, no wheezes or other adventitious sounds Abdomen: Soft, nontender, bowel sounds present Extremities: Multiple healing areas of bruising, asymmetric L > R pretibial and ankle edema Skin: Multiple ecchymoses in various stages of healing  LABS:  BMET  Recent Labs Lab 05/16/2017 1136 05/16/2017 1853 05/04/17 0417  NA 135 138 140  K 3.1* 2.9* 2.8*  CL 106 110 112*  CO2 18* 19* 22  BUN 9 8 8   CREATININE 1.10 0.98 0.97  GLUCOSE 160* 90 101*    Electrolytes  Recent Labs Lab 05/16/17 1136 2017/05/16 1853 05/04/17 0417  CALCIUM 8.4* 7.8* 7.8*  MG  --   --  1.8    CBC  Recent Labs Lab May 16, 2017 1136 16-May-2017 1853 05/04/17 0417  WBC 12.1* 12.5* 9.2  HGB 12.9* 12.2* 11.9*  HCT 36.8* 35.2* 34.4*  PLT 198 171 136*    Coag's  Recent Labs Lab 16-May-2017 1136 05/16/17 1853 05/04/17 0417  APTT  --  148* 76*  INR 1.15 1.40  --     Sepsis Markers  Recent Labs Lab 05/16/17 1145 2017-05-16 1445 05-16-2017 1853 05/04/17 0417  LATICACIDVEN 2.8* 3.6* 2.9*  --   PROCALCITON  --   --  <0.10 <0.10    ABG No results for input(s): PHART, PCO2ART, PO2ART in the last 168 hours.  Liver Enzymes  Recent Labs Lab 16-May-2017 1136 2017-05-16 1853 05/04/17 0417  AST 29 27 22   ALT 38 32 28  ALKPHOS 51 41 41  BILITOT 1.3* 1.2 1.0  ALBUMIN 3.5 3.0* 2.8*    Cardiac Enzymes  Recent Labs Lab 04/18/2017 1136  TROPONINI <0.03    Glucose  Recent Labs Lab 04/27/2017 1549  GLUCAP 114*    Imaging CT chest 04/23/2017 Confirms diagnosis of PE   ASSESSMENT / PLAN:  66 year old white male admitted to the ICU for A. fib with RVR with newly diagnosed PE Recent C. difficile colitis - has nearly completed treatment course Multiple recent falls Acute respiratory distress Nonspecific bilateral pulmonary infiltrates,  left greater than right  Concern for possible HCAP New onset AF RVR Asymmetric lower extremity edema +PE   1) heparin bolus and infusion ordered 2) continue current antibiotics for now 3) pro-calcitonin ordered-noted to be within normal limits. Will stop Antibiotics 4) continue amiodarone oral dose 5) diltiazem infusion to maintain heart rate 75-115/min 6) lower extremity venous ultrasound ordered  Patient remains stepdown status   Lucie LeatherKurian David Adileny Delon, M.D.  [facility]  Medical Director [facility] Medical Director [facility] Card[facility]

## 2017-05-04 NOTE — Progress Notes (Signed)
ANTICOAGULATION CONSULT NOTE   Pharmacy Consult for heparin Indication: pulmonary embolus   Pharmacy consulted to dose and monitor heparin drip in this 66 year old male for a PE. Patient was not taking anticoagulants prior to admission. Patient currently receiving heparin at 1900 units/hr.   Goal of Therapy:  Heparin level 0.3-0.7 units/ml Monitor platelets by anticoagulation protocol: Yes   Plan:  Anti-Xa level in range, will continue heparin 1900 units/hr. Will obtain confirmatory level in 6 hours.   Allergies  Allergen Reactions  . Toprol Xl [Metoprolol Tartrate] Other (See Comments)    Bradycardia    Patient Measurements: Height: 6\' 1"  (185.4 cm) Weight: 255 lb 15.3 oz (116.1 kg) IBW/kg (Calculated) : 79.9 Heparin Dosing Weight: 105 kg  Vital Signs: Temp: 96.8 F (36 C) (06/27 1218) Temp Source: Axillary (06/27 1218) BP: 86/68 (06/27 1300) Pulse Rate: 102 (06/27 1300)  Labs:  Recent Labs  05/07/2017 1136 05/05/2017 1853 05/02/2017 2227 05/04/17 0417 05/04/17 1158  HGB 12.9* 12.2*  --  11.9*  --   HCT 36.8* 35.2*  --  34.4*  --   PLT 198 171  --  136*  --   APTT  --  148*  --  76*  --   LABPROT 14.8 17.3*  --   --   --   INR 1.15 1.40  --   --   --   HEPARINUNFRC  --   --  0.47 0.21* 0.57  CREATININE 1.10 0.98  --  0.97  --   TROPONINI <0.03  --   --   --   --     Estimated Creatinine Clearance: 101.4 mL/min (by C-G formula based on SCr of 0.97 mg/dL).   Medical History: Past Medical History:  Diagnosis Date  . Anxiety   . Asthma   . Depression   . Gout   . Hypertension     Scheduled:  . acidophilus  1 capsule Oral BID  . allopurinol  300 mg Oral Daily  . amiodarone  400 mg Oral Q12H   Followed by  . [START ON 05/11/2017] amiodarone  400 mg Oral Daily  . amLODipine  10 mg Oral Daily  . budesonide (PULMICORT) nebulizer solution  0.5 mg Nebulization BID  . feeding supplement (ENSURE ENLIVE)  237 mL Oral BID BM  . ipratropium-albuterol  3 mL  Nebulization Q4H  . levothyroxine  100 mcg Oral Q0600  . mouth rinse  15 mL Mouth Rinse BID  . multivitamin with minerals  1 tablet Oral Daily  . vancomycin  250 mg Oral Q6H   Infusions:  . dexmedetomidine (PRECEDEX) IV infusion 0.4 mcg/kg/hr (05/04/17 1344)  . diltiazem (CARDIZEM) infusion 10 mg/hr (05/04/17 1344)  . heparin 1,900 Units/hr (05/04/17 0610)  . sodium chloride      Pharmacy will continue to monitor and adjust per consult.   Simpson,Michael L 05/04/2017,1:49 PM

## 2017-05-04 NOTE — Progress Notes (Signed)
Patient ID: Arthur Conner, male   DOB: 02-22-1951, 66 y.o.   MRN: [id number]  [employer] PROGRESS NOTE  Arthur Conner:[id number] DOB: 07-09-1951 DOA: 04/14/2017 PCP: Steele Sizer, MD  HPI/Subjective: Patient seen at 3 PM. He was made comfort care measures by critical care specialist. I spoke with the family at the bedside and I advised them to talk with him and say whenever you need to say. Patient at 3 PM was able to talk with me. He stated he was short of breath. No chest pain or abdominal pain.  Objective: Vitals:   05/04/17 1300 05/04/17 1400  BP: (!) 86/68 (!) 78/66  Pulse: (!) 102 96  Resp: (!) 30 (!) 28  Temp:      Filed Weights   04/17/2017 1555  Weight: 116.1 kg (255 lb 15.3 oz)    ROS: Review of Systems  Unable to perform ROS: Acuity of condition  Respiratory: Positive for shortness of breath.   Cardiovascular: Negative for chest pain.  Gastrointestinal: Negative for abdominal pain, nausea and vomiting.   Exam: Physical Exam  Constitutional: He appears lethargic.  HENT:  Nose: No mucosal edema.  Eyes: Conjunctivae, EOM and lids are normal. Pupils are equal, round, and reactive to light.  Neck: No JVD present. Carotid bruit is not present. No edema present. No thyroid mass and no thyromegaly present.  Cardiovascular: S1 normal and S2 normal.  Exam reveals no gallop.   No murmur heard. Pulses:      Dorsalis pedis pulses are 2+ on the right side, and 2+ on the left side.  Respiratory: Accessory muscle usage present. He is in respiratory distress. He has decreased breath sounds in the right middle field, the right lower field, the left middle field and the left lower field. He has wheezes in the right upper field and the left upper field. He has no rhonchi. He has rales in the right lower field and the left lower field.  GI: Soft. Bowel sounds are normal. There is no tenderness.  Musculoskeletal:       Right ankle: He exhibits swelling.       Left  ankle: He exhibits swelling.  Lymphadenopathy:    He has no cervical adenopathy.  Neurological: He appears lethargic.  Skin: Skin is warm. No rash noted. Nails show no clubbing.  Psychiatric:  Lethargic but able to answer questions      Data Reviewed: Basic Metabolic Panel:  Recent Labs Lab 04/27/2017 1136 05/02/2017 1853 05/04/17 0417  NA 135 138 140  K 3.1* 2.9* 2.8*  CL 106 110 112*  CO2 18* 19* 22  GLUCOSE 160* 90 101*  BUN 9 8 8   CREATININE 1.10 0.98 0.97  CALCIUM 8.4* 7.8* 7.8*  MG  --   --  1.8   Liver Function Tests:  Recent Labs Lab 05/02/2017 1136 04/14/2017 1853 05/04/17 0417  AST 29 27 22   ALT 38 32 28  ALKPHOS 51 41 41  BILITOT 1.3* 1.2 1.0  PROT 6.3* 5.5* 5.4*  ALBUMIN 3.5 3.0* 2.8*   CBC:  Recent Labs Lab 05/01/2017 1136 04/28/2017 1853 05/04/17 0417  WBC 12.1* 12.5* 9.2  NEUTROABS  --  10.3*  --   HGB 12.9* 12.2* 11.9*  HCT 36.8* 35.2* 34.4*  MCV 94.6 97.5 96.9  PLT 198 171 136*   Cardiac Enzymes:  Recent Labs Lab 04/15/2017 1136  TROPONINI <0.03   BNP (last 3 results)  Recent Labs  04/24/17 1737 04/13/2017 1136  BNP  27.0 434.0*    ProBNP (last 3 results) No results for input(s): PROBNP in the last 8760 hours.  CBG:  Recent Labs Lab 04/30/2017 1549  GLUCAP 114*    Recent Results (from the past 240 hour(s))  Blood Culture (routine x 2)     Status: None   Collection Time: 04/24/17  6:20 PM  Result Value Ref Range Status   Specimen Description BLOOD BLOOD LEFT FOREARM  Final   Special Requests   Final    BOTTLES DRAWN AEROBIC AND ANAEROBIC Blood Culture results may not be optimal due to an inadequate volume of blood received in culture bottles   Culture NO GROWTH 5 DAYS  Final   Report Status 04/29/2017 FINAL  Final  Blood Culture (routine x 2)     Status: None   Collection Time: 04/24/17  6:20 PM  Result Value Ref Range Status   Specimen Description BLOOD BLOOD LEFT HAND  Final   Special Requests   Final    BOTTLES DRAWN  AEROBIC AND ANAEROBIC Blood Culture adequate volume   Culture NO GROWTH 5 DAYS  Final   Report Status 04/29/2017 FINAL  Final  C difficile quick scan w PCR reflex     Status: Abnormal   Collection Time: 04/24/17 10:10 PM  Result Value Ref Range Status   C Diff antigen POSITIVE (A) NEGATIVE Final   C Diff toxin NEGATIVE NEGATIVE Final   C Diff interpretation Results are indeterminate. See PCR results.  Final  Clostridium Difficile by PCR     Status: Abnormal   Collection Time: 04/24/17 10:10 PM  Result Value Ref Range Status   Toxigenic C Difficile by pcr POSITIVE (A) NEGATIVE Final    Comment: Positive for toxigenic C. difficile with little to no toxin production. Only treat if clinical presentation suggests symptomatic illness.  Gastrointestinal Panel by PCR , Stool     Status: None   Collection Time: 04/24/17 10:17 PM  Result Value Ref Range Status   Campylobacter species NOT DETECTED NOT DETECTED Final   Plesimonas shigelloides NOT DETECTED NOT DETECTED Final   Salmonella species NOT DETECTED NOT DETECTED Final   Yersinia enterocolitica NOT DETECTED NOT DETECTED Final   Vibrio species NOT DETECTED NOT DETECTED Final   Vibrio cholerae NOT DETECTED NOT DETECTED Final   Enteroaggregative E coli (EAEC) NOT DETECTED NOT DETECTED Final   Enteropathogenic E coli (EPEC) NOT DETECTED NOT DETECTED Final   Enterotoxigenic E coli (ETEC) NOT DETECTED NOT DETECTED Final   Shiga like toxin producing E coli (STEC) NOT DETECTED NOT DETECTED Final   Shigella/Enteroinvasive E coli (EIEC) NOT DETECTED NOT DETECTED Final   Cryptosporidium NOT DETECTED NOT DETECTED Final   Cyclospora cayetanensis NOT DETECTED NOT DETECTED Final   Entamoeba histolytica NOT DETECTED NOT DETECTED Final   Giardia lamblia NOT DETECTED NOT DETECTED Final   Adenovirus F40/41 NOT DETECTED NOT DETECTED Final   Astrovirus NOT DETECTED NOT DETECTED Final   Norovirus GI/GII NOT DETECTED NOT DETECTED Final   Rotavirus A NOT  DETECTED NOT DETECTED Final   Sapovirus (I, II, IV, and V) NOT DETECTED NOT DETECTED Final  MRSA PCR Screening     Status: None   Collection Time: 04/25/17  1:13 AM  Result Value Ref Range Status   MRSA by PCR NEGATIVE NEGATIVE Final    Comment:        The GeneXpert MRSA Assay (FDA approved for NASAL specimens only), is one component of a comprehensive MRSA colonization surveillance program. It  is not intended to diagnose MRSA infection nor to guide or monitor treatment for MRSA infections.   Urine culture     Status: None   Collection Time: 04/25/17  5:05 AM  Result Value Ref Range Status   Specimen Description URINE, RANDOM  Final   Special Requests NONE  Final   Culture   Final    NO GROWTH Performed at [facility], [address], [place], [place] [address]    Report Status 04/26/2017 FINAL  Final  Culture, group A strep     Status: None   Collection Time: 04/25/17  5:05 AM  Result Value Ref Range Status   Specimen Description THROAT  Final   Special Requests NONE  Final   Culture   Final    NO GROUP A STREP (S.PYOGENES) ISOLATED Performed at [facility], [address] [place], [place] [address]    Report Status 04/27/2017 FINAL  Final  Blood Culture (routine x 2)     Status: None (Preliminary result)   Collection Time: 04/27/2017 11:45 AM  Result Value Ref Range Status   Specimen Description BLOOD RIGHT HAND  Final   Special Requests   Final    BOTTLES DRAWN AEROBIC AND ANAEROBIC Blood Culture adequate volume   Culture NO GROWTH < 24 HOURS  Final   Report Status PENDING  Incomplete  Blood Culture (routine x 2)     Status: None (Preliminary result)   Collection Time: 05/05/2017 11:52 AM  Result Value Ref Range Status   Specimen Description BLOOD RIGHT ARM   Final   Special Requests   Final    BOTTLES DRAWN AEROBIC AND ANAEROBIC Blood Culture adequate volume   Culture NO GROWTH < 24 HOURS  Final   Report Status PENDING  Incomplete  Urine culture      Status: Abnormal   Collection Time: 04/20/2017  1:26 PM  Result Value Ref Range Status   Specimen Description URINE, RANDOM  Final   Special Requests NONE  Final   Culture (A)  Final    <10,000 COLONIES/mL INSIGNIFICANT GROWTH Performed at [facility], [address] [place], [place] [address]    Report Status 05/04/2017 FINAL  Final  Culture, sputum-assessment     Status: None   Collection Time: 04/27/2017  4:50 PM  Result Value Ref Range Status   Specimen Description SPUTUM  Final   Special Requests NONE  Final   Sputum evaluation   Final    Sputum specimen not acceptable for testing.  Please recollect.   SPOKE TO [facility] WILSON 04/10/2017 @ 1819 MLK    Report Status 04/18/2017 FINAL  Final  MRSA PCR Screening     Status: None   Collection Time: 05/07/2017  4:50 PM  Result Value Ref Range Status   MRSA by PCR NEGATIVE NEGATIVE Final    Comment:        The GeneXpert MRSA Assay (FDA approved for NASAL specimens only), is one component of a comprehensive MRSA colonization surveillance program. It is not intended to diagnose MRSA infection nor to guide or monitor treatment for MRSA infections.   Culture, blood (x 2)     Status: None (Preliminary result)   Collection Time: 05/02/2017  6:58 PM  Result Value Ref Range Status   Specimen Description BLOOD BLOOD RIGHT HAND  Final   Special Requests   Final    BOTTLES DRAWN AEROBIC AND ANAEROBIC Blood Culture adequate volume   Culture NO GROWTH <  12 HOURS  Final   Report Status PENDING  Incomplete     Studies: Ct Angio Chest Pe W Or Wo Contrast  Result Date: May 19, 2017 CLINICAL DATA:  66 year old male with acute shortness of breath and cough. EXAM: CT ANGIOGRAPHY CHEST WITH CONTRAST TECHNIQUE: Multidetector CT imaging of the chest was performed using the standard protocol during bolus administration of intravenous contrast. Multiplanar CT image reconstructions and MIPs were obtained to evaluate the vascular anatomy. CONTRAST:  75  cc intravenous Isovue 370 COMPARISON:  04/24/2017 noncontrast CT.  05/19/17 radiographs. FINDINGS: Cardiovascular: This is a technically adequate study. Bilateral pulmonary emboli are identified involving the majority of the pulmonary segments. RV/LV ratio measures 1.1. Mild cardiomegaly noted. Mild thoracic aortic atherosclerotic calcifications noted without aneurysm. No pericardial effusion Mediastinum/Nodes: No enlarged mediastinal, hilar, or axillary lymph nodes. Thyroid gland, trachea, and esophagus demonstrate no significant findings. Lungs/Pleura: Scattered ground-glass, mild airspace opacities and atelectasis within both lungs noted, greatest in the right upper and left lower lobes. There is no evidence of pulmonary mass, pleural effusion or pneumothorax. Centrilobular emphysema is again identified. Upper Abdomen: No acute abnormality Musculoskeletal: No chest wall abnormality. No acute or significant osseous findings. Review of the MIP images confirms the above findings. IMPRESSION: Positive for acute PE with CT evidence of right heart strain (RV/LV Ratio = 1.1) consistent with at least submassive (intermediate risk) PE. The presence of right heart strain has been associated with an increased risk of morbidity and mortality. Please activate Code PE by paging [phone]. Scattered bilateral ground-glass, mild airspace opacities and atelectasis, likely related to changes from pulmonary emboli. Aortic Atherosclerosis (ICD10-I70.0) and Emphysema (ICD10-J43.9). Critical Value/emergent results were called by telephone at the time of interpretation on 2017-05-19 at 6:56 pm to [facility], who verbally acknowledged these results. Electronically Signed   By: Harmon Pier M.D.   On: May 19, 2017 19:00   US Venous Img Lower Bilateral  Result Date: 05/04/2017 CLINICAL DATA:  Calf pain. EXAM: BILATERAL LOWER EXTREMITY VENOUS DOPPLER ULTRASOUND TECHNIQUE: Gray-scale sonography with graded compression, as well as  color Doppler and duplex ultrasound were performed to evaluate the lower extremity deep venous systems from the level of the common femoral vein and including the common femoral, femoral, profunda femoral, popliteal and calf veins including the posterior tibial, peroneal and gastrocnemius veins when visible. The superficial great saphenous vein was also interrogated. Spectral Doppler was utilized to evaluate flow at rest and with distal augmentation maneuvers in the common femoral, femoral and popliteal veins. COMPARISON:  Left knee series 6 05/2017.  CT 05-19-17. FINDINGS: RIGHT LOWER EXTREMITY Common Femoral Vein: No evidence of thrombus. Normal compressibility, respiratory phasicity and response to augmentation. Saphenofemoral Junction: No evidence of thrombus. Normal compressibility and flow on color Doppler imaging. Profunda Femoral Vein: No evidence of thrombus. Normal compressibility and flow on color Doppler imaging. Femoral Vein: No evidence of thrombus. Normal compressibility, respiratory phasicity and response to augmentation. Popliteal Vein: No evidence of thrombus. Normal compressibility, respiratory phasicity and response to augmentation. Calf Veins: No evidence of thrombus. Normal compressibility and flow on color Doppler imaging. Superficial Great Saphenous Vein: No evidence of thrombus. Normal compressibility and flow on color Doppler imaging. Other Findings:  None. LEFT LOWER EXTREMITY Common Femoral Vein: No evidence of thrombus. Normal compressibility, respiratory phasicity and response to augmentation. Saphenofemoral Junction: No evidence of thrombus. Normal compressibility and flow on color Doppler imaging. Profunda Femoral Vein: No evidence of thrombus. Normal compressibility and flow on color Doppler imaging. Femoral Vein: No evidence of  thrombus. Normal compressibility, respiratory phasicity and response to augmentation. Popliteal Vein: No evidence of thrombus. Normal compressibility,  respiratory phasicity and response to augmentation. Calf Veins: No evidence of thrombus. Normal compressibility and flow on color Doppler imaging. Superficial Great Saphenous Vein: No evidence of thrombus. Normal compressibility and flow on color Doppler imaging. Other Findings:  None. IMPRESSION: No evidence of DVT within either lower extremity. Electronically Signed   By: Maisie Fus  Register   On: 05/04/2017 10:53   Dg Chest Port 1 View  Result Date: 05/04/2017 CLINICAL DATA:  Respiratory failure. EXAM: PORTABLE CHEST 1 VIEW COMPARISON:  CT 04/08/2017, 04/24/2017. Chest x-ray 04/17/2017, 04/24/2017. FINDINGS: Cardiomegaly. Persistent bilateral pulmonary infiltrates/ edema. Small bilateral pleural effusions. No pneumothorax. IMPRESSION: 1. Cardiomegaly. 2. Persistent diffuse bilateral pulmonary infiltrates/edema without significant interim change. Small bilateral pleural effusions. Electronically Signed   By: Maisie Fus  Register   On: 05/04/2017 06:38   Dg Chest Port 1 View  Result Date: 04/11/2017 CLINICAL DATA:  Chest pain and shortness of breath since a fall 2 weeks ago. EXAM: PORTABLE CHEST 1 VIEW COMPARISON:  CT scan and chest x-ray dated 04/24/2017 FINDINGS: There has been progression of patchy bilateral pulmonary infiltrates since the prior CT scan. Infiltrates more prominent on the left than the right. Heart size and pulmonary vascularity appear normal. Emphysema was present on the prior CT scan. IMPRESSION: Progressive bilateral pulmonary infiltrates, left greater than right. Emphysema. Electronically Signed   By: Francene Boyers M.D.   On: 04/21/2017 12:05    Scheduled Meds: Continuous Infusions: . dexmedetomidine (PRECEDEX) IV infusion Stopped (05/04/17 1526)  . morphine 2.5 mg/hr (05/04/17 1604)    Assessment/Plan:  1. Acute respiratory distress 2. Clinical sepsis Bilateral pneumonia 3. Pulmonary embolism 4. Atrial fibrillation with rapid ventricular response 5. Multiple  falls 6. Hypokalemia 7. Hypothyroidism 8. Essential hypertension 9. Anxiety depression  Patient made comfort care measures and morphine drip ordered. Patient made a DO NOT RESUSCITATE by critical care specialist. When I saw him he was on high flow nasal cannula also  Code Status:     Code Status Orders        Start     Ordered   05/04/17 1409  DNR (Do not attempt resuscitation)  Continuous    Question Answer Comment  In the event of cardiac or respiratory ARREST Do not call a "code blue"   In the event of cardiac or respiratory ARREST Do not perform Intubation, CPR, defibrillation or ACLS   In the event of cardiac or respiratory ARREST Use medication by any route, position, wound care, and other measures to relive pain and suffering. May use oxygen, suction and manual treatment of airway obstruction as needed for comfort.      05/04/17 1410    Code Status History    Date Active Date Inactive Code Status Order ID Comments User Context   05/04/2017 11:15 AM 05/04/2017  2:10 PM DNR [id number]  Erin Fulling, MD Inpatient   04/25/2017 12:50 AM 04/26/2017  4:33 PM Full Code [id number]  Oralia Manis, MD Inpatient   04/14/2017  7:28 PM 04/22/2017  4:43 PM Full Code [id number]  Ramonita Lab, MD Inpatient    Advance Directive Documentation     Most Recent Value  Type of Advance Directive  Healthcare Power of Attorney, Living will  Pre-existing out of facility DNR order (yellow form or pink MOST form)  -  "MOST" Form in Place?  -     Family Communication: Family at bedside Disposition Plan: Patient will  not survive much longer once on morphine drip  Consultants:  Critical care specialist  Cardiology  Time spent: 25 minutes  Alford Highland  [employer]

## 2017-05-04 NOTE — Progress Notes (Signed)
CH met with Pt daughter in hallway of ICU. Daughter gave a brief history of Pt and stated that it is difficult watching her father this way.Daughter stated that there were some major decisions to be made very soon. CH offered prayer and will follow up as needed.    05/04/17 1130  Clinical Encounter Type  Visited With Patient;Patient and family together  Visit Type Initial;Spiritual support  Referral From Nurse  Consult/Referral To Chaplain  Spiritual Encounters  Spiritual Needs Prayer;Emotional;Grief support

## 2017-05-04 NOTE — Progress Notes (Signed)
K+ level of 2.8 called to [place] NP.

## 2017-05-04 NOTE — Progress Notes (Signed)
After further discussion with family, patient has been progressively declining over the last several months with recurrent hospital admissions and ongoing tobacco abuse and ETOH abuse.  Patient respiratory  status declining rapidly, patient placed on biPAP.   I have explained to patient and family that he will need intubation and vent support and without this he will suffocate and struggle to breath.   Patient has DECLINED VENT SUPPORT and the patient expressed this explicitly to ICU nursing staff, myself and to his Daughter and Son who are at bedside.  I have explained to family that with their consent and agreement, patient will be made a DNR/DNI.  Will plan to continue BiPAP and start Precedex infusion and assess WOB and treat with morphine as needed.  Will try BiPAP and meds and re-assess respiratory status and this will be the most aggressive therapy that patient and family desires,and  if this fails and patient continues to decline, we will proceed to Comfort care measures.    Patient/Family are satisfied with Plan of action and management. All questions answered  Lucie LeatherKurian David Spike Desilets, M.D.  [facility]  Medical Director [facility] Medical Director [facility] Cardio[facility]

## 2017-05-04 NOTE — Progress Notes (Signed)
Heparin [facility] 0.47 [facility]. Stated leave at current rate and will check next level at 0500.

## 2017-05-04 NOTE — Progress Notes (Signed)
Chaplain received a page to visit with pt in room IC-8. Pt was on a respirator and was about to be taken off. Family was in tears and was taking the new very hard. Family requested prayer for healing and a blessing on the family. Chaplain provided the ministry of prayer as well as grief and emotional support.    05/04/17 1130  Clinical Encounter Type  Visited With Patient;Patient and family together  Visit Type Initial;Spiritual support  Referral From Nurse  Consult/Referral To Chaplain  Spiritual Encounters  Spiritual Needs Prayer;Emotional;Grief support

## 2017-05-04 NOTE — Progress Notes (Signed)
After further discussion with family and children, patient has suffered tremendously and is struggling to breathe and with suffocating, the family and children Have  consented and agreed to Comfort Care Measures     Family are satisfied with Plan of action and management. All questions answered  Lucie LeatherKurian David Arshi Duarte, M.D.  [facility]  Medical Director [facility] Medical Director [facility]

## 2017-05-04 NOTE — Progress Notes (Signed)
ANTICOAGULATION CONSULT NOTE - Initial Consult  Pharmacy Consult for heparin Indication: pulmonary embolus  Allergies  Allergen Reactions  . Toprol Xl [Metoprolol Tartrate] Other (See Comments)    Bradycardia    Patient Measurements: Height: 6\' 1"  (185.4 cm) Weight: 255 lb 15.3 oz (116.1 kg) IBW/kg (Calculated) : 79.9 Heparin Dosing Weight: 105 kg  Vital Signs: Temp: 98.5 F (36.9 C) (06/27 0400) Temp Source: Oral (06/27 0400) BP: 105/77 (06/27 0500) Pulse Rate: 112 (06/27 0546)  Labs:  Recent Labs  04/24/2017 1136 04/26/2017 1853 04/21/2017 2227 05/04/17 0417  HGB 12.9* 12.2*  --  11.9*  HCT 36.8* 35.2*  --  34.4*  PLT 198 171  --  136*  APTT  --  148*  --   --   LABPROT 14.8 17.3*  --   --   INR 1.15 1.40  --   --   HEPARINUNFRC  --   --  0.47 0.21*  CREATININE 1.10 0.98  --  0.97  TROPONINI <0.03  --   --   --     Estimated Creatinine Clearance: 101.4 mL/min (by C-G formula based on SCr of 0.97 mg/dL).   Medical History: Past Medical History:  Diagnosis Date  . Anxiety   . Asthma   . Depression   . Gout   . Hypertension     Medications:  Prescriptions Prior to Admission  Medication Sig Dispense Refill Last Dose  . albuterol (PROVENTIL) (2.5 MG/3ML) 0.083% nebulizer solution INHALE CONTENTS OF 1 VIAL BY MOUTH THROUGH NEBULIZER EVERY 4 TO 6 HOURS AS NEEDED FOR SHORTNESS OF BREATH. 300 mL 12 prn at prn  . allopurinol (ZYLOPRIM) 300 MG tablet TAKE 1 TABLET BY MOUTH ONCE DAILY 30 tablet 2 04/08/2017 at am  . amLODipine (NORVASC) 10 MG tablet TAKE 1 TABLET BY MOUTH ONCE DAILY 30 tablet 2 05/02/2017 at am  . budesonide-formoterol (SYMBICORT) 160-4.5 MCG/ACT inhaler Inhale 2 puffs into the lungs 2 (two) times daily.   04/11/2017 at am  . cetirizine (ZYRTEC) 10 MG tablet once daily.   04/20/2017 at am  . levothyroxine (SYNTHROID, LEVOTHROID) 100 MCG tablet TAKE 1 TABLET BY MOUTH ONCE DAILY ON AN EMPTY STOMACH. WAIT 30 MINUTES BEFORE TAKING OTHER MEDS. 30 tablet 1  04/19/2017 at am  . LORazepam (ATIVAN) 0.5 MG tablet Take 1 tablet (0.5 mg total) by mouth every 8 (eight) hours as needed (CIWA-AR > 8-OR-withdrawal symptoms:anxiety, agitation, insomnia, diaphoresis, nausea, vomiting, tremors, tachycardia, or hypertension.). 15 tablet 0 prn at prn  . Multiple Vitamin (MULTIVITAMIN WITH MINERALS) TABS tablet Take 1 tablet by mouth daily. 30 tablet 0 05/05/2017 at am  . PROVENTIL HFA 108 (90 Base) MCG/ACT inhaler INHALE 2 PUFFS BY MOUTH EVERY 6 HOURS AS NEEDED FOR WHEEZING OR SHORTNESS OF BREATH. 6.7 g 1 prn at prn  . valsartan (DIOVAN) 160 MG tablet Take 1 tablet (160 mg total) by mouth daily. 30 tablet 0 05/02/2017 at am  . vancomycin (VANCOCIN) 50 mg/mL oral solution Take 5 mLs (250 mg total) by mouth every 6 (six) hours. 160 mL 0 04/08/2017 at am  . feeding supplement, ENSURE ENLIVE, (ENSURE ENLIVE) LIQD Take 237 mLs by mouth 2 (two) times daily between meals. 237 mL 12    Scheduled:  . allopurinol  300 mg Oral Daily  . amiodarone  400 mg Oral Q12H   Followed by  . [START ON 05/11/2017] amiodarone  400 mg Oral Daily  . amLODipine  10 mg Oral Daily  . feeding supplement (ENSURE  ENLIVE)  237 mL Oral BID BM  . heparin  1,600 Units Intravenous Once  . levothyroxine  100 mcg Oral Q0600  . multivitamin with minerals  1 tablet Oral Daily  . potassium chloride  40 mEq Oral Once  . vancomycin  250 mg Oral Q6H   Infusions:  . ceFEPime (MAXIPIME) IV Stopped (05/04/17 0439)  . diltiazem (CARDIZEM) infusion 15 mg/hr (05/04/17 0441)  . heparin 1,700 Units/hr (05/04/17 0234)  . sodium chloride      Assessment: Pharmacy consulted to dose and monitor heparin drip in this 66 year old male for a PE. Patient was not taking anticoagulants prior to admission and baseline labs were obtained.   Goal of Therapy:  Heparin level 0.3-0.7 units/ml Monitor platelets by anticoagulation protocol: Yes   Plan:  Give 6300 units bolus x 1 Start heparin infusion at 1700  units/hr Check anti-Xa level in 6 hours and daily while on heparin Continue to monitor H&H and platelets  6/26 2230 heparin level 0.47. Continue current regimen. Recheck heparin level and CBC with tomorrow AM labs.  6/27 AM heparin level 0.21. 1600 unit bolus and increase rate to 1900 units/hr. Recheck in 6 hours.  Erich MontaneMcBane,Shontae Rosiles S, PharmD Clinical Pharmacist 05/04/2017,5:48 AM

## 2017-05-04 NOTE — Consult Note (Signed)
[facility] CLINIC CARDIOLOGY A [facility]  CARDIOLOGY CONSULT NOTE  Patient ID: Arthur Conner MRN: [id number] DOB/AGE: 03/24/1951 66 y.o.  Admit date: 04/25/2017 Referring Physician Dr. Renae Gloss Primary Physician   Primary Cardiologist  Reason for Consultation afib with rvr  HPI: Pt is a 66 yo male who was admitted with acute dyspnea,sepsis and afib with rvr which is apparently new. Pt is a difficult historian but denies ever having any heart problems. He has been admitted 2 times recently with sob. The most recent admission was 04/26/17. He has a history of hyponatremia, etoh abuse and withdrawal with history of frequent falls. He has been admitted for C. Difficile colitis as well and had a spontaneous ptx. CT of chest showed submassive bilateral pulmonary emboli. He was also noted to have afib with rvr.CXR revealed persistent diffuse bilateral pulmonary infiltrates/edema with small bilaterl pleural effusions. He is currently on heparin, abx and iv cardizem. Remains in afib with ventricular rate of 110-120.   Review of Systems  HENT: Negative.   Eyes: Negative.   Respiratory: Positive for cough and shortness of breath.   Cardiovascular: Positive for leg swelling.  Gastrointestinal: Negative.   Musculoskeletal: Negative.   Skin: Negative.   Neurological: Positive for weakness.  Endo/Heme/Allergies: Negative.   Psychiatric/Behavioral: Negative.     Past Medical History:  Diagnosis Date  . Anxiety   . Asthma   . Depression   . Gout   . Hypertension     Family History  Problem Relation Age of Onset  . Lung cancer Father   . Aneurysm Mother     Social History   Social History  . Marital status: Married    Spouse name: N/A  . Number of children: 2  . Years of education: N/A   Occupational History  . OWNER Self Employed   Social History Main Topics  . Smoking status: Current Every Day Smoker    Packs/day: 0.50    Types: Cigarettes    Last attempt  to quit: 08/11/1990  . Smokeless tobacco: Never Used  . Alcohol use 0.0 oz/week     Comment: "I'm not even sure how much I drink"  . Drug use: No  . Sexual activity: Not on file   Other Topics Concern  . Not on file   Social History Narrative  . No narrative on file    Past Surgical History:  Procedure Laterality Date  . APPENDECTOMY    . TONSILLECTOMY       Prescriptions Prior to Admission  Medication Sig Dispense Refill Last Dose  . albuterol (PROVENTIL) (2.5 MG/3ML) 0.083% nebulizer solution INHALE CONTENTS OF 1 VIAL BY MOUTH THROUGH NEBULIZER EVERY 4 TO 6 HOURS AS NEEDED FOR SHORTNESS OF BREATH. 300 mL 12 prn at prn  . allopurinol (ZYLOPRIM) 300 MG tablet TAKE 1 TABLET BY MOUTH ONCE DAILY 30 tablet 2 04/14/2017 at am  . amLODipine (NORVASC) 10 MG tablet TAKE 1 TABLET BY MOUTH ONCE DAILY 30 tablet 2 05/02/2017 at am  . budesonide-formoterol (SYMBICORT) 160-4.5 MCG/ACT inhaler Inhale 2 puffs into the lungs 2 (two) times daily.   05/04/2017 at am  . cetirizine (ZYRTEC) 10 MG tablet once daily.   04/23/2017 at am  . levothyroxine (SYNTHROID, LEVOTHROID) 100 MCG tablet TAKE 1 TABLET BY MOUTH ONCE DAILY ON AN EMPTY STOMACH. WAIT 30 MINUTES BEFORE TAKING OTHER MEDS. 30 tablet 1 05/05/2017 at am  . LORazepam (ATIVAN) 0.5 MG tablet Take 1 tablet (0.5 mg total) by  mouth every 8 (eight) hours as needed (CIWA-AR > 8-OR-withdrawal symptoms:anxiety, agitation, insomnia, diaphoresis, nausea, vomiting, tremors, tachycardia, or hypertension.). 15 tablet 0 prn at prn  . Multiple Vitamin (MULTIVITAMIN WITH MINERALS) TABS tablet Take 1 tablet by mouth daily. 30 tablet 0 04/19/2017 at am  . PROVENTIL HFA 108 (90 Base) MCG/ACT inhaler INHALE 2 PUFFS BY MOUTH EVERY 6 HOURS AS NEEDED FOR WHEEZING OR SHORTNESS OF BREATH. 6.7 g 1 prn at prn  . valsartan (DIOVAN) 160 MG tablet Take 1 tablet (160 mg total) by mouth daily. 30 tablet 0 05/02/2017 at am  . vancomycin (VANCOCIN) 50 mg/mL oral solution Take 5 mLs  (250 mg total) by mouth every 6 (six) hours. 160 mL 0 04/29/2017 at am  . feeding supplement, ENSURE ENLIVE, (ENSURE ENLIVE) LIQD Take 237 mLs by mouth 2 (two) times daily between meals. 237 mL 12     Physical Exam: Blood pressure (!) 126/95, pulse (!) 123, temperature (!) 96.8 F (36 C), temperature source Axillary, resp. rate (!) 32, height 6\' 1"  (1.854 m), weight 116.1 kg (255 lb 15.3 oz), SpO2 97 %.   Wt Readings from Last 1 Encounters:  04/15/2017 116.1 kg (255 lb 15.3 oz)     General appearance: cooperative and slowed mentation Resp: diminished breath sounds bilaterally, rhonchi bilaterally and wheezes bilaterally Chest wall: right sided chest wall tenderness, left sided chest wall tenderness Cardio: irregularly irregular rhythm GI: soft, non-tender; bowel sounds normal; no masses,  no organomegaly Extremities: edema bilateal edema in le Neurologic: Grossly normal  Labs:   Lab Results  Component Value Date   WBC 9.2 05/04/2017   HGB 11.9 (L) 05/04/2017   HCT 34.4 (L) 05/04/2017   MCV 96.9 05/04/2017   PLT 136 (L) 05/04/2017    Recent Labs Lab 05/04/17 0417  NA 140  K 2.8*  CL 112*  CO2 22  BUN 8  CREATININE 0.97  CALCIUM 7.8*  PROT 5.4*  BILITOT 1.0  ALKPHOS 41  ALT 28  AST 22  GLUCOSE 101*   Lab Results  Component Value Date   TROPONINI <0.03 05/01/2017       EKG: afib with rvr  ASSESSMENT AND PLAN:  66 yo with multiple medical problems including etoh abuse and withdrawal, recent admissions with c difficile, spontaneous ptx and now admitted with bilateral pe. Noted to have afib with rvr. Is on cardizem and heparin. Will continue to attempt to control rate with cardizem. Etiology of afib is likely multifactoral including increase rv pressure due to pe as well as history of etoh and ptx in the past. Would not try to convert. Rate control with cardizem as you are doing but will be dificult to get great rate control until pe resolves. Will need to consider long  term anticoagulation but given patients past history of frequent falls, etoh abuse, compliance will be difficult. Warfarin may be the best choice long term as levels can be followed.  Signed: Dalia HeadingKenneth A Floyed Masoud MD, [facility] Point[facility]ACC 05/04/2017, 9:09 AM

## 2017-05-05 MED ORDER — SCOPOLAMINE 1 MG/3DAYS TD PT72
1.0000 | MEDICATED_PATCH | TRANSDERMAL | Status: DC
  Administered 2017-05-05: 1.5 mg via TRANSDERMAL
  Filled 2017-05-05: qty 1

## 2017-05-05 NOTE — Clinical Social Work Note (Signed)
CSW consulted to inform that patient has now been made comfort care as of this morning. York SpanielMonica Jaeceon Michelin MSW,LCSW [phone]

## 2017-05-05 NOTE — Progress Notes (Signed)
Patient resting in bed. Family at bedside. Patient able to nod that he is not in pain at present time. Left lower extremity and right upper extremity feel cooler to the touch than his RLE and LUE. Pulses palpable. Patient's color appears more ashen in the face. Family aware of changes. Urine output amber. Heart rate in the 130s-140s, O2 sats in the mid 80s.

## 2017-05-05 NOTE — Progress Notes (Signed)
Patient will arouse to verbal and touching his shoulder stimulation at times. At that time he is able to communicate via shaking his head yes or no whether he is in pain and wants pain medication or ativan. Family has been present to see him answer these questions. All questions have been answered that the family has asked me. Much education has been provided to help prepare them. Patient's urine output has decreased significantly. Mouth care completed, but taking no fluids PO. Morphine drip going at 7mg /hr. Repositioned patient from right side to left side in bed as dependant edema was becoming more noticeable on the right side of his face. O2 remains on at 3L, while his oxygen level continues to decrease. Respirations remain labored and more shallow, but patient appears peaceful. Left lower leg feels slightly cooler than the right leg. All pulses still palpable. Temp of 98.9 axillary noted. No BM noted as of yet. This RN has been sure to check frequently to be sure he is clean. Family remains at bedside and have no plans to leave for the night and have requested to not have him moved. Chaplain has been offered to them for support and they declined.

## 2017-05-05 NOTE — Progress Notes (Signed)
CH visited with patient and family, on rounds. Patient was not awake. Family shared physicians sharing poor prognosis and family still believing in a miracle from God. Requested prayer, which CH offered along with spiritual presence and pastoral care.     05/05/17 1500  Clinical Encounter Type  Visited With Patient and family together  Visit Type Initial;Spiritual support  Referral From Chaplain  Consult/Referral To Chaplain  Spiritual Encounters  Spiritual Needs Prayer;Emotional

## 2017-05-05 NOTE — Progress Notes (Signed)
Per Dr mody increase morphine gtt maximum per pharmacy.  RPt consulted by writer, drip max now 20 mg/hr.  Currently infusing at 12 mg/hr.  O2 [place] increased to 4 l/min.  Pt appears more comfortable.  RR slowed to teens.  Breathing is agonal however.

## 2017-05-05 NOTE — Progress Notes (Signed)
   05/05/17 2200  Clinical Encounter Type  Visited With Family;Patient not available  Visit Type Follow-up;Spiritual support;Critical Care;Patient actively dying;Psychological support  Referral From Nurse  Spiritual Encounters  Spiritual Needs Emotional;Grief support  [place] paged to check with family; Patient met earlier with [facility] and patient on comfort care; [place] met family and indicated that [facility] is available throughout the night as needed.  [place] will follow-up in AM. Gwynn Burly 10:20 PM

## 2017-05-05 NOTE — Progress Notes (Signed)
[facility] [place] [facility]   PATIENT NAME: Arthur Conner    MR#:  [id number]  DATE OF BIRTH:  Nov 24, 1950  SUBJECTIVE:   Patient's family does not want patient to be moved. They had a very bad experience on the first floor in the past. Patient is resting comfortably  Family is going to anger stage REVIEW OF SYSTEMS:    Unable to obtain    Tolerating Diet: npo      DRUG ALLERGIES:   Allergies  Allergen Reactions  . Toprol Xl [Metoprolol Tartrate] Other (See Comments)    Bradycardia    VITALS:  Blood pressure (!) 78/66, pulse (!) 104, temperature (!) 101.3 F (38.5 C), temperature source Axillary, resp. rate 15, height 6\' 1"  (1.854 m), weight 116.1 kg (255 lb 15.3 oz), SpO2 (!) 88 %.  PHYSICAL EXAMINATION:  Constitutional: Lethargic Patient appears comfortable not in any pain     LABORATORY PANEL:   CBC  Recent Labs Lab 05/04/17 0417  WBC 9.2  HGB 11.9*  HCT 34.4*  PLT 136*   ------------------------------------------------------------------------------------------------------------------  Chemistries   Recent Labs Lab 05/04/17 0417  NA 140  K 2.8*  CL 112*  CO2 22  GLUCOSE 101*  BUN 8  CREATININE 0.97  CALCIUM 7.8*  MG 1.8  AST 22  ALT 28  ALKPHOS 41  BILITOT 1.0   ------------------------------------------------------------------------------------------------------------------  Cardiac Enzymes  Recent Labs Lab 05-11-17 1136  TROPONINI <0.03   ------------------------------------------------------------------------------------------------------------------  RADIOLOGY:  Ct Angio Chest Pe W Or Wo Contrast  Result Date: 05-11-2017 CLINICAL DATA:  66 year old male with acute shortness of breath and cough. EXAM: CT ANGIOGRAPHY CHEST WITH CONTRAST TECHNIQUE: Multidetector CT imaging of the chest was performed using the standard protocol during bolus administration of intravenous contrast. Multiplanar CT image  reconstructions and MIPs were obtained to evaluate the vascular anatomy. CONTRAST:  75 cc intravenous Isovue 370 COMPARISON:  04/24/2017 noncontrast CT.  05-11-2017 radiographs. FINDINGS: Cardiovascular: This is a technically adequate study. Bilateral pulmonary emboli are identified involving the majority of the pulmonary segments. RV/LV ratio measures 1.1. Mild cardiomegaly noted. Mild thoracic aortic atherosclerotic calcifications noted without aneurysm. No pericardial effusion Mediastinum/Nodes: No enlarged mediastinal, hilar, or axillary lymph nodes. Thyroid gland, trachea, and esophagus demonstrate no significant findings. Lungs/Pleura: Scattered ground-glass, mild airspace opacities and atelectasis within both lungs noted, greatest in the right upper and left lower lobes. There is no evidence of pulmonary mass, pleural effusion or pneumothorax. Centrilobular emphysema is again identified. Upper Abdomen: No acute abnormality Musculoskeletal: No chest wall abnormality. No acute or significant osseous findings. Review of the MIP images confirms the above findings. IMPRESSION: Positive for acute PE with CT evidence of right heart strain (RV/LV Ratio = 1.1) consistent with at least submassive (intermediate risk) PE. The presence of right heart strain has been associated with an increased risk of morbidity and mortality. Please activate Code PE by paging [phone]. Scattered bilateral ground-glass, mild airspace opacities and atelectasis, likely related to changes from pulmonary emboli. Aortic Atherosclerosis (ICD10-I70.0) and Emphysema (ICD10-J43.9). Critical Value/emergent results were called by telephone at the time of interpretation on 05-11-2017 at 6:56 pm to [facility], who verbally acknowledged these results. Electronically Signed   By: Harmon Pier M.D.   On: 2017-05-11 19:00   US Venous Img Lower Bilateral  Result Date: 05/04/2017 CLINICAL DATA:  Calf pain. EXAM: BILATERAL LOWER EXTREMITY VENOUS  DOPPLER ULTRASOUND TECHNIQUE: Gray-scale sonography with graded compression, as well as color Doppler and duplex ultrasound were performed  to evaluate the lower extremity deep venous systems from the level of the common femoral vein and including the common femoral, femoral, profunda femoral, popliteal and calf veins including the posterior tibial, peroneal and gastrocnemius veins when visible. The superficial great saphenous vein was also interrogated. Spectral Doppler was utilized to evaluate flow at rest and with distal augmentation maneuvers in the common femoral, femoral and popliteal veins. COMPARISON:  Left knee series 6 05/2017.  CT 05/07/2017. FINDINGS: RIGHT LOWER EXTREMITY Common Femoral Vein: No evidence of thrombus. Normal compressibility, respiratory phasicity and response to augmentation. Saphenofemoral Junction: No evidence of thrombus. Normal compressibility and flow on color Doppler imaging. Profunda Femoral Vein: No evidence of thrombus. Normal compressibility and flow on color Doppler imaging. Femoral Vein: No evidence of thrombus. Normal compressibility, respiratory phasicity and response to augmentation. Popliteal Vein: No evidence of thrombus. Normal compressibility, respiratory phasicity and response to augmentation. Calf Veins: No evidence of thrombus. Normal compressibility and flow on color Doppler imaging. Superficial Great Saphenous Vein: No evidence of thrombus. Normal compressibility and flow on color Doppler imaging. Other Findings:  None. LEFT LOWER EXTREMITY Common Femoral Vein: No evidence of thrombus. Normal compressibility, respiratory phasicity and response to augmentation. Saphenofemoral Junction: No evidence of thrombus. Normal compressibility and flow on color Doppler imaging. Profunda Femoral Vein: No evidence of thrombus. Normal compressibility and flow on color Doppler imaging. Femoral Vein: No evidence of thrombus. Normal compressibility, respiratory phasicity and  response to augmentation. Popliteal Vein: No evidence of thrombus. Normal compressibility, respiratory phasicity and response to augmentation. Calf Veins: No evidence of thrombus. Normal compressibility and flow on color Doppler imaging. Superficial Great Saphenous Vein: No evidence of thrombus. Normal compressibility and flow on color Doppler imaging. Other Findings:  None. IMPRESSION: No evidence of DVT within either lower extremity. Electronically Signed   By: Maisie Fus  Register   On: 05/04/2017 10:53   Dg Chest Port 1 View  Result Date: 05/04/2017 CLINICAL DATA:  Respiratory failure. EXAM: PORTABLE CHEST 1 VIEW COMPARISON:  CT 04/19/2017, 04/24/2017. Chest x-ray 04/22/2017, 04/24/2017. FINDINGS: Cardiomegaly. Persistent bilateral pulmonary infiltrates/ edema. Small bilateral pleural effusions. No pneumothorax. IMPRESSION: 1. Cardiomegaly. 2. Persistent diffuse bilateral pulmonary infiltrates/edema without significant interim change. Small bilateral pleural effusions. Electronically Signed   By: Maisie Fus  Register   On: 05/04/2017 06:38   Dg Chest Port 1 View  Result Date: 04/30/2017 CLINICAL DATA:  Chest pain and shortness of breath since a fall 2 weeks ago. EXAM: PORTABLE CHEST 1 VIEW COMPARISON:  CT scan and chest x-ray dated 04/24/2017 FINDINGS: There has been progression of patchy bilateral pulmonary infiltrates since the prior CT scan. Infiltrates more prominent on the left than the right. Heart size and pulmonary vascularity appear normal. Emphysema was present on the prior CT scan. IMPRESSION: Progressive bilateral pulmonary infiltrates, left greater than right. Emphysema. Electronically Signed   By: Francene Boyers M.D.   On: 04/28/2017 12:05     ASSESSMENT AND PLAN:    66 year old male with history of hypertension and chronic atrial fibrillation who was admitted due to sepsis from healthcare associated pneumonia   1. Acute hypoxic respiratory failure 2. Sepsis 3. Healthcare associated  pneumonia 4. Atrial fibrillation with RVR 5. Hypokalemia  Patient is on comfort care measures. Morphine drip has been ordered. Patient is DO NOT RESUSCITATE.  Management plans discussed with the patient 's family and charge nurse on ICU  Mostly fine time today was spent talking to the family. They're going through grieving and anger phase. CODE STATUS: DNR  TOTAL TIME TAKING CARE OF THIS PATIENT: 30 minutes.     POSSIBLE D/C ?, DEPENDING ON CLINICAL CONDITION.   Ciin Brazzel M.D on 05/05/2017 at 7:20 AM  Between 7am to 6pm - Pager - [phone] After 6pm go to [email or web address] - [email or web address]  [facility]  Office  [phone]  CC: Primary care physician; Steele Sizerrissman, Mark A, MD  Note: This dictation was prepared with Dragon dictation along with smaller phrase technology. Any transcriptional errors that result from this process are unintentional.

## 2017-05-05 NOTE — Progress Notes (Signed)
Family states they are comfortable.  Writer continues to assess needs of family and patient.  They prefer to not disturb patient any more than necessart.  Patient's sister arrived to unit with papers from an accountant that required signatures from 2 physicians stating patient cannot make decisions at this time.  Dr Belia HemanKasa and Dr Juliene PinaMody notified, have also called for a notary.  Morphine boluses as needed

## 2017-05-05 NOTE — Progress Notes (Signed)
Patient remains comfort care. Resting in bed with family at his bedside. Currently on morphine drip and PRN ativan. On 3L of O2 via [place] for comfort. Patient having obvious discomfort with breathing. Speaking is uncomfortable for patient. Currently receiving 6mg /hr of morphine. Able to make needs known for Ativan/morphine increase. Very agitated/anxious at times.  Labored breathing. Family at bedside. Currently appears comfortable.

## 2017-05-08 LAB — CULTURE, BLOOD (ROUTINE X 2)
CULTURE: NO GROWTH
Culture: NO GROWTH
Culture: NO GROWTH
SPECIAL REQUESTS: ADEQUATE
Special Requests: ADEQUATE
Special Requests: ADEQUATE

## 2017-05-08 NOTE — Death Summary Note (Signed)
DEATH SUMMARY   Patient Details  Name: Arthur Conner MRN: [id number] DOB: Feb 28, 1951  Admission/Discharge Information   Admit Date:  2017-05-10  Date of Death: Date of Death: 05/13/2017  Time of Death: Time of Death: 0112  Length of Stay: 3  Referring Physician: Steele Sizer, MD   Reason(s) for Hospitalization  Bilateral PE with AfibRVR  Diagnoses  Preliminary cause of death:  Secondary Diagnoses (including complications and co-morbidities):  Active Problems:   Sepsis (HCC)   DNR (do not resuscitate) discussion   DNR (do not resuscitate)   Palliative care status   Brief Hospital Course (including significant findings, care, treatment, and services provided and events leading to death)  Arthur Conner was a 66 y.o. year old male who had known history of alcohol abuse,Anxiety, Asthma,Gout,Hyopertension .  Patient had multiple admission in the past month and was discharged to a rehab SNF on 6/19.  Patient presented to ED in less than 24 hours with acute dyspnea and Afib with RVR.  Patient was found to bilateral PE.  Therefore was started on heparin gtt and diltiazem infusion to get rate controlled for atrial fibrillation. Patient was progressively declining and his respiratory status continued to decline.  Patient declined ventilatory support.  Upon further discussion with the family,family decided to change the goals of care and decided to withdraw care and make him  As comfortable as possible.  Therefore was decided to proceed with comfort care measures only.  Patient passed away on 01:12am on 2023/05/14.     Pertinent Labs and Studies  Significant Diagnostic Studies Ct Abdomen Pelvis Wo Contrast  Result Date: 04/24/2017 CLINICAL DATA:  Hypertension.  Abdominal cramping. EXAM: CT CHEST, ABDOMEN AND PELVIS WITHOUT CONTRAST TECHNIQUE: Multidetector CT imaging of the chest, abdomen and pelvis was performed following the standard protocol without IV contrast. COMPARISON:  Chest  radiograph 04/24/2017 FINDINGS: CT CHEST FINDINGS Cardiovascular: Normal caliber of the great vessels. Minimal calcific atherosclerotic disease of the aorta. Normal heart size. No pericardial effusion. Mediastinum/Nodes: No enlarged mediastinal, hilar, or axillary lymph nodes. Thyroid gland, trachea, and esophagus demonstrate no significant findings. Lungs/Pleura: Upper lobe predominant emphysema. Peripheral linear peribronchial opacities and mild bronchiectasis, likely represent chronic interstitial lung disease patchy ground-glass opacities also and peripheral predominance are present in both lungs. Right middle lobe volume loss . Musculoskeletal: Multilevel osteoarthritic changes. CT ABDOMEN PELVIS FINDINGS Hepatobiliary: Subcapsular area of hypoattenuation adjacent to the intersegmental fissure in the left lobe of the liver, likely represents focal fatty infiltration. No gallstones, gallbladder wall thickening, or biliary dilatation. Pancreas: Unremarkable. No pancreatic ductal dilatation or surrounding inflammatory changes. Spleen: Normal in size without focal abnormality. Adrenals/Urinary Tract: Adrenal glands are unremarkable. Kidneys are normal, without renal calculi, focal lesion, or hydronephrosis. Bladder is unremarkable. Stomach/Bowel: Stomach is within normal limits. No evidence of bowel wall thickening, distention, or inflammatory changes. Gas fluid levels within the transverse and descending colon, likely due to diarrheal state. Vascular/Lymphatic: Minimal aortic atherosclerosis. No enlarged abdominal or pelvic lymph nodes. Reproductive: Heterogeneous appearance of the prostate gland. Other: No abdominal wall hernia or abnormality. No abdominopelvic ascites. Musculoskeletal: Multilevel osteoarthritic changes of the spine. Bilateral L5 pars articularis defects. Minimal posterior listhesis of L4 on L5. IMPRESSION: Upper lobe predominant emphysema. Chronic interstitial lung disease with bronchiectasis,  fibrotic changes and volume loss. Superimposed ground-glass opacities in predominantly peripheral distribution may represent an element of acute infectious/ inflammatory changes on the background of chronic interstitial lung disease. Minimal calcific atherosclerotic disease of the aorta. No acute  abnormalities within the abdomen. Heterogeneous appearance of the prostate gland. Please correlate to patient's PSA values. Electronically Signed   By: Ted Mcalpine M.D.   On: 04/24/2017 19:55   Dg Eye Foreign Body  Result Date: 04/15/2017 CLINICAL DATA:  Metal exposure.  Clearance prior to MRI EXAM: ORBITS FOR FOREIGN BODY - 2 VIEW COMPARISON:  None. FINDINGS: There is no evidence of metallic foreign body within the orbits. No significant bone abnormality identified. IMPRESSION: No evidence of metallic foreign body within the orbits. Electronically Signed   By: Tollie Eth M.D.   On: 04/15/2017 18:30   Dg Chest 1 View  Result Date: 04/24/2017 CLINICAL DATA:  Acute shortness of breath. EXAM: CHEST 1 VIEW COMPARISON:  April 21, 2017 FINDINGS: Focal infiltrate in the left mid lung and perhaps the left base. Increasing haziness over the lateral right lung base with tube removal in the interval. The cardiomediastinal silhouette is stable. No other changes. IMPRESSION: 1. Developing infiltrate in the left mid and lower lung. Recommend follow-up to resolution. 2. Possible developing effusion on the right. Electronically Signed   By: Gerome Sam III M.D   On: 04/24/2017 19:14   Dg Chest 1 View  Result Date: 04/21/2017 CLINICAL DATA:  66 year old male with shortness of breath. Altered mental status. Hyponatremia. Recent right pneumothorax, treated with small caliber chest tube placement. EXAM: CHEST 1 VIEW COMPARISON:  0528 hours today, and earlier. FINDINGS: Portable AP upright view at at 1107 hours. Stable right lateral approach chest tube. No pneumothorax. Stable lung volumes and mediastinal contours.  Visualized tracheal air column is within normal limits. No pneumothorax. No consolidation identified. No definite pleural effusion. Improved right lung ventilation since 04/18/2017. Mild bilateral increased interstitial markings which are chronic to a degree. No overt pulmonary edema. No areas of worsening ventilation. Chronic left clavicle deformity. IMPRESSION: 1. Stable right chest tube.  No pneumothorax. 2. Improved right lung ventilation since 04/18/2017. No new cardiopulmonary abnormality. Electronically Signed   By: Odessa Fleming M.D.   On: 04/21/2017 11:24   Dg Chest 1 View  Result Date: 04/21/2017 CLINICAL DATA:  Dyspnea. EXAM: CHEST 1 VIEW COMPARISON:  04/20/2017 FINDINGS: The patient is slightly rotated to the left compared to yesterday's examination. A right-sided chest tube remains in place though has a slightly different configuration than on the prior study. The cardiomediastinal silhouette is unchanged. Bilateral mid and lower lung interstitial type opacities have mildly increased. There are small right and possibly small left pleural effusions. No pneumothorax is identified. A remote nonunited left clavicle fracture is noted. IMPRESSION: 1. Mildly increased bilateral lung opacities which may reflect mild interstitial edema. Small right pleural effusion. 2. No pneumothorax. Electronically Signed   By: Sebastian Ache M.D.   On: 04/21/2017 07:31   Dg Chest 1 View  Result Date: 04/20/2017 CLINICAL DATA:  Dyspnea EXAM: CHEST 1 VIEW COMPARISON:  Chest x-rays dated 04/19/2017, 04/18/2017 and 04/17/2017. FINDINGS: Right-sided chest tube in place, grossly stable in position. No residual pneumothorax seen on today's exam. Continued improvement in aeration bilaterally. No new lung abnormality. Heart size and mediastinal contours appear stable. IMPRESSION: 1. Improved aeration bilaterally, presumably decreased edema and/or airspace collapse. 2. Right-sided chest tube stable in position. No residual pneumothorax  seen on today's exam. Electronically Signed   By: Bary Richard M.D.   On: 04/20/2017 07:03   Dg Chest 1 View  Result Date: 04/19/2017 CLINICAL DATA:  66 year old male with dyspnea. Subsequent encounter. EXAM: CHEST 1 VIEW COMPARISON:  04/18/2017. FINDINGS:  Right-sided chest tube remains in place. Configuration appears different than on prior exam and therefore may have shifted position. Slight decrease in size of right apical pneumothorax now approximately 5-10%. Wedged shaped consolidation right mid lung stable. Pulmonary vascular congestion/ pulmonary edema. Cardiomegaly. Calcified slightly tortuous aorta. Remote left clavicle fracture. IMPRESSION: Change in configuration of right chest tube. Slight decrease in size of right apical pneumothorax now approximately 5-10%. Consolidation right mid lung stable. Pulmonary vascular congestion/ mild pulmonary edema stable. Cardiomegaly. Aortic atherosclerosis. Electronically Signed   By: Lacy Duverney M.D.   On: 04/19/2017 06:57   Dg Chest 1 View  Result Date: 04/18/2017 CLINICAL DATA:  66 year old male with alcohol withdrawal. Multiple fall. EXAM: CHEST 1 VIEW COMPARISON:  04/18/2017. FINDINGS: Interval placement of right-sided chest tube. There has been decrease in volume of the right-sided pneumothorax which measures approximately 1.2 cm in thickness over the apex. The heart size appears normal. Bilateral airspace opacities are unchanged from previous exam. Chronic appearing right posterior rib fracture deformity noted. IMPRESSION: 1. Decrease in volume of right-sided pneumothorax status post chest tube placement. 2. No change in bilateral airspace opacities. Electronically Signed   By: Signa Kell M.D.   On: 04/18/2017 08:43   Ct Head Wo Contrast  Result Date: 04/14/2017 CLINICAL DATA:  Several recent falls.  Altered mental status EXAM: CT HEAD WITHOUT CONTRAST TECHNIQUE: Contiguous axial images were obtained from the base of the skull through the vertex  without intravenous contrast. COMPARISON:  None. FINDINGS: Brain: The ventricles are normal in size and configuration. Sulci show age related volume loss. There is no intracranial mass, hemorrhage, extra-axial fluid collection, or midline shift. Gray-white compartments appear unremarkable. No evident acute infarct. Vascular: No hyperdense vessel. There is no appreciable vascular calcification. Skull: The bony calvarium appears intact. Sinuses/Orbits: There is slight mucosal thickening in several ethmoid air cells. There is leftward deviation of the nasal septum. There is a prominent concha bullosa on the right, an anatomic variant. Orbits appear symmetric bilaterally. Other: Visualized mastoid air cells are clear. IMPRESSION: No intracranial mass, hemorrhage, or extra-axial fluid collection. The gray-white compartments appear within normal limits. There is mild ethmoid sinus disease. There is leftward deviation of the nasal septum. Electronically Signed   By: Bretta Bang III M.D.   On: 04/14/2017 15:29   Ct Chest Wo Contrast  Result Date: 04/24/2017 CLINICAL DATA:  Hypertension.  Abdominal cramping. EXAM: CT CHEST, ABDOMEN AND PELVIS WITHOUT CONTRAST TECHNIQUE: Multidetector CT imaging of the chest, abdomen and pelvis was performed following the standard protocol without IV contrast. COMPARISON:  Chest radiograph 04/24/2017 FINDINGS: CT CHEST FINDINGS Cardiovascular: Normal caliber of the great vessels. Minimal calcific atherosclerotic disease of the aorta. Normal heart size. No pericardial effusion. Mediastinum/Nodes: No enlarged mediastinal, hilar, or axillary lymph nodes. Thyroid gland, trachea, and esophagus demonstrate no significant findings. Lungs/Pleura: Upper lobe predominant emphysema. Peripheral linear peribronchial opacities and mild bronchiectasis, likely represent chronic interstitial lung disease patchy ground-glass opacities also and peripheral predominance are present in both lungs. Right  middle lobe volume loss . Musculoskeletal: Multilevel osteoarthritic changes. CT ABDOMEN PELVIS FINDINGS Hepatobiliary: Subcapsular area of hypoattenuation adjacent to the intersegmental fissure in the left lobe of the liver, likely represents focal fatty infiltration. No gallstones, gallbladder wall thickening, or biliary dilatation. Pancreas: Unremarkable. No pancreatic ductal dilatation or surrounding inflammatory changes. Spleen: Normal in size without focal abnormality. Adrenals/Urinary Tract: Adrenal glands are unremarkable. Kidneys are normal, without renal calculi, focal lesion, or hydronephrosis. Bladder is unremarkable. Stomach/Bowel: Stomach is within  normal limits. No evidence of bowel wall thickening, distention, or inflammatory changes. Gas fluid levels within the transverse and descending colon, likely due to diarrheal state. Vascular/Lymphatic: Minimal aortic atherosclerosis. No enlarged abdominal or pelvic lymph nodes. Reproductive: Heterogeneous appearance of the prostate gland. Other: No abdominal wall hernia or abnormality. No abdominopelvic ascites. Musculoskeletal: Multilevel osteoarthritic changes of the spine. Bilateral L5 pars articularis defects. Minimal posterior listhesis of L4 on L5. IMPRESSION: Upper lobe predominant emphysema. Chronic interstitial lung disease with bronchiectasis, fibrotic changes and volume loss. Superimposed ground-glass opacities in predominantly peripheral distribution may represent an element of acute infectious/ inflammatory changes on the background of chronic interstitial lung disease. Minimal calcific atherosclerotic disease of the aorta. No acute abnormalities within the abdomen. Heterogeneous appearance of the prostate gland. Please correlate to patient's PSA values. Electronically Signed   By: Ted Mcalpine M.D.   On: 04/24/2017 19:55   Ct Angio Chest Pe W Or Wo Contrast  Result Date: 2017-05-12 CLINICAL DATA:  66 year old male with acute  shortness of breath and cough. EXAM: CT ANGIOGRAPHY CHEST WITH CONTRAST TECHNIQUE: Multidetector CT imaging of the chest was performed using the standard protocol during bolus administration of intravenous contrast. Multiplanar CT image reconstructions and MIPs were obtained to evaluate the vascular anatomy. CONTRAST:  75 cc intravenous Isovue 370 COMPARISON:  04/24/2017 noncontrast CT.  2017/05/12 radiographs. FINDINGS: Cardiovascular: This is a technically adequate study. Bilateral pulmonary emboli are identified involving the majority of the pulmonary segments. RV/LV ratio measures 1.1. Mild cardiomegaly noted. Mild thoracic aortic atherosclerotic calcifications noted without aneurysm. No pericardial effusion Mediastinum/Nodes: No enlarged mediastinal, hilar, or axillary lymph nodes. Thyroid gland, trachea, and esophagus demonstrate no significant findings. Lungs/Pleura: Scattered ground-glass, mild airspace opacities and atelectasis within both lungs noted, greatest in the right upper and left lower lobes. There is no evidence of pulmonary mass, pleural effusion or pneumothorax. Centrilobular emphysema is again identified. Upper Abdomen: No acute abnormality Musculoskeletal: No chest wall abnormality. No acute or significant osseous findings. Review of the MIP images confirms the above findings. IMPRESSION: Positive for acute PE with CT evidence of right heart strain (RV/LV Ratio = 1.1) consistent with at least submassive (intermediate risk) PE. The presence of right heart strain has been associated with an increased risk of morbidity and mortality. Please activate Code PE by paging [phone]. Scattered bilateral ground-glass, mild airspace opacities and atelectasis, likely related to changes from pulmonary emboli. Aortic Atherosclerosis (ICD10-I70.0) and Emphysema (ICD10-J43.9). Critical Value/emergent results were called by telephone at the time of interpretation on 05/12/2017 at 6:56 pm to [facility],  who verbally acknowledged these results. Electronically Signed   By: Harmon Pier M.D.   On: 2017/05/12 19:00   Mr Knee Right Wo Contrast  Result Date: 04/16/2017 CLINICAL DATA:  Recurrent falls over the past week with bilateral knee injuries and pain. EXAM: MRI OF THE RIGHT KNEE WITHOUT CONTRAST TECHNIQUE: Multiplanar, multisequence MR imaging of the knee was performed. No intravenous contrast was administered. COMPARISON:  Plain films of the right knee 04/14/2016. FINDINGS: MENISCI Medial meniscus: The posterior horn is severely degenerated with extensive complex tearing throughout. Horizontal and longitudinal tearing are seen in the body. Lateral meniscus: Severe complex tearing is present throughout a severely degenerated body. The anterior horn is not visualized consistent with maceration. Horizontal tear in the posterior horn is seen. LIGAMENTS Cruciates:  Intact. Collaterals:  Intact. CARTILAGE Patellofemoral: Fissuring and irregularity are seen along the lateral facet. No focal defect. Medial:  Moderately degenerated. Lateral:  Extensively thinned and  irregular. Joint:  Small effusion. Popliteal Fossa:  No Baker's cyst. Extensor Mechanism:  Intact. Bones: No acute bony or joint abnormality. The patient has a mildly depressed remote tibial plateau fracture. Osteophytosis is present about the knee. Other: None. IMPRESSION: Negative for acute bony or joint abnormality. Mildly depressed and remote lateral tibial plateau fracture. Advanced medial and lateral compartment osteoarthritis with associated extensive degeneration and tearing of both the medial and lateral menisci. Electronically Signed   By: Drusilla Kanner M.D.   On: 04/16/2017 11:31   US Venous Img Lower Bilateral  Result Date: 05/04/2017 CLINICAL DATA:  Calf pain. EXAM: BILATERAL LOWER EXTREMITY VENOUS DOPPLER ULTRASOUND TECHNIQUE: Gray-scale sonography with graded compression, as well as color Doppler and duplex ultrasound were performed to  evaluate the lower extremity deep venous systems from the level of the common femoral vein and including the common femoral, femoral, profunda femoral, popliteal and calf veins including the posterior tibial, peroneal and gastrocnemius veins when visible. The superficial great saphenous vein was also interrogated. Spectral Doppler was utilized to evaluate flow at rest and with distal augmentation maneuvers in the common femoral, femoral and popliteal veins. COMPARISON:  Left knee series 6 05/2017.  CT 05/11/2017. FINDINGS: RIGHT LOWER EXTREMITY Common Femoral Vein: No evidence of thrombus. Normal compressibility, respiratory phasicity and response to augmentation. Saphenofemoral Junction: No evidence of thrombus. Normal compressibility and flow on color Doppler imaging. Profunda Femoral Vein: No evidence of thrombus. Normal compressibility and flow on color Doppler imaging. Femoral Vein: No evidence of thrombus. Normal compressibility, respiratory phasicity and response to augmentation. Popliteal Vein: No evidence of thrombus. Normal compressibility, respiratory phasicity and response to augmentation. Calf Veins: No evidence of thrombus. Normal compressibility and flow on color Doppler imaging. Superficial Great Saphenous Vein: No evidence of thrombus. Normal compressibility and flow on color Doppler imaging. Other Findings:  None. LEFT LOWER EXTREMITY Common Femoral Vein: No evidence of thrombus. Normal compressibility, respiratory phasicity and response to augmentation. Saphenofemoral Junction: No evidence of thrombus. Normal compressibility and flow on color Doppler imaging. Profunda Femoral Vein: No evidence of thrombus. Normal compressibility and flow on color Doppler imaging. Femoral Vein: No evidence of thrombus. Normal compressibility, respiratory phasicity and response to augmentation. Popliteal Vein: No evidence of thrombus. Normal compressibility, respiratory phasicity and response to augmentation. Calf  Veins: No evidence of thrombus. Normal compressibility and flow on color Doppler imaging. Superficial Great Saphenous Vein: No evidence of thrombus. Normal compressibility and flow on color Doppler imaging. Other Findings:  None. IMPRESSION: No evidence of DVT within either lower extremity. Electronically Signed   By: Maisie Fus  Register   On: 05/04/2017 10:53   Dg Chest Port 1 View  Result Date: 05/04/2017 CLINICAL DATA:  Respiratory failure. EXAM: PORTABLE CHEST 1 VIEW COMPARISON:  CT 11-May-2017, 04/24/2017. Chest x-ray 05/11/17, 04/24/2017. FINDINGS: Cardiomegaly. Persistent bilateral pulmonary infiltrates/ edema. Small bilateral pleural effusions. No pneumothorax. IMPRESSION: 1. Cardiomegaly. 2. Persistent diffuse bilateral pulmonary infiltrates/edema without significant interim change. Small bilateral pleural effusions. Electronically Signed   By: Maisie Fus  Register   On: 05/04/2017 06:38   Dg Chest Port 1 View  Result Date: 05/11/17 CLINICAL DATA:  Chest pain and shortness of breath since a fall 2 weeks ago. EXAM: PORTABLE CHEST 1 VIEW COMPARISON:  CT scan and chest x-ray dated 04/24/2017 FINDINGS: There has been progression of patchy bilateral pulmonary infiltrates since the prior CT scan. Infiltrates more prominent on the left than the right. Heart size and pulmonary vascularity appear normal. Emphysema was present on the prior CT  scan. IMPRESSION: Progressive bilateral pulmonary infiltrates, left greater than right. Emphysema. Electronically Signed   By: Francene Boyers M.D.   On: May 27, 2017 12:05   Dg Chest Port 1 View  Result Date: 04/18/2017 CLINICAL DATA:  Follow-up examination for respiratory failure. EXAM: PORTABLE CHEST 1 VIEW COMPARISON:  Prior radiograph from 04/17/2017. FINDINGS: Stable cardiomegaly.  Mediastinal silhouette normal. Lungs hypoinflated. Perihilar vascular and interstitial congestion. , progressed from previous. Persistent right upper lobe opacity, slightly worsened. New  left perihilar opacity, which may reflect edema or additional infiltrate. There is a new mild to moderate size right pneumothorax. No mediastinal shift or other complication. Suspected small pleural effusions. No acute osseus abnormality. IMPRESSION: 1. New small moderate right-sided pneumothorax. No mediastinal shift or other complication. 2. Persistent right upper lobe airspace opacity, concerning for pneumonia. 3. Interval worsening in underlying vascular and interstitial congestion. Probable small bilateral pleural effusions. Critical Value/emergent results were called by telephone at the time of interpretation on 04/18/2017 at 6:27 am to Dr. Darcel Bayley , who verbally acknowledged these results. Electronically Signed   By: Rise Mu M.D.   On: 04/18/2017 06:28   Dg Chest Port 1 View  Result Date: 04/17/2017 CLINICAL DATA:  Respiratory failure. EXAM: PORTABLE CHEST 1 VIEW COMPARISON:  11/17/2011 radiographs FINDINGS: The cardiomediastinal silhouette is unremarkable. Right upper lung airspace disease is again noted. This is a low volume film with mild bibasilar atelectasis. Subsegmental atelectasis within the left mid lung noted. There is no evidence of pneumothorax or definite pleural effusion. Remote rib and left clavicle fractures again noted. IMPRESSION: Right upper lung airspace disease likely representing pneumonia. Mild bibasilar and left mid lung atelectasis. Electronically Signed   By: Harmon Pier M.D.   On: 04/17/2017 07:17   Dg Knee Complete 4 Views Left  Result Date: 04/14/2017 CLINICAL DATA:  Fall with knee pain EXAM: LEFT KNEE - COMPLETE 4+ VIEW COMPARISON:  None. FINDINGS: No evidence of fracture, dislocation, or joint effusion. No evidence of arthropathy or other focal bone abnormality. Soft tissues are unremarkable. IMPRESSION: Negative. Electronically Signed   By: Deatra Robinson M.D.   On: 04/14/2017 15:26   Dg Knee Complete 4 Views Right  Result Date: 04/14/2017 CLINICAL DATA:   Multiple falls.  Right knee pain and bruising EXAM: RIGHT KNEE - COMPLETE 4+ VIEW COMPARISON:  None in PACs FINDINGS: The bones are subjectively adequately mineralized. There is mild narrowing of the lateral joint compartment. There is subtle irregularity of the articular surface of the lateral tibial plateau. I cannot exclude an acute or old tibial plateau fracture. The medial and patellofemoral joint spaces are preserved. Spurs arise from the superior and inferior articular margins of the patella and from the tibial spines. There is no joint effusion. There well-marginated lucency seen on the lateral view involving the proximal tibial metaphysis which may reflect previous orthopedic procedure. IMPRESSION: There degenerative changes of the right knee. I cannot exclude subacute or old right lateral tibial plateau fracture with mild partial depression. There no previous studies with which to compare. Further evaluation of the right knee with MRI would be useful if the patient can undergo the procedure. Electronically Signed   By: David  [place] M.D.   On: 04/14/2017 15:16   Dg Foot Complete Right  Result Date: 04/14/2017 CLINICAL DATA:  Right foot pain and bruising. History of multiple falls. EXAM: RIGHT FOOT COMPLETE - 3+ VIEW COMPARISON:  None in PACs FINDINGS: The bones are subjectively adequately mineralized. The phalanges and metatarsals appear intact. There  is narrowing of the first metatarsophalangeal joint space. The tarsal bones are intact. There is a plantar calcaneal spur. The soft tissues exhibit no acute abnormalities. IMPRESSION: There is no acute fracture nor dislocation of the bones of the right foot. 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 Electronically Signed   By: David  [place] M.D.   On: 04/14/2017 15:17    Microbiology Recent Results (from the past 240 hour(s))  Blood Culture (routine x 2)     Status: None (Preliminary result)   Collection Time: 30-May-2017  11:45 AM  Result Value Ref Range Status   Specimen Description BLOOD RIGHT HAND  Final   Special Requests   Final    BOTTLES DRAWN AEROBIC AND ANAEROBIC Blood Culture adequate volume   Culture NO GROWTH 2 DAYS  Final   Report Status PENDING  Incomplete  Blood Culture (routine x 2)     Status: None (Preliminary result)   Collection Time: 30-May-2017 11:52 AM  Result Value Ref Range Status   Specimen Description BLOOD RIGHT ARM   Final   Special Requests   Final    BOTTLES DRAWN AEROBIC AND ANAEROBIC Blood Culture adequate volume   Culture NO GROWTH 2 DAYS  Final   Report Status PENDING  Incomplete  Urine culture     Status: Abnormal   Collection Time: 2017-05-30  1:26 PM  Result Value Ref Range Status   Specimen Description URINE, RANDOM  Final   Special Requests NONE  Final   Culture (A)  Final    <10,000 COLONIES/mL INSIGNIFICANT GROWTH Performed at [facility], [address], [place], [place] [address]    Report Status 05/04/2017 FINAL  Final  Culture, sputum-assessment     Status: None   Collection Time: May 30, 2017  4:50 PM  Result Value Ref Range Status   Specimen Description SPUTUM  Final   Special Requests NONE  Final   Sputum evaluation   Final    Sputum specimen not acceptable for testing.  Please recollect.   SPOKE TO [facility]ILSON 05-30-17 @ 1819 MLK    Report Status 2017-05-30 FINAL  Final  MRSA PCR Screening     Status: None   Collection Time: May 30, 2017  4:50 PM  Result Value Ref Range Status   MRSA by PCR NEGATIVE NEGATIVE Final    Comment:        The GeneXpert MRSA Assay (FDA approved for NASAL specimens only), is one component of a comprehensive MRSA colonization surveillance program. It is not intended to diagnose MRSA infection nor to guide or monitor treatment for MRSA infections.   Culture, blood (x 2)     Status: None (Preliminary result)   Collection Time: 2017-05-30  6:58 PM  Result Value Ref Range Status   Specimen Description BLOOD BLOOD  RIGHT HAND  Final   Special Requests   Final    BOTTLES DRAWN AEROBIC AND ANAEROBIC Blood Culture adequate volume   Culture NO GROWTH 2 DAYS  Final   Report Status PENDING  Incomplete    Lab Basic Metabolic Panel:  Recent Labs Lab May 30, 2017 1136 May 30, 2017 1853 05/04/17 0417  NA 135 138 140  K 3.1* 2.9* 2.8*  CL 106 110 112*  CO2 18* 19* 22  GLUCOSE 160* 90 101*  BUN 9 8 8   CREATININE 1.10 0.98 0.97  CALCIUM 8.4* 7.8* 7.8*  MG  --   --  1.8   Liver Function Tests:  Recent Labs Lab May 30, 2017  1136 September 15, 2017 1853 05/04/17 0417  AST 29 27 22   ALT 38 32 28  ALKPHOS 51 41 41  BILITOT 1.3* 1.2 1.0  PROT 6.3* 5.5* 5.4*  ALBUMIN 3.5 3.0* 2.8*   No results for input(s): LIPASE, AMYLASE in the last 168 hours. No results for input(s): AMMONIA in the last 168 hours. CBC:  Recent Labs Lab September 15, 2017 1136 September 15, 2017 1853 05/04/17 0417  WBC 12.1* 12.5* 9.2  NEUTROABS  --  10.3*  --   HGB 12.9* 12.2* 11.9*  HCT 36.8* 35.2* 34.4*  MCV 94.6 97.5 96.9  PLT 198 171 136*   Cardiac Enzymes:  Recent Labs Lab September 15, 2017 1136  TROPONINI <0.03   Sepsis Labs:  Recent Labs Lab September 15, 2017 1136 September 15, 2017 1145 September 15, 2017 1445 September 15, 2017 1853 05/04/17 0417  PROCALCITON  --   --   --  <0.10 <0.10  WBC 12.1*  --   --  12.5* 9.2  LATICACIDVEN  --  2.8* 3.6* 2.9*  --     Procedures/Operations  None  Jeidy Hoerner S Chantry Headen 04/18/2017, 2:55 AM

## 2017-05-08 NOTE — Progress Notes (Signed)
Patient CTB at 0112 on this date. Daughter, Verlon AuLeslie, present at bedside. Son came in to see patient after he CTB. Once family is ready, funeral home will be called.

## 2017-05-08 NOTE — Progress Notes (Signed)
[facility] called and notified of TOD. Arthur CharsKiran Lavu gave okay to release body at 0144.

## 2017-05-08 NOTE — Progress Notes (Signed)
Family has left hospital, patient prepped and sent to the morgue. Proper protocol followed.

## 2017-05-08 DEATH — deceased

## 2017-05-09 ENCOUNTER — Telehealth: Payer: Self-pay

## 2017-05-09 NOTE — Telephone Encounter (Signed)
Informed [employer] that death cert ready for pick up. Nothing further needed.

## 2017-05-09 NOTE — Telephone Encounter (Signed)
Death cert given to Dr to fill out.

## 2017-05-09 NOTE — Telephone Encounter (Signed)
Received Death Certificate Placed in nurse box  °

## 2017-06-09 ENCOUNTER — Inpatient Hospital Stay: Payer: Medicare Other | Admitting: Internal Medicine

## 2018-04-30 IMAGING — DX DG CHEST 1V
1 series · 1 of 1 positions shown · non-contrast
Comparison: April 21, 2017

CLINICAL DATA: Acute shortness of breath.

EXAM:
CHEST 1 VIEW

[chest ap]
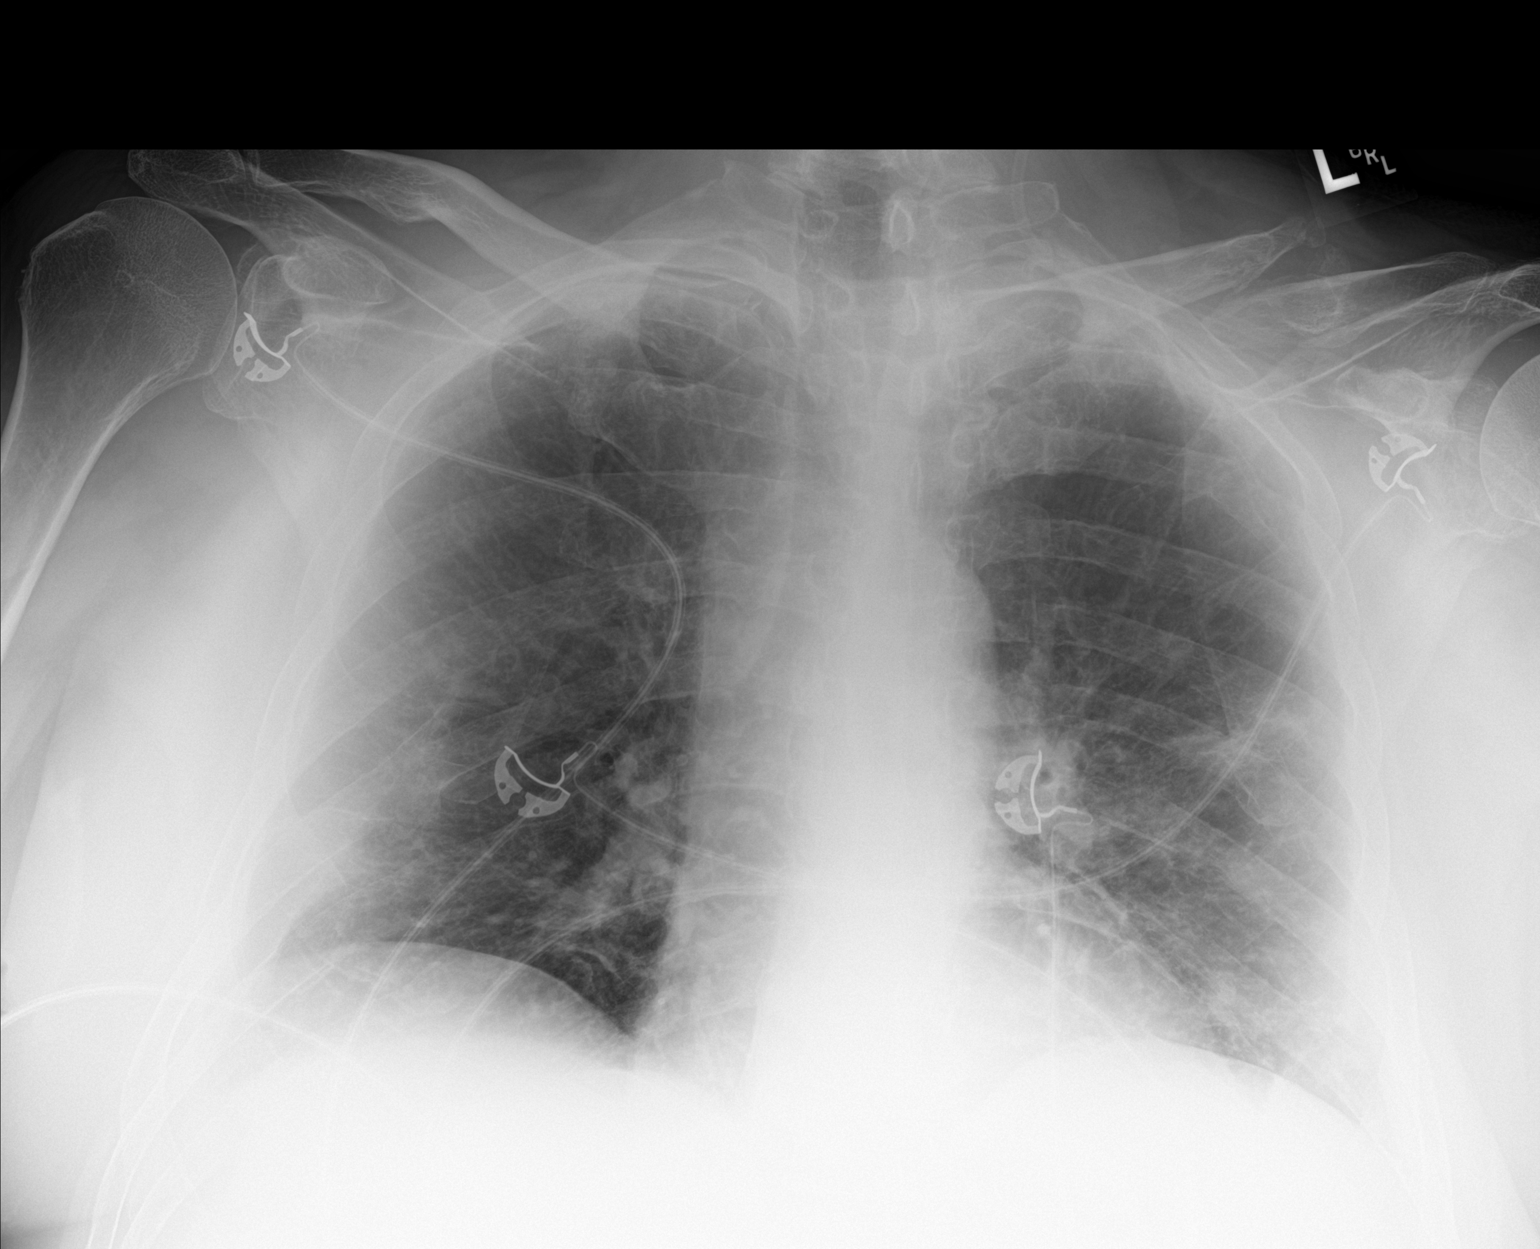

[1 of 1 positions shown; findings below may reference images not displayed]

FINDINGS: Focal infiltrate in the left mid lung and perhaps the left base.
Increasing haziness over the lateral right lung base with tube
removal in the interval. The cardiomediastinal silhouette is stable.
No other changes.
IMPRESSION: 1. Developing infiltrate in the left mid and lower lung. Recommend
follow-up to resolution.
2. Possible developing effusion on the right.

## 2018-04-30 IMAGING — CT CT CHEST W/O CM
2 of 4 series · 14 of 36 positions shown, 17 images · non-contrast
Comparison: Chest radiograph 04/24/2017

CLINICAL DATA: Hypertension.  Abdominal cramping.

EXAM:
CT CHEST, ABDOMEN AND PELVIS WITHOUT CONTRAST
TECHNIQUE: Multidetector CT imaging of the chest, abdomen and pelvis was
performed following the standard protocol without IV contrast.

[Series 2: cap wo st · axial · 0.95mm/px · z∈[-1232,-662]mm · 11 of 138 slices shown, 14 images]
[im 12/138  mediastinal]
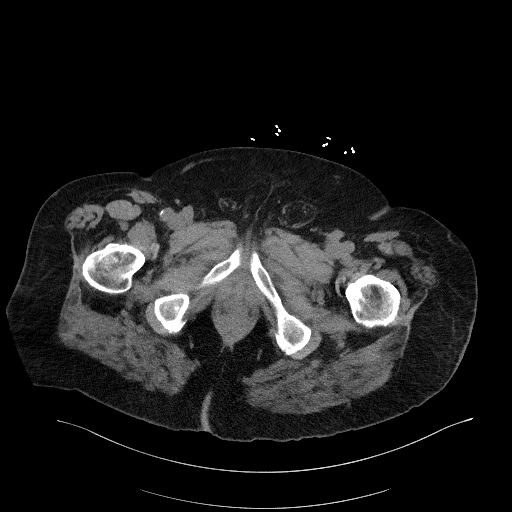
[im 12/138  lung]
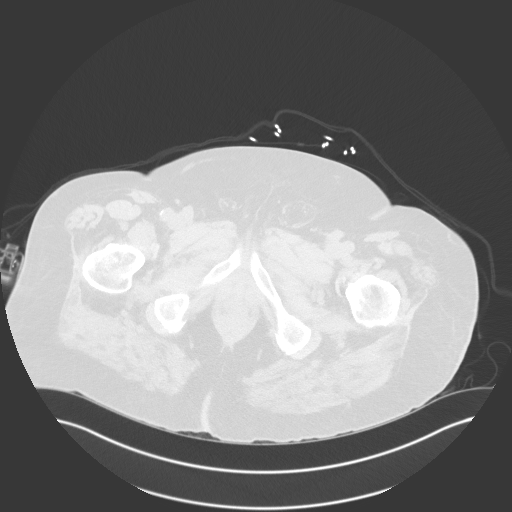
[im 23/138  lung]
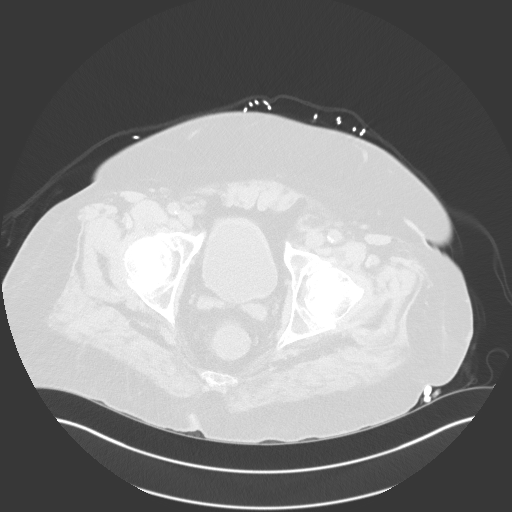
[im 35/138  lung]
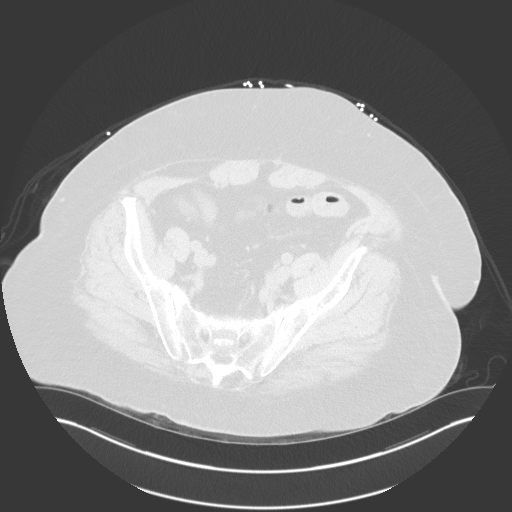
[im 46/138  lung]
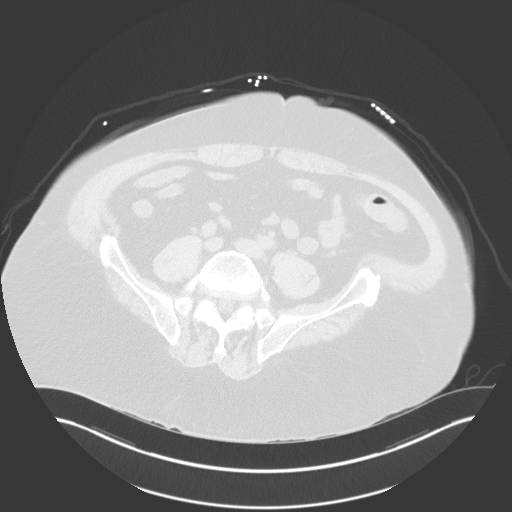
[im 58/138  mediastinal]
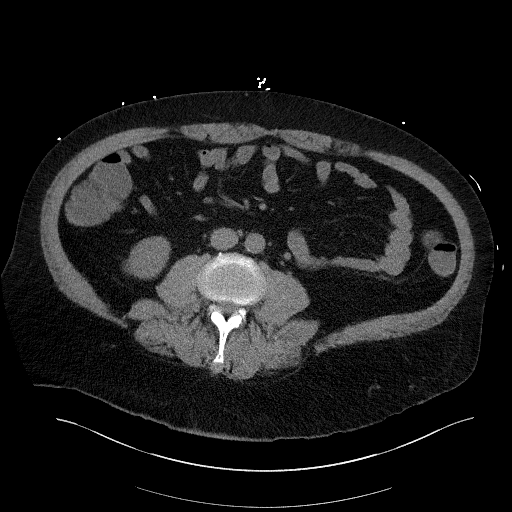
[im 58/138  lung]
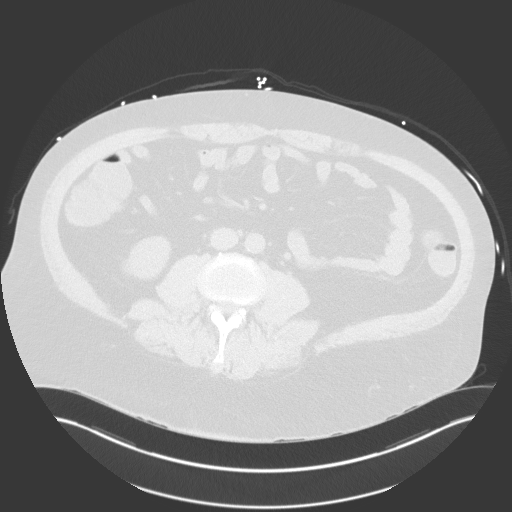
[im 69/138  lung]
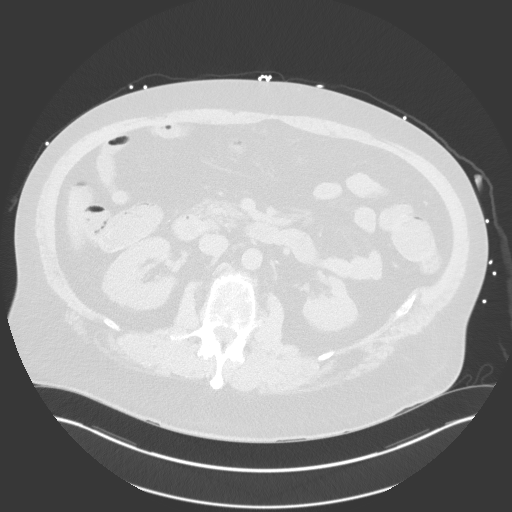
[im 80/138  lung]
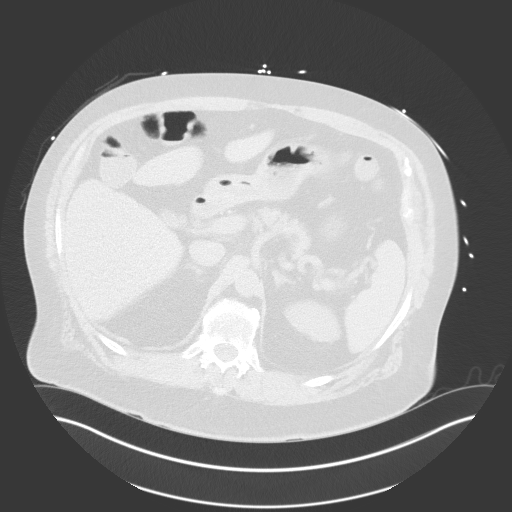
[im 92/138  lung]
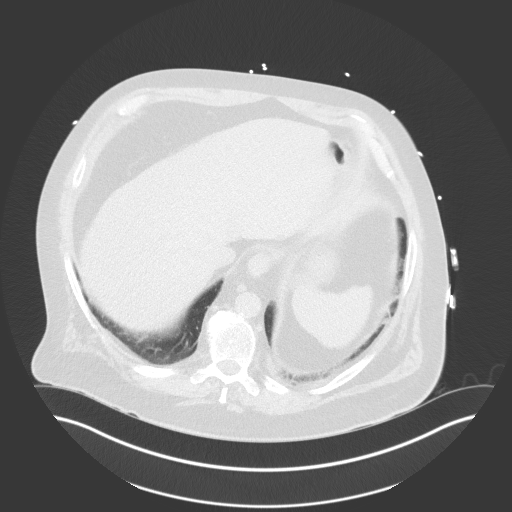
[im 103/138  mediastinal]
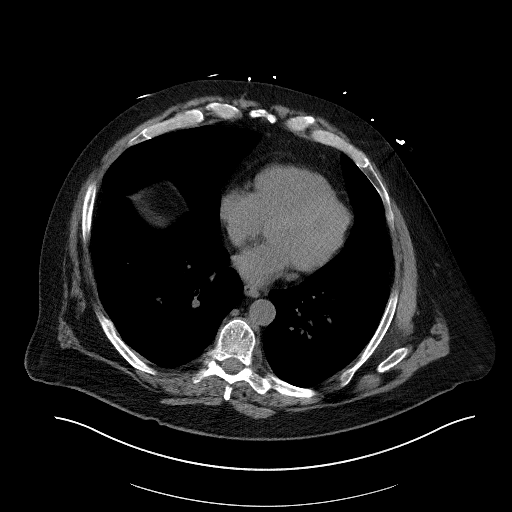
[im 103/138  lung]
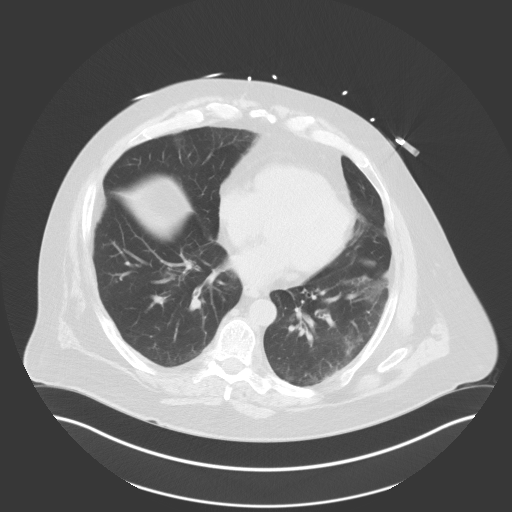
[im 115/138  lung]
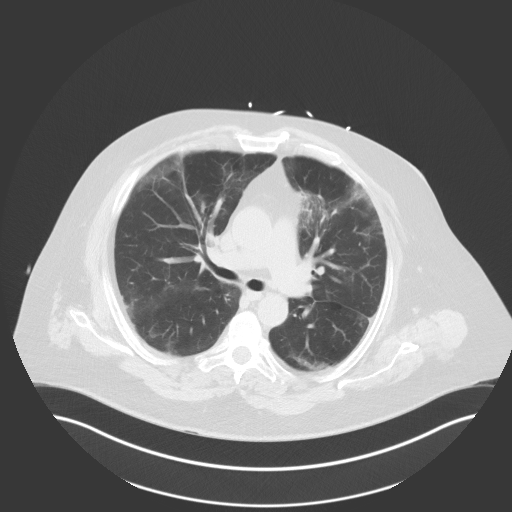
[im 126/138  lung]
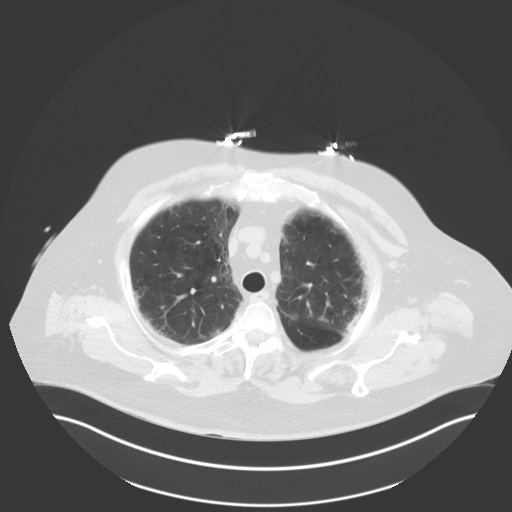

[Series 5: coronal · coronal · 0.88mm/px · 3 of 171 slices shown]
[im 35/171  lung]
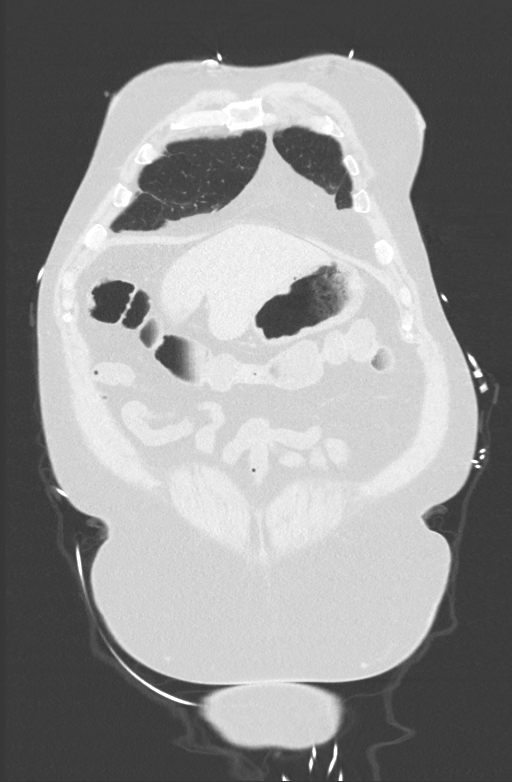
[im 69/171  lung]
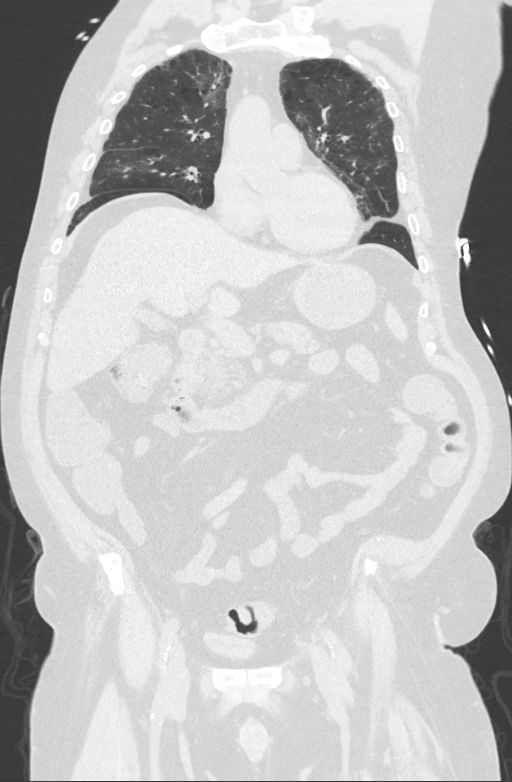
[im 103/171  lung]
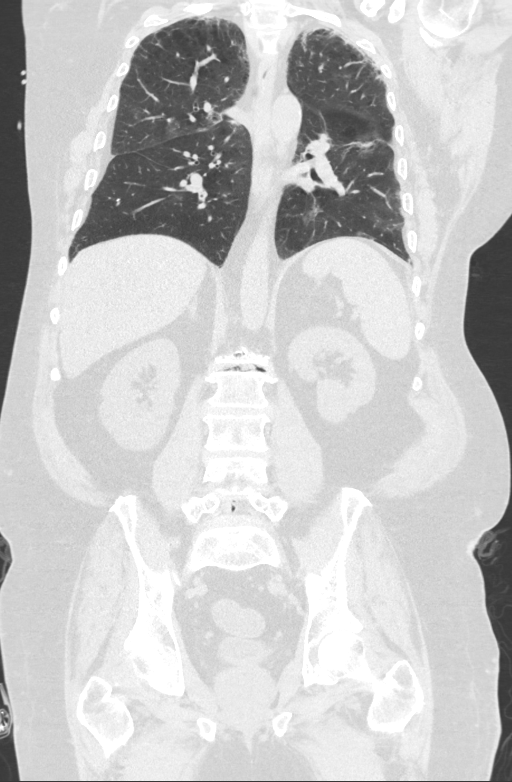

[14 of 36 positions shown; findings below may reference images not displayed]

FINDINGS: CT CHEST FINDINGS

Cardiovascular: Normal caliber of the great vessels. Minimal
calcific atherosclerotic disease of the aorta. Normal heart size. No
pericardial effusion.

Mediastinum/Nodes: No enlarged mediastinal, hilar, or axillary lymph
nodes. Thyroid gland, trachea, and esophagus demonstrate no
significant findings.

Lungs/Pleura: Upper lobe predominant emphysema. Peripheral linear
peribronchial opacities and mild bronchiectasis, likely represent
chronic interstitial lung disease patchy ground-glass opacities also
and peripheral predominance are present in both lungs. Right middle
lobe volume loss .

Musculoskeletal: Multilevel osteoarthritic changes.

CT ABDOMEN PELVIS FINDINGS

Hepatobiliary: Subcapsular area of hypoattenuation adjacent to the
intersegmental fissure in the left lobe of the liver, likely
represents focal fatty infiltration. No gallstones, gallbladder wall
thickening, or biliary dilatation.

Pancreas: Unremarkable. No pancreatic ductal dilatation or
surrounding inflammatory changes.

Spleen: Normal in size without focal abnormality.

Adrenals/Urinary Tract: Adrenal glands are unremarkable. Kidneys are
normal, without renal calculi, focal lesion, or hydronephrosis.
Bladder is unremarkable.

Stomach/Bowel: Stomach is within normal limits. No evidence of bowel
wall thickening, distention, or inflammatory changes. Gas fluid
levels within the transverse and descending colon, likely due to
diarrheal state.

Vascular/Lymphatic: Minimal aortic atherosclerosis. No enlarged
abdominal or pelvic lymph nodes.

Reproductive: Heterogeneous appearance of the prostate gland.

Other: No abdominal wall hernia or abnormality. No abdominopelvic
ascites.

Musculoskeletal: Multilevel osteoarthritic changes of the spine.
Bilateral L5 pars articularis defects. Minimal posterior listhesis
of L4 on L5.
IMPRESSION: Upper lobe predominant emphysema.

Chronic interstitial lung disease with bronchiectasis, fibrotic
changes and volume loss.

Superimposed ground-glass opacities in predominantly peripheral
distribution may represent an element of acute infectious/
inflammatory changes on the background of chronic interstitial lung
disease.

Minimal calcific atherosclerotic disease of the aorta.

No acute abnormalities within the abdomen.

Heterogeneous appearance of the prostate gland. Please correlate to
patient's PSA values.

## 2018-05-10 IMAGING — DX DG CHEST 1V PORT
1 series · 1 of 1 positions shown · non-contrast
Comparison: CT 05/03/2017, 04/24/2017. Chest x-ray 05/03/2017,
04/24/2017.

CLINICAL DATA: Respiratory failure.

EXAM:
PORTABLE CHEST 1 VIEW

[chest ap]
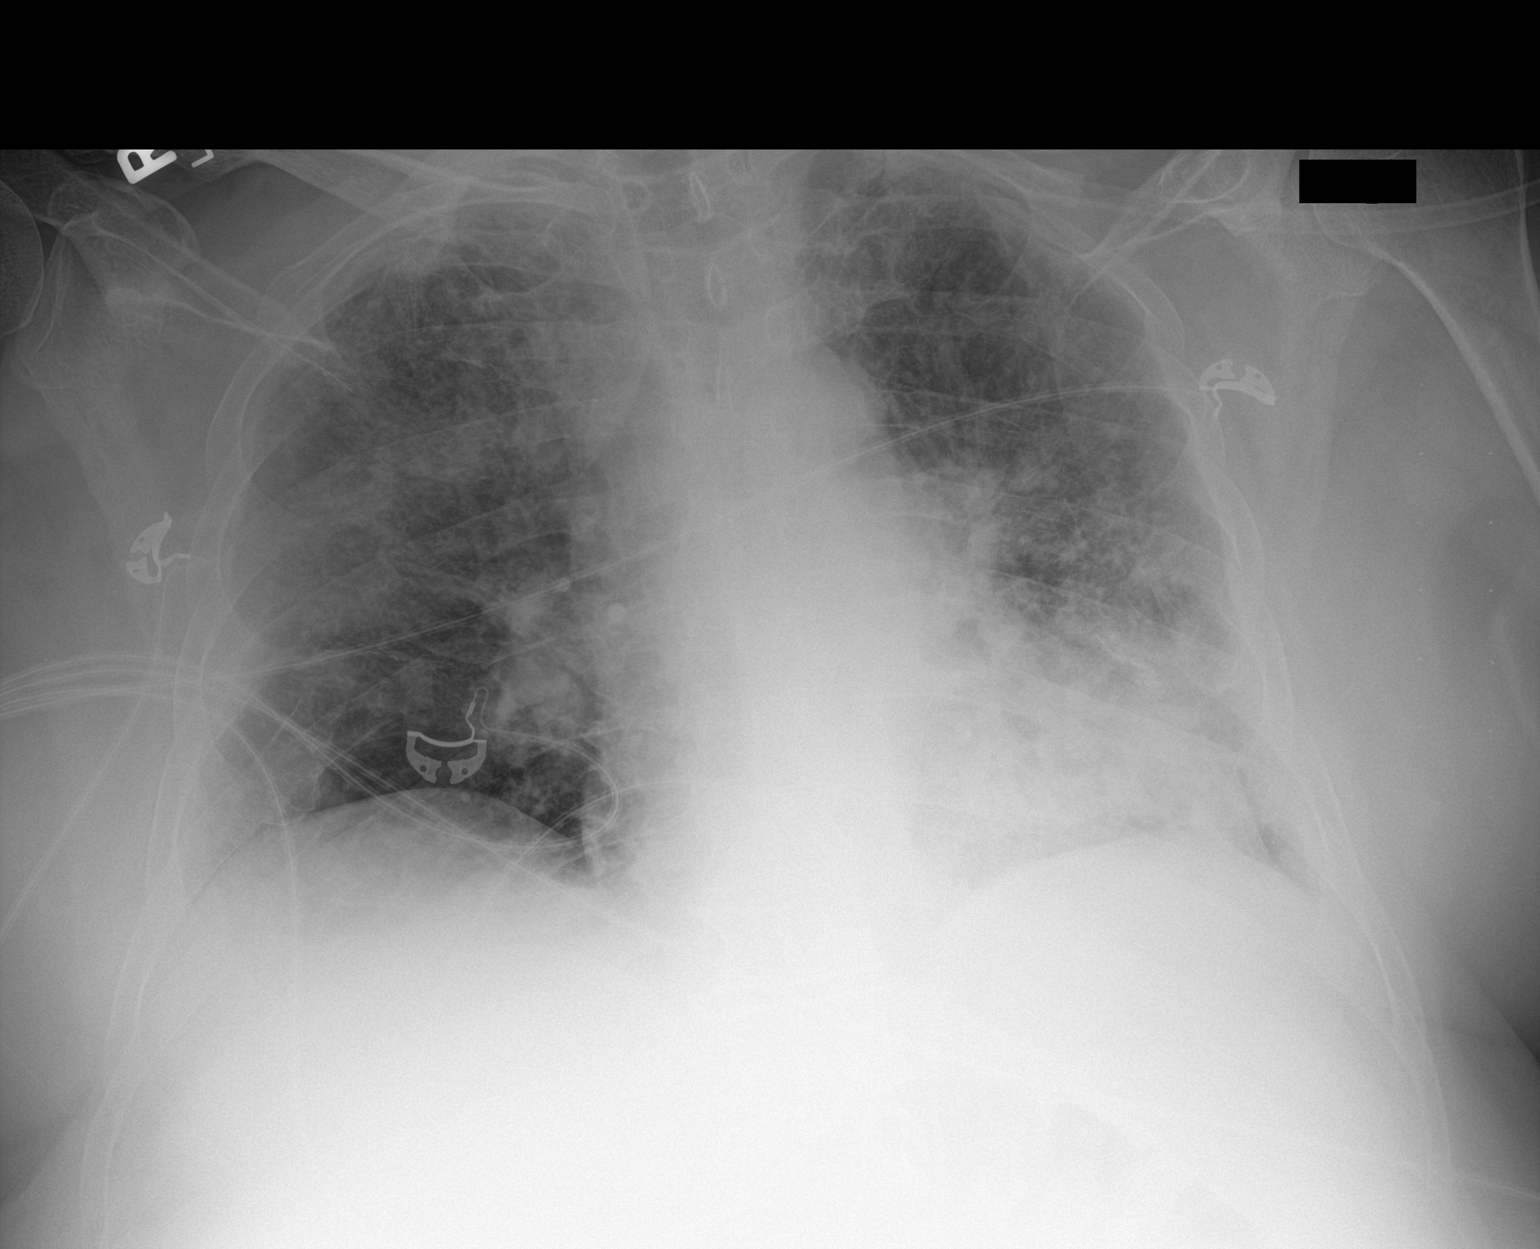

[1 of 1 positions shown; findings below may reference images not displayed]

FINDINGS: Cardiomegaly. Persistent bilateral pulmonary infiltrates/ edema.
Small bilateral pleural effusions. No pneumothorax.
IMPRESSION: 1. Cardiomegaly.

2. Persistent diffuse bilateral pulmonary infiltrates/edema without
significant interim change. Small bilateral pleural effusions.
